# Patient Record
Sex: Female | Born: 1955
Health system: Southern US, Community
[De-identification: ages and names within clinical notes are randomized; demographics above are authoritative.]

## PROBLEM LIST (undated history)

## (undated) DIAGNOSIS — G43909 Migraine, unspecified, not intractable, without status migrainosus: Secondary | ICD-10-CM

## (undated) DIAGNOSIS — K529 Noninfective gastroenteritis and colitis, unspecified: Secondary | ICD-10-CM

## (undated) DIAGNOSIS — I10 Essential (primary) hypertension: Secondary | ICD-10-CM

## (undated) DIAGNOSIS — K5792 Diverticulitis of intestine, part unspecified, without perforation or abscess without bleeding: Secondary | ICD-10-CM

## (undated) DIAGNOSIS — D126 Benign neoplasm of colon, unspecified: Secondary | ICD-10-CM

## (undated) DIAGNOSIS — K5790 Diverticulosis of intestine, part unspecified, without perforation or abscess without bleeding: Secondary | ICD-10-CM

## (undated) DIAGNOSIS — K219 Gastro-esophageal reflux disease without esophagitis: Secondary | ICD-10-CM

## (undated) DIAGNOSIS — E049 Nontoxic goiter, unspecified: Secondary | ICD-10-CM

## (undated) DIAGNOSIS — D649 Anemia, unspecified: Secondary | ICD-10-CM

## (undated) DIAGNOSIS — F419 Anxiety disorder, unspecified: Secondary | ICD-10-CM

## (undated) DIAGNOSIS — F32A Depression, unspecified: Secondary | ICD-10-CM

## (undated) DIAGNOSIS — E042 Nontoxic multinodular goiter: Secondary | ICD-10-CM

## (undated) DIAGNOSIS — M7070 Other bursitis of hip, unspecified hip: Secondary | ICD-10-CM

## (undated) DIAGNOSIS — IMO0002 Reserved for concepts with insufficient information to code with codable children: Secondary | ICD-10-CM

## (undated) DIAGNOSIS — E039 Hypothyroidism, unspecified: Secondary | ICD-10-CM

## (undated) DIAGNOSIS — N390 Urinary tract infection, site not specified: Secondary | ICD-10-CM

## (undated) DIAGNOSIS — M8580 Other specified disorders of bone density and structure, unspecified site: Secondary | ICD-10-CM

## (undated) DIAGNOSIS — G5600 Carpal tunnel syndrome, unspecified upper limb: Secondary | ICD-10-CM

## (undated) DIAGNOSIS — M1990 Unspecified osteoarthritis, unspecified site: Secondary | ICD-10-CM

## (undated) DIAGNOSIS — Z8489 Family history of other specified conditions: Secondary | ICD-10-CM

## (undated) HISTORY — PX: TUBAL LIGATION: SHX77

## (undated) HISTORY — DX: Other specified disorders of bone density and structure, unspecified site: M85.80

## (undated) HISTORY — DX: Carpal tunnel syndrome, unspecified upper limb: G56.00

## (undated) HISTORY — DX: Other bursitis of hip, unspecified hip: M70.70

## (undated) HISTORY — PX: POLYPECTOMY: SHX149

## (undated) HISTORY — PX: COLONOSCOPY: SHX5424

## (undated) HISTORY — DX: Noninfective gastroenteritis and colitis, unspecified: K52.9

## (undated) HISTORY — DX: Benign neoplasm of colon, unspecified: D12.6

## (undated) HISTORY — DX: Nontoxic goiter, unspecified: E04.9

## (undated) HISTORY — DX: Hypothyroidism, unspecified: E03.9

## (undated) HISTORY — PX: CAUTERY OF TURBINATES: SHX1315

## (undated) HISTORY — DX: Anemia, unspecified: D64.9

## (undated) HISTORY — DX: Migraine, unspecified, not intractable, without status migrainosus: G43.909

## (undated) HISTORY — DX: Unspecified osteoarthritis, unspecified site: M19.90

## (undated) HISTORY — DX: Nontoxic multinodular goiter: E04.2

## (undated) HISTORY — PX: EXCISIONAL HEMORRHOIDECTOMY: SHX1541

## (undated) HISTORY — DX: Essential (primary) hypertension: I10

## (undated) HISTORY — PX: COLONOSCOPY: SHX174

## (undated) HISTORY — DX: Diverticulosis of intestine, part unspecified, without perforation or abscess without bleeding: K57.90

---

## 1989-09-10 HISTORY — PX: NASAL SEPTUM SURGERY: SHX37

## 1997-12-07 ENCOUNTER — Other Ambulatory Visit: Admission: RE | Admit: 1997-12-07 | Discharge: 1997-12-07 | Payer: Self-pay | Admitting: Obstetrics and Gynecology

## 1999-01-09 ENCOUNTER — Other Ambulatory Visit: Admission: RE | Admit: 1999-01-09 | Discharge: 1999-01-09 | Payer: Self-pay | Admitting: Obstetrics and Gynecology

## 2000-05-07 ENCOUNTER — Other Ambulatory Visit: Admission: RE | Admit: 2000-05-07 | Discharge: 2000-05-07 | Payer: Self-pay | Admitting: Obstetrics and Gynecology

## 2000-12-26 ENCOUNTER — Ambulatory Visit (HOSPITAL_COMMUNITY): Admission: RE | Admit: 2000-12-26 | Discharge: 2000-12-26 | Payer: Self-pay | Admitting: Obstetrics and Gynecology

## 2000-12-26 ENCOUNTER — Encounter: Payer: Self-pay | Admitting: Obstetrics and Gynecology

## 2001-01-02 ENCOUNTER — Encounter: Payer: Self-pay | Admitting: Obstetrics and Gynecology

## 2001-01-02 ENCOUNTER — Encounter: Admission: RE | Admit: 2001-01-02 | Discharge: 2001-01-02 | Payer: Self-pay | Admitting: Obstetrics and Gynecology

## 2001-09-10 HISTORY — PX: TOTAL ABDOMINAL HYSTERECTOMY: SHX209

## 2002-01-07 ENCOUNTER — Encounter: Payer: Self-pay | Admitting: Obstetrics and Gynecology

## 2002-01-07 ENCOUNTER — Ambulatory Visit (HOSPITAL_COMMUNITY): Admission: RE | Admit: 2002-01-07 | Discharge: 2002-01-07 | Payer: Self-pay | Admitting: Obstetrics and Gynecology

## 2002-12-21 ENCOUNTER — Other Ambulatory Visit: Admission: RE | Admit: 2002-12-21 | Discharge: 2002-12-21 | Payer: Self-pay | Admitting: Obstetrics and Gynecology

## 2003-02-01 ENCOUNTER — Ambulatory Visit (HOSPITAL_COMMUNITY): Admission: RE | Admit: 2003-02-01 | Discharge: 2003-02-01 | Payer: Self-pay | Admitting: Obstetrics and Gynecology

## 2003-02-01 ENCOUNTER — Encounter (INDEPENDENT_AMBULATORY_CARE_PROVIDER_SITE_OTHER): Payer: Self-pay

## 2003-04-08 ENCOUNTER — Encounter: Admission: RE | Admit: 2003-04-08 | Discharge: 2003-04-08 | Payer: Self-pay | Admitting: Otolaryngology

## 2003-04-08 ENCOUNTER — Encounter: Payer: Self-pay | Admitting: Otolaryngology

## 2003-12-10 ENCOUNTER — Encounter: Admission: RE | Admit: 2003-12-10 | Discharge: 2003-12-10 | Payer: Self-pay | Admitting: Obstetrics and Gynecology

## 2004-10-11 ENCOUNTER — Inpatient Hospital Stay (HOSPITAL_COMMUNITY): Admission: RE | Admit: 2004-10-11 | Discharge: 2004-10-14 | Payer: Self-pay | Admitting: Obstetrics and Gynecology

## 2004-10-11 ENCOUNTER — Encounter (INDEPENDENT_AMBULATORY_CARE_PROVIDER_SITE_OTHER): Payer: Self-pay | Admitting: *Deleted

## 2005-09-13 ENCOUNTER — Ambulatory Visit: Payer: Self-pay | Admitting: Gastroenterology

## 2005-09-27 ENCOUNTER — Encounter (INDEPENDENT_AMBULATORY_CARE_PROVIDER_SITE_OTHER): Payer: Self-pay | Admitting: *Deleted

## 2005-09-27 ENCOUNTER — Ambulatory Visit: Payer: Self-pay | Admitting: Gastroenterology

## 2005-10-23 ENCOUNTER — Ambulatory Visit (HOSPITAL_COMMUNITY): Admission: RE | Admit: 2005-10-23 | Discharge: 2005-10-23 | Payer: Self-pay | Admitting: Obstetrics and Gynecology

## 2006-10-08 ENCOUNTER — Ambulatory Visit (HOSPITAL_COMMUNITY): Admission: RE | Admit: 2006-10-08 | Discharge: 2006-10-08 | Payer: Self-pay | Admitting: Obstetrics and Gynecology

## 2006-10-30 ENCOUNTER — Ambulatory Visit (HOSPITAL_COMMUNITY): Admission: RE | Admit: 2006-10-30 | Discharge: 2006-10-30 | Payer: Self-pay | Admitting: Obstetrics and Gynecology

## 2006-11-21 ENCOUNTER — Encounter (INDEPENDENT_AMBULATORY_CARE_PROVIDER_SITE_OTHER): Payer: Self-pay | Admitting: Specialist

## 2006-11-21 ENCOUNTER — Other Ambulatory Visit: Admission: RE | Admit: 2006-11-21 | Discharge: 2006-11-21 | Payer: Self-pay | Admitting: Interventional Radiology

## 2006-11-21 ENCOUNTER — Encounter: Admission: RE | Admit: 2006-11-21 | Discharge: 2006-11-21 | Payer: Self-pay | Admitting: Surgery

## 2008-04-09 ENCOUNTER — Ambulatory Visit (HOSPITAL_COMMUNITY): Admission: RE | Admit: 2008-04-09 | Discharge: 2008-04-09 | Payer: Self-pay | Admitting: Obstetrics and Gynecology

## 2008-04-20 ENCOUNTER — Encounter: Admission: RE | Admit: 2008-04-20 | Discharge: 2008-04-20 | Payer: Self-pay | Admitting: Obstetrics and Gynecology

## 2009-01-14 ENCOUNTER — Telehealth: Payer: Self-pay | Admitting: Gastroenterology

## 2009-04-10 HISTORY — PX: PARTIAL KNEE ARTHROPLASTY: SHX2174

## 2009-05-02 ENCOUNTER — Inpatient Hospital Stay (HOSPITAL_COMMUNITY): Admission: RE | Admit: 2009-05-02 | Discharge: 2009-05-04 | Payer: Self-pay | Admitting: Orthopedic Surgery

## 2009-05-02 ENCOUNTER — Other Ambulatory Visit: Payer: Self-pay | Admitting: Orthopedic Surgery

## 2009-05-09 ENCOUNTER — Encounter (INDEPENDENT_AMBULATORY_CARE_PROVIDER_SITE_OTHER): Payer: Self-pay | Admitting: *Deleted

## 2009-05-26 ENCOUNTER — Ambulatory Visit: Payer: Self-pay | Admitting: Gastroenterology

## 2009-05-26 DIAGNOSIS — K219 Gastro-esophageal reflux disease without esophagitis: Secondary | ICD-10-CM | POA: Insufficient documentation

## 2009-05-31 ENCOUNTER — Encounter: Admission: RE | Admit: 2009-05-31 | Discharge: 2009-07-27 | Payer: Self-pay | Admitting: Orthopedic Surgery

## 2009-06-01 ENCOUNTER — Ambulatory Visit: Admission: RE | Admit: 2009-06-01 | Discharge: 2009-06-01 | Payer: Self-pay | Admitting: Orthopedic Surgery

## 2009-06-01 ENCOUNTER — Encounter (INDEPENDENT_AMBULATORY_CARE_PROVIDER_SITE_OTHER): Payer: Self-pay | Admitting: Orthopedic Surgery

## 2009-06-01 ENCOUNTER — Ambulatory Visit: Payer: Self-pay | Admitting: Vascular Surgery

## 2009-08-08 ENCOUNTER — Encounter: Admission: RE | Admit: 2009-08-08 | Discharge: 2009-08-08 | Payer: Self-pay | Admitting: Orthopedic Surgery

## 2009-10-25 ENCOUNTER — Encounter: Admission: RE | Admit: 2009-10-25 | Discharge: 2009-10-25 | Payer: Self-pay | Admitting: Obstetrics and Gynecology

## 2010-03-30 ENCOUNTER — Encounter: Admission: RE | Admit: 2010-03-30 | Discharge: 2010-03-30 | Payer: Self-pay | Admitting: Family Medicine

## 2010-10-01 ENCOUNTER — Encounter: Payer: Self-pay | Admitting: Obstetrics and Gynecology

## 2010-11-09 HISTORY — PX: KNEE ARTHROSCOPY: SUR90

## 2010-12-16 LAB — BASIC METABOLIC PANEL
BUN: 21 mg/dL (ref 6–23)
CO2: 33 mEq/L — ABNORMAL HIGH (ref 19–32)
Calcium: 9.4 mg/dL (ref 8.4–10.5)
GFR calc Af Amer: >60 mL/min (ref >60)
GFR calc non Af Amer: >60 mL/min (ref >60)
Glucose, Bld: 123 mg/dL — ABNORMAL HIGH (ref 70–99)
Glucose, Bld: 99 mg/dL (ref 70–99)
Potassium: 4.1 mEq/L (ref 3.5–5.1)
Sodium: 139 mEq/L (ref 135–145)
Sodium: 141 mEq/L (ref 135–145)

## 2010-12-16 LAB — CBC
HCT: 36.3 % (ref 36.0–46.0)
MCHC: 33.5 g/dL (ref 30.0–36.0)
MCV: 92 fL (ref 78.0–100.0)
Platelets: 204 10*3/uL (ref 150–400)
Platelets: 269 10*3/uL (ref 150–400)
RBC: 4.49 MIL/uL (ref 3.87–5.11)
RDW: 13.8 % (ref 11.5–15.5)
WBC: 7.6 10*3/uL (ref 4.0–10.5)

## 2010-12-16 LAB — TYPE AND SCREEN
ABO/RH(D): A POS
Antibody Screen: NEGATIVE

## 2010-12-16 LAB — DIFFERENTIAL
Basophils Absolute: 0 10*3/uL (ref 0.0–0.1)
Basophils Relative: 0 % (ref 0–1)
Eosinophils Absolute: 0.2 10*3/uL (ref 0.0–0.7)
Eosinophils Relative: 3 % (ref 0–5)
Lymphocytes Relative: 26 % (ref 12–46)
Lymphs Abs: 1.9 10*3/uL (ref 0.7–4.0)
Monocytes Relative: 6 % (ref 3–12)

## 2010-12-16 LAB — URINALYSIS, ROUTINE W REFLEX MICROSCOPIC
Glucose, UA: NEGATIVE mg/dL
Nitrite: NEGATIVE

## 2010-12-16 LAB — URINE MICROSCOPIC-ADD ON

## 2010-12-16 LAB — PROTIME-INR: INR: 1 (ref 0.00–1.49)

## 2011-01-23 NOTE — H&P (Signed)
[name], [name]                ACCOUNT NO.:  [id number]   MEDICAL RECORD NO.:  [id number]          PATIENT TYPE:  INP   LOCATION:                               FACILITY:  [facility]   PHYSICIAN:  Madlyn Frankel. Charlann Boxer, M.D.  DATE OF BIRTH:  May 23, 1956   DATE OF ADMISSION:  12/20/2008  DATE OF DISCHARGE:                              HISTORY & PHYSICAL   PROCEDURE:  A left unicondylar knee replacement of the medial  compartment.   CHIEF COMPLAINT:  Left medial knee pain.   HISTORY OF PRESENT ILLNESS:  A 55 year old female with a history of left  medial knee pain secondary to medial compartment osteoarthritis.  It has  been refractory to all conservative treatment including oral anti-  inflammatories and cortisone injection.   PRIMARY CARE PHYSICIAN:  Dr. Laurann Montana.   OTHER PHYSICIANS:  1. Dr. Rudene Christians for neurology and headaches,  2. Dr. Haroldine Laws, ENT.   PAST MEDICAL HISTORY:  1. Osteoarthritis.  2. Migraines.  3. Reflux disease.  4. Degenerative disk disease.   PAST SURGERIES:  1. Sinus surgery.  2. Hysterectomy.   FAMILY HISTORY:  Pulmonary embolism, heart disease, hypothyroidism,  cancer.   SOCIAL HISTORY:  Married.  Primary caregiver will be family in the home  postoperatively.  She is a Doctor, general practice.   DRUG ALLERGIES:  NO KNOWN DRUG ALLERGIES.   MEDICATIONS:  1. Excedrin p.r.n.  2. Celebrex 200 mg one p.o. b.i.d. x2 weeks after surgery.   REVIEW OF SYSTEMS:  See HPI.   PHYSICAL EXAMINATION:  VITAL SIGNS:  Pulse 72, respirations 16, blood  pressure 132/84.  GENERAL:  Awake, alert and oriented.  HEENT:  Normocephalic.  NECK:  Supple.  No carotid bruits.  CHEST:  Lungs clear to auscultation bilaterally.  BREASTS:  Deferred.  HEART:  S1-S2 distinct.  ABDOMEN:  Soft, nontender, bowel sounds present.  PELVIS:  Stable.  GENITOURINARY:  Deferred.  EXTREMITIES:  Left medial knee pain with recurvatum.  SKIN:  No cellulitis left lower extremity.  NEUROLOGIC:  Intact distal sensibilities.   LABORATORY DATA:  Labs, EKG, chest x-ray all pending presurgical  testing.   IMPRESSION:  Left medial knee osteoarthritis.   PLAN OF ACTION:  A left unicondylar knee replacement of the medial  compartment by Dr. Durene Romans at [facility] on December 20, 2008.  Risks and complications were discussed.   Postoperative medications were included today including aspirin for DVT  prophylaxis.     ______________________________  [name], [place]      Madlyn Frankel. Charlann Boxer, M.D.  Electronically Signed    BLM/MEDQ  D:  11/29/2008  T:  11/29/2008  Job:  [id number]   cc:   [name]. [name], M.D.  Fax: [phone]   [name]. [name], M.D.  Fax: [phone]

## 2011-01-23 NOTE — Op Note (Signed)
NAMEVIVIA, ROSENBURG                ACCOUNT NO.:  [id number]   MEDICAL RECORD NO.:  [id number]          PATIENT TYPE:  INP   LOCATION:  0004                         FACILITY:  [facility]   PHYSICIAN:  Madlyn Frankel. Charlann Boxer, M.D.  DATE OF BIRTH:  [birth year]   DATE OF PROCEDURE:  05/02/2009  DATE OF DISCHARGE:                               OPERATIVE REPORT   PREOPERATIVE DIAGNOSIS:  Left knee medial compartment osteoarthritis.   POSTOPERATIVE DIAGNOSIS:  Left knee medial compartment osteoarthritis.   PROCEDURE:  Left partial knee replacement utilizing a Biomet Oxford knee  system with a size medium femur and a left medial tibial tray with a  size 5 insert.   SURGEON:  Madlyn Frankel. Charlann Boxer, M.D.   ASSISTANT:  Dwyane Luo, PA-C   ANESTHESIA:  Spinal.   DRAINS:  One Hemovac.   COMPLICATIONS:  None.   TOURNIQUET TIME:  44 minutes at 250 mmHg.   INDICATIONS FOR PROCEDURE:  Ms. Sonn is a 55 year old female who  presented office for evaluation of bilateral knee pain, left greater  than right with radiographs revealing end-stage bone on bone changes  medially and her symptoms match this.  We discussed treatment options  since she has failed to respond conservative measures.  Option of medial  compartment arthroplasty was discussed, risks and benefits, pros and  cons discussed comparing this between the total versus partial knee  replacement.  Consent was obtained for the benefit of pain relief.   PROCEDURE IN DETAIL:  The patient was brought to the operative theater.  Once adequate anesthesia, preoperative antibiotics, Ancef administered,  the patient was positioned supine with a proximal thigh tourniquet  placed on the left leg.  Left lower extremity was positioned over the  Oxford leg holder to allow for 120 degrees of flexion.   I then pre scrubbed, prepped and draped the left lower extremity.  Time-  out was performed identifying the patient, planned procedure and  extremity.   Leg was  exsanguinated, tourniquet elevated to 250 mmHg.  A midline  incision was made just off midline.  Following initial soft tissue  debridement creation of soft tissue planes, a median arthrotomy was  made.   The patella subluxated laterally, I removed osteophytes on the distal  femur and medially as well as on the proximal tibia medially and into  the notch.  Synovectomy carried out.  Attention was first directed to  the tibia using extramedullary guide positioned just underneath the  osteophytic ridge anteriorly.  I then made a reciprocating saw followed  by an oscillating saw cut.  The cut surface seemed to fit best with a  size A tibial tray even after removing osteophytes.   With this in place and retractors removed, the 4 feeler gauge felt like  it had good tension.  Further meniscus was removed at this point.   Attention was now directed to the femur.  Femoral canal was opened with  the drill and we used an awl to open up the hole a bit more.  An  intramedullary rod was then passed.  With a size 3  feeler gauge in place  on top of the tibial tray, the posterior cutting block guide was  inserted and once it was parallel to the intramedullary rod in all  planes and centered within the middle third of the medial femoral  condyle, drill holes were made.  This was then exchanged out for the  posterior cutting block.  The posterior cut was made.   At this point, based on fact that the 4 feeler gauge felt good and from  experience, I went ahead and placed a 4 spigot.  Then the distal femur  was milled, bone was removed, osteophytes removed.  Trial reduction was  now carried out with a medium femur, A tibia and a 4-mm insert.  Both in  extension and flexion it felt just a little loose, I went ahead and  trialed with a 5.  This felt much more stable without excessive  tightening.  Thus the ligaments appeared be stable from extension and  flexion with this insert.  At this point, the trial  components removed.  I held the tibial tray in place, pinned it in position and used a  reciprocating saw to create the trough for the keel.  I then removed  remaining bone with a specific osteotome.   A keeled trial reduction was now carried out with an A tibia and medium  femur.   Using the lollipop trials, the size 5 insert had 1-2 mm of play in  flexion and the same at 20 degrees of flexion indicating stable  ligaments.   At this point all trial components removed.  We injected the medial  aspect of the joint with a were 30 mL of 0.25% Marcaine with epinephrine  and 1 mL of Toradol.   The knee was copiously irrigated with normal saline solution.  The final  components were opened after visualizing them.   Final components were cemented in position with a single batch of cement  first with the tibia.  Then the knee held at 45 degrees of flexion with  a trial insert in place.  Extruded cement was removed.  After 1-1/2  minutes the femoral component was cemented in position with cement  placed into the post hole.  Extruded cement was removed.  Once cement  had cured and excessive cement was removed throughout the knee.  The  final size 5 insert to match the medium femur on this left knee was  inserted and snapped into position with good tension.   We reirrigated the knee at this point.  I placed a medium Hemovac drain  deep.  The tourniquet was let down to 44 minutes at 250 mmHg without  significant hemostasis required.   The extensor mechanism was then reapproximated using #1 Vicryl.  The  remainder of the wound was closed with 2-0 Vicryl and running 4-0  Monocryl.  The knee was cleaned, dried and dressed sterilely with Steri-  Strips and bulky sterile wrap.  She was then brought to recovery in  stable condition, tolerating the procedure well.     Madlyn Frankel Charlann Boxer, M.D.  Electronically Signed    MDO/MEDQ  D:  05/02/2009  T:  05/02/2009  Job:  [id number]

## 2011-01-23 NOTE — Discharge Summary (Signed)
[name], [name]                ACCOUNT NO.:  [id number]   MEDICAL RECORD NO.:  [id number]          PATIENT TYPE:  INP   LOCATION:  1621                         FACILITY:  [facility]   PHYSICIAN:  [name], M.D.  DATE OF BIRTH:  1956/05/24   DATE OF ADMISSION:  05/02/2009  DATE OF DISCHARGE:  05/04/2009                               DISCHARGE SUMMARY   ADMITTING DIAGNOSES:  1. Osteoarthritis.  2. Migraines.  3. Reflux disease.  4. Perimenopausal.  5. Fibrocystic breast disease.   DISCHARGE DIAGNOSES:  1. Osteoarthritis.  2. Migraines.  3. Reflux.  4. Perimenopausal.  5. Fibrocystic breast disease.   HISTORY OF PRESENT ILLNESS:  A 55 year old female with history of left  knee pain secondary to medial compartment osteoarthritis refractory to  all conservative treatment.   CONSULTS:  None.   PROCEDURE:  Left unicondylar knee replacement in the medial compartment  with Dr. Durene Romans, assistant Robin Luo, PA-C.   LABORATORY DATA:  CBC:  Final reading showed her white blood cell of  7.6, hematocrit 30.3, platelets 204.  Metabolic:  Sodium 139, potassium  3.9, BUN 10, creatinine 0.69, glucose 123.   HOSPITAL COURSE:  The patient underwent a left medial compartment knee  replacement by Dr. Charlann [name], admitted to orthopedic floor.  Her stay was  mildly complicated with some nausea and vomiting and increased pain.  This was controlled prior to discharge.  Otherwise she remained  hemodynamically stable and afebrile.  Left knee dressing was changed on  day two with no significant drainage from the wound.  She was  weightbearing as tolerated with improved quad function and was able to  perform a straight-leg raise prior to discharge.   DISCHARGE DISPOSITION:  Discharged home in stable improved condition  with Home Health Care PT.   DISCHARGE PHYSICAL THERAPY:  Weightbearing as tolerated.   DISCHARGE DIET:  Heart-healthy.   DISCHARGE WOUND CARE:  Keep dry.   DISCHARGE  MEDICATIONS:  1. Aspirin 325 mg p.o. b.i.d. x6 weeks.  2. Robaxin 500 mg p.o. q.6.  3. Iron 325 mg p.o. t.i.d.  4. Colace 100 mg p.o. b.i.d.  5. MiraLax 17 grams p.o. daily.  6. Norco 7.5/325 one to two p.o. q.4 - 6 p.r.n.  7. Dilaudid 2 mg one p.o. q.4 - 6 p.r.n. for severe pain.  8. Celebrex 2 mg p.o. daily.  9. Detrol LA 4 mg one p.o. daily.  10.Allegra 180 mg p.o. q.p.m.  11.Fluticasone nasal spray q.p.m.  12.Metronidazole cream 0.75% apply to face twice daily.  13.Melatonin nightly.  14.Osteo Bi-Flex daily.  15.Calcium plus D daily.  16.CoQ-10 daily.  17.Black cohosh daily.  18.Multivitamin daily.  19.Pepcid daily.   DISCHARGE FOLLOWUP:  With Dr. Charlann [name] at phone number [phone] in two weeks  for wound check.     ______________________________  [name]. [name], [place]      [name], M.D.  Electronically Signed    BLM/MEDQ  D:  05/09/2009  T:  05/09/2009  Job:  [id number]   cc:   Dr. Serafina Mitchell   Juluis Mire, M.D.  Fax: [phone]

## 2011-01-23 NOTE — H&P (Signed)
NAMECAMBRY, [name]                ACCOUNT NO.:  [id number]   MEDICAL RECORD NO.:  [id number]           PATIENT TYPE:  INP   LOCATION:                               FACILITY:  [facility]   PHYSICIAN:  Madlyn Frankel. Charlann Boxer, M.D.  DATE OF BIRTH:  1955-12-03   DATE OF ADMISSION:  05/02/2009  DATE OF DISCHARGE:                              HISTORY & PHYSICAL   PROCEDURE:  Left unicondylar knee replacement of the medial compartment.   CHIEF COMPLAINT:  Left knee pain.   HISTORY OF PRESENT ILLNESS:  A 55 year old female with a history of left  knee pain secondary to medial compartment osteoarthritis.  It has been  refractory to all conservative treatment.   PRIMARY CARE PHYSICIAN:  Dr. Laurann Montana.   OTHER PHYSICIANS:  1. Dr. Richardean Chimera.  2. Dr. Meryl Crutch.  3. Dr. Haroldine Laws.   PAST SURGERIES:  1. Hysterectomy.  2. Sinus surgery.   MEDICAL HISTORY:  1. Osteoarthritis.  2. Migraines.  3. Reflux disease.  4. Perimenopausal.  5. Fibrocystic breast disease.   FAMILY HISTORY:  Coronary artery disease, cancer.   SOCIAL HISTORY:  Married.  She is a Doctor, general practice.  Nonsmoker.   PRIMARY CARE PHYSICIAN:  Family in the home postoperatively.   ALLERGIES:  Z-PAK does not still well in her stomach.   MEDICATIONS:  1. Calcium 1000 mg p.o. daily.  2. Vitamin D 1000 units daily.  3. Celebrex 200 mg 1 p.o. b.i.d. x2 weeks after surgery.   REVIEW OF SYSTEMS:  GENERAL: She has night sweats, fatigue.  HEENT: She  has headaches and ringing in ears.  DERMATOLOGY:  She has rosacea.  RESPIRATORY:  She has allergies to dust and pollen.  GI: She has  heartburn.  GENITOURINARY: She has incontinence and increased urination  at night.  MUSCULOSKELETAL: She has joint pain, joint swelling, muscular  pain and severe morning stiffness.  Otherwise, see HPI.   PHYSICAL EXAMINATION:  VITAL SIGNS:  Pulse 72, respiration 16, blood  pressure 128/84.  GENERAL:  Awake, alert and oriented.  HEENT:  Normocephalic.  NECK: Supple.  No carotid bruits.  CHEST/LUNGS:  Clear to auscultation bilaterally.  BREASTS:  Deferred.  HEART: S1, S2 distinct.  ABDOMEN:  Soft, nontender, nondistended.  Bowel sounds present.  PELVIS:  Stable.  GENITOURINARY:  Deferred.  EXTREMITIES:  Left knee medial pain with weightbearing.  SKIN:  No cellulitis.  NEUROLOGIC:  Intact to sensibilities.   LABORATORY DATA:  Labs, EKG and chest x-ray all pending presurgical  testing.   IMPRESSION:  Left knee osteoarthritis of the medial compartment.   PLAN OF ACTION:  Left unicondylar knee replacement of the medial  compartment by Dr. Charlann Boxer at [facility] on May 02, 2009.  Risks and  complications were discussed.  Questions were encouraged, answered,  reviewed.  Postoperative medications were provided including aspirin for  DVT prophylaxis, as well as Celebrex for perioperative use.     ______________________________  Yetta Glassman. Loreta Ave, [place]      Madlyn Frankel. Charlann Boxer, M.D.  Electronically Signed    BLM/MEDQ  D:  04/20/2009  T:  04/20/2009  Job:  [id number]   cc:   Stacie Acres. Cliffton Asters, M.D.  Fax: [phone]   Juluis Mire, M.D.  Fax: [phone]   Hermelinda Medicus, M.D.  Fax: [phone]

## 2011-01-26 NOTE — Op Note (Signed)
NAMENIEMAH, [name]                ACCOUNT NO.:  [id number]   MEDICAL RECORD NO.:  [id number]          PATIENT TYPE:  INP   LOCATION:  9399                          FACILITY:  [facility]   PHYSICIAN:  Duke Salvia. Marcelle Overlie, M.D.DATE OF BIRTH:  March 20, 1956   DATE OF PROCEDURE:  DATE OF DISCHARGE:                                 OPERATIVE REPORT   PREOPERATIVE DIAGNOSES:  1.  Symptomatic leiomyoma.  2.  Stress urinary incontinence.   POSTOPERATIVE DIAGNOSES:  1.  Symptomatic leiomyoma.  2.  Stress urinary incontinence.   PROCEDURE:  TAH, Birch colposuspension, placement of suprapubic catheter.   SURGEON:  Duke Salvia. Marcelle Overlie, M.D.   ASSISTANT:  Dineen Kid. Rana Snare, M.D.   ANESTHESIA:  General endotracheal.   COMPLICATIONS:  None.   DRAINS:  Suprapubic Foley catheter.   ESTIMATED BLOOD LOSS:  400 mL.   SPECIMENS REMOVED:  Uterus.   PROCEDURE AND FINDINGS:  The patient was taken to the operating room and  after an adequate level of general endotracheal anesthesia was obtained with  the patient in the frog-leg position, the abdomen was prepped and draped in  the usual manner for sterile abdominal procedures.  A three-way Foley was  positioned after prepping the vagina separately with Betadine.  A  Pfannenstiel incision was made two fingerbreadths above the symphysis,  carried down to the fascia which was incised and extended transversely.  The  rectus muscles were divided in the midline.  The peritoneum entered  superiorly without incidentally and extended in a vertical fashion.  The  upper abdomen was explored and noted to be unremarkable.  The Lenox Ahr retractor was positioned.  The bowel was packed superiorly out of  the field.  The uterus itself was 12 week size, adnexa unremarkable.  Per  her request, the ovaries were conserved since they were normal.  The  LigaSure coagulating cutting PK instrument was then used to coagulate and  cut the utero-ovarian pedicles on each  side, the round ligament on each  side.  The peritoneum was carried around the midline, advanced inferiorly  with sharp and blunt dissection below the cervicovaginal junction.  In  sequential manner, the uterine vasculature pedicle and cardinal ligament  pedicles were clamped, coagulated, and cut with the LigaSure staying close  to the uterus.  Curved Haney clamps were then placed at the cervicovaginal  pedicles and the specimen was excised.  The angles were sutured with 0  Vicryl suture in Haney fashion.  The cuff was then closed with a running-  locked 2-0 Vicryl suture.  The pelvis was irrigated with saline and  aspirated and noted to be hemostatic.  McCall's culdoplasty sutures x2 were  placed to support the posterior cul-de-sac.  This was done by picking up  uterosacral ligament, posterior peritoneum across the other uterosacral and  tied down.  Re-inspection of the operative site revealed it to be  hemostatic.  The ovaries were already well suspended.  There were no other  abnormalities noted.  The appendix was unremarkable.  Prior to closure,  sponge, needle, and instrument counts were reported as  correct x2.  The  peritoneum was closed with running 2-0 Vicryl suture.  The space of Retzius  was then developed at that point.  The surgeon's left hand was placed in the  vagina with gentle downward traction on the 30 mL Foley bulb.  The UV angle  perivaginal space was dissected on either side.  A 2-0 Vicryl suture with  double bite was placed and one additional suture lateral and tied into  Cooper's ligament on each side with good support.  After this was  accomplished, cystoscopy was carried out by filling up the bladder from  below with irrigant and making a small cystotomy incision in the fundus.  The scope was positioned.  Careful inspection revealed no suture material in  the bladder.  Through a separate incision, a pediatric Foley was placed and  brought into position.  The  cystotomy was closed with two layers of 3-0  chromic.  The rectus muscles were reapproximated with 2-0 Vicryl interrupted  sutures.  The space of Retzius was hemostatic at that point prior to  closures.  The fascia closed from laterally to midline on either side with 0  PDS suture.  On cue pain catheter was placed subfascial and subcutaneous  through separate incisions.  The subcutaneous fat was hemostatic.  Clips and  Steri-Strips used on the skin.  She tolerated this well and went to recovery  room in good condition.      RMH/MEDQ  D:  10/11/2004  T:  10/11/2004  Job:  [id number]

## 2011-01-26 NOTE — Op Note (Signed)
[name], [name]                          ACCOUNT NO.:  [id number]   MEDICAL RECORD NO.:  [id number]                   PATIENT TYPE:  AMB   LOCATION:  SDC                                  FACILITY:  [facility]   PHYSICIAN:  Juluis Mire, M.D.                DATE OF BIRTH:  1956/02/07   DATE OF PROCEDURE:  02/01/2003  DATE OF DISCHARGE:                                 OPERATIVE REPORT   PREOPERATIVE DIAGNOSES:  1. Abnormal uterine bleeding.  2. Uterine fibroids.  3. Possible endometrial polyps.   POSTOPERATIVE DIAGNOSES:  1. Abnormal uterine bleeding.  2. Uterine fibroids.  3. Possible endometrial polyps.   PATHOLOGY:  Pending.   OPERATIVE PROCEDURES:  1. Paracervical block.  2. Cervical dilation.  3. Hysteroscopy.  4. Resection of endometrium, polyps and fibroids.  5. Subsequent endometrial curetting.   SURGEON:  Juluis Mire, M.D.   ANESTHESIA:  Paracervical block with sedation.   ESTIMATED BLOOD LOSS:  Minimal.   PACKS AND DRAINS:  None.   INTRAOPERATIVE BLOOD PLACED:  None.   COMPLICATIONS:  None.   INDICATIONS FOR PROCEDURE:  This was dictated in the history and physical.   DESCRIPTION OF PROCEDURE:  The patient was taken to the OR and placed in the  supine position.  After slight sedation, the patient was placed in the  dorsal lithotomy position using the Allen stirrups.  The patient was draped  out for hysteroscopy.  A speculum was placed in the vaginal vault.  The  cervix and vagina were cleansed with Betadine.  A paracervical block was  instituted using 1% Nesacaine.  The cervix was secured with single-tooth  tenaculum.  The cervix serially dilated to a size 37 Pratt dilator.  Hysteroscope was introduced and intrauterine cavity was distended using  Sorbitol.  She had a large intrauterine cavity.  She had thickened  endometrium throughout and no definitive polyp was noted.  We did multiple  endometrial resections sent for pathological review.  She had  a definite  submucosal fibroid on the right side.  We did biopsy of this but most of it  was intramural but still had a large amount of impingement on the  intrauterine cavity.  We then subsequently obtained endometrial curettings.  There were absolutely no signs of complications or perforation.  Single-  tooth tenaculum and speculum then removed.  The patient was taken out of the  dorsal lithotomy position.  Once alert, the patient was transferred to the  recovery room in good condition.  Sponge, needle and instrument counts were  reported as correct by the circulating nurse.                                               Juluis Mire, M.D.  JSM/MEDQ  D:  02/01/2003  T:  02/01/2003  Job:  [id number]

## 2011-01-26 NOTE — H&P (Signed)
[name], [name]                ACCOUNT NO.:  [id number]   MEDICAL RECORD NO.:  [id number]          PATIENT TYPE:  INP   LOCATION:  NA                            FACILITY:  [facility]   PHYSICIAN:  Duke Salvia. Marcelle Overlie, M.D.DATE OF BIRTH:  1956/01/12   DATE OF ADMISSION:  09/13/2004  DATE OF DISCHARGE:                                HISTORY & PHYSICAL   CHIEF COMPLAINT:  Stress urinary incontinence,  symptomatic fibroids.   HISTORY OF PRESENT ILLNESS:  A 55 year old, G3, P3 has a 23, 72 and 16-year-  old, she has had a prior tubal ligation. This patient has been evaluated for  abnormal bleeding and pressure by ultrasound that showed multiple fibroids  along with prior D&C hysteroscopy for endometrial polyp with benign  pathology. Due to continued symptoms of pain and bleeding along with SUI  symptoms presents now for TAH and Burch colposuspension.  In the office, she  had cystometric's done that showed no uninhibited bladder contractions and  definite drop of the UV angle with straining.  This procedure including risk  of bleeding, infection, transfusion, adjacent organ injury, the possible  need for prolonged suprapubic catheter drainage reviewed.  Other risks  relative to wound infection, phlebitis or expected recovery time discussed  also.  Her preference is to retain her otherwise normal ovaries which was  reviewed today also.   ALLERGIES:  None.   OBSTETRICAL HISTORY:  She has had three vaginal deliveries at term.   OTHER SURGERIES:  Laparoscopic tubal ligation in 1993.   SOCIAL HISTORY:  She does not smoke or drink alcohol.   MEDICATIONS:  Multivitamin, calcium supplement.   FAMILY HISTORY:  Significant for mother for  stroke, cancer and heart attack  along with adult diabetes.   PHYSICAL EXAMINATION:  VITAL SIGNS:  Temperature 98.2, blood pressure  130/88.  HEENT:  Unremarkable.  NECK:  Supple without masses.  LUNGS:  Clear.  CARDIOVASCULAR:  Regular rate and rhythm  without murmurs, rubs or gallops.  BREASTS:  Without masses.  ABDOMEN:  Soft, flat and nontender.  PELVIC:  Normal external genitalia. Vagina and cervix were clear.  Last Pap  was April 2004 which was normal. The uterus 12-14 week size, mid position,  adnexa negative.  EXTREMITIES:  Unremarkable.  NEUROLOGIC:  Unremarkable.   IMPRESSION:  1.  Symptomatic leiomyoma.  2.  Stress urinary incontinence.   PLAN:  TAH, Burch colposuspension. Procedure and risks reviewed as above.     Rich   RMH/MEDQ  D:  09/07/2004  T:  09/07/2004  Job:  [id number]

## 2011-01-26 NOTE — Discharge Summary (Signed)
NAMEELYNOR, KALLENBERGER                ACCOUNT NO.:  [id number]   MEDICAL RECORD NO.:  [id number]          PATIENT TYPE:  INP   LOCATION:  9312                          [facility]ACILITY:  [facility]   PHYSICIAN:  Duke Salvia. Marcelle Overlie, M.D.DATE OF BIRTH:  1956/04/11   DATE OF ADMISSION:  10/11/2004  DATE OF DISCHARGE:  10/14/2004                                 DISCHARGE SUMMARY   DISCHARGE DIAGNOSES:  1.  Symptomatic leiomyoma with menorrhagia and anemia.  2.  Stress urinary incontinence.  3.  Total abdominal hysterectomy with Burch colposuspension and placement of      suprapubic catheter this admission.   SUMMARY OF HISTORY AND PHYSICAL EXAM:  Please see admission H&P for details.  Briefly, a 55 year old with symptomatic fibroids and SUL who presents for  surgical correction.   HOSPITAL COURSE:  On October 11, 2004 under general anesthesia, the patient  underwent laparotomy with TAH, conservation of both normal ovaries, Burch  colposuspension, and placement of a suprapubic catheter.  On the first  postoperative day, she was afebrile.  The suprapubic drain was draining  clear urine.  Hemoglobin preop 10.2, post op day #1 was 8.0.  She was  afebrile and her diet was slowly advanced.  By October 13, 2004 she was  ambulating without difficulty.  WBC 7900.  She started a voiding trial at  that point.  She was discharged on October 14, 2004 in the a.m., afebrile.  The clips were removed.  Steri Strips were applied.  Abdominal exam was  unremarkable.  She was ambulating without difficulty.  Her postvoid  residuals were recorded as 25-50 but I would like her to keep the suprapubic  in over the weekend and take it on Monday.  She will still record her PVR  and voided amounts at home.   Other lab data, admission UA was negative.  As stated, preop hemoglobin and  WBC 5.7 with hemoglobin 10.2.  On October 13, 2004 WBC 7.9 and hemoglobin  8.0.  Blood type is A positive.  Antibody screen was negative.   Pathology  report is still pending.   DISPOSITION:  The patient was discharged on Macrobid b.i.d. x3 days, Tylox  p.r.n. pain, iron supplement once daily, stool softener daily.  She will  return to the office on Monday to have her void log reviewed and probably  remove the suprapubic catheter at that point.  Advised regarding the  incisional drainage includes pain or bleeding or fever over 101.  She was  given specific information regarding diet, sex, and exercise.  She is  continue activity and gradually increase.      RMH/MEDQ  D:  10/14/2004  T:  10/14/2004  Job:  [id number]

## 2011-01-26 NOTE — H&P (Signed)
NAME:  [name], [name]                          ACCOUNT NO.:  [id number]   MEDICAL RECORD NO.:  [id number]                   PATIENT TYPE:  OUT   LOCATION:  [facility]                                 [facility]ACILITY:  [facility]   PHYSICIAN:  Juluis Mire, M.D.                DATE OF BIRTH:  08-03-1956   DATE OF ADMISSION:  DATE OF DISCHARGE:                                HISTORY & PHYSICAL   CHIEF COMPLAINT:  A 56 year old gravida 3, para 3, married white female  presents for hysteroscopy.   In relation to the present admission, patient has been having cycles that  have become progressively irregular over the past year.  She will skip  between two or three months and have a massively heavy flow.  The flow is so  heavy at times she is unable to leave home.  We did see resultant drop in  her hemoglobin at 10.5.  She has been followed with known uterine fibroids,  and the uterus is felt to be approximately 14 to 16 weeks in overall size on  exam.  We did do a saline-infusion ultrasound which revealed an apparent  endometrial polyp and multiple uterine fibroids, the largest measuring 5.5  cm.  In view of the abnormal bleeding and apparent endometrial polyps, she  presents now for hysteroscopic evaluation and resection.   In terms of allergies, she has no known drug allergies.  Medications include  MetroGel and multiple vitamins.   PAST MEDICAL HISTORY:  Usual childhood diseases without any significant  sequelae.   PREVIOUS SURGICAL HISTORY:  1. Laparoscopic bilateral tubal ligation.  2. Nasal reconstruction.   OBSTETRICAL HISTORY:  Three spontaneous vaginal deliveries.   FAMILY HISTORY:  There is a history of arthritis and high blood pressure.   SOCIAL HISTORY:  No tobacco or alcohol use.   REVIEW OF SYSTEMS:  Noncontributory.   PHYSICAL EXAM:  The patient is afebrile, with stable vital signs.  HEENT EXAM:  Patient is normocephalic.  Pupils equal, round, and react to  light and  accommodation.  Extraocular movements are intact.  Sclerae and conjunctivae clear.  Oropharynx clear.  NECK:  Without thyromegaly.  BREASTS:  No discrete masses.  LUNGS:  Clear.  CARDIOVASCULAR SYSTEM:  Regular rhythm and rate without murmurs or gallops.  ABDOMEN:  Benign.  No masses or organomegaly.  No tenderness.  PELVIC:  Normal external genitalia.  Vaginal mucosa is clear.  Cervix showed  a remarkable uterus of approximately 14 to 16 weeks in size.  Adnexa  unremarkable.  EXTREMITIES:  Trace edema.  NEUROLOGICAL EXAM: Grossly within normal limits.   BASIC IMPRESSION:  1. In-ovulatory cycle with endometrial polyp.  2. Uterine fibroids.   PLAN:  The patient to undergo hysteroscopic evaluation, resection of the  polyp.  The risks of surgery have been discussed, including the risks of  anesthetics.  The risk of infection.  The risk of  hemorrhage could  necessitate transfusion or possible hysterectomy.  The risk of perforation  that could lead to injury to adjacent organs requiring laparoscopy and  possible exploratory laparotomy.  The risk of deep venous thrombosis and  pulmonary embolus.  Patient expressed understanding of the indications and  risks.                                                 Juluis Mire, M.D.    JSM/MEDQ  D:  02/01/2003  T:  02/01/2003  Job:  [id number]

## 2011-03-16 ENCOUNTER — Other Ambulatory Visit: Payer: Self-pay | Admitting: Family Medicine

## 2011-03-16 DIAGNOSIS — E049 Nontoxic goiter, unspecified: Secondary | ICD-10-CM

## 2011-03-30 ENCOUNTER — Other Ambulatory Visit: Payer: Self-pay

## 2011-03-30 ENCOUNTER — Ambulatory Visit
Admission: RE | Admit: 2011-03-30 | Discharge: 2011-03-30 | Disposition: A | Discharge status: Home / Self Care | Payer: Managed Care, Other (non HMO) | Source: Ambulatory Visit | Attending: Family Medicine | Admitting: Family Medicine

## 2011-03-30 DIAGNOSIS — E049 Nontoxic goiter, unspecified: Secondary | ICD-10-CM

## 2011-04-11 DIAGNOSIS — D126 Benign neoplasm of colon, unspecified: Secondary | ICD-10-CM

## 2011-04-11 HISTORY — DX: Benign neoplasm of colon, unspecified: D12.6

## 2011-04-26 ENCOUNTER — Ambulatory Visit (INDEPENDENT_AMBULATORY_CARE_PROVIDER_SITE_OTHER): Payer: Managed Care, Other (non HMO) | Admitting: Gastroenterology

## 2011-04-26 ENCOUNTER — Encounter: Payer: Self-pay | Admitting: Gastroenterology

## 2011-04-26 VITALS — BP 146/82 | HR 80 | Ht 67.0 in | Wt 199.0 lb

## 2011-04-26 DIAGNOSIS — K219 Gastro-esophageal reflux disease without esophagitis: Secondary | ICD-10-CM

## 2011-04-26 DIAGNOSIS — R198 Other specified symptoms and signs involving the digestive system and abdomen: Secondary | ICD-10-CM

## 2011-04-26 DIAGNOSIS — R197 Diarrhea, unspecified: Secondary | ICD-10-CM

## 2011-04-26 MED ORDER — ALIGN 4 MG PO CAPS: 4.0000 | ORAL_CAPSULE | Freq: Every day | ORAL | Status: DC

## 2011-04-26 MED ORDER — PEG-KCL-NACL-NASULF-NA ASC-C 100 G PO SOLR: 1.0000 | Freq: Once | ORAL | Status: DC

## 2011-04-26 NOTE — Patient Instructions (Signed)
You have been scheduled for a Colonoscopy. Separate instructions given. Pick up your prep from your pharmacy.  Start Align samples one capsule by mouth once daily x 2 weeks.  cc: Laurann Montana, MD

## 2011-04-26 NOTE — Progress Notes (Signed)
History of Present Illness: This is a 55 year old female who noted the sudden onset of diarrhea about 6 weeks ago. She had a homemade spaghetti sauce the evening before the diarrhea began and both her and her husband developed diarrhea. Her husband symptoms resolved within 2 days. She has had persistent problems with urgent, watery, nonbloody diarrhea associated with lower abdominal cramping for about 4 weeks. For the past 2 weeks her diarrhea and abdominal cramping have substantially improved however she still has very urgent, but formed, bowel movements in the morning for the past 2 weeks. Her typical bowel pattern for the last 2 years has been a bowel movement every other day. Previously saw her in 2010 an alternating bowel pattern. Colonoscopy in 2007 was unremarkable. Blood work and stools studies obtained by Dr. Laurann Montana were all negative. She denies melena, hematochezia, weight loss, fevers, chills. She denies recent antibiotic usage or medication changes.  Current Medications, Allergies, Past Medical History, Past Surgical History, Family History and Social History were reviewed in [employer] record.  Physical Exam: General: Well developed , well nourished, no acute distress Head: Normocephalic and atraumatic Eyes:  sclerae anicteric, EOMI Ears: Normal auditory acuity Mouth: No deformity or lesions Lungs: Clear throughout to auscultation Heart: Regular rate and rhythm; no murmurs, rubs or bruits Abdomen: Soft, non tender and non distended. No masses, hepatosplenomegaly or hernias noted. Normal Bowel sounds Rectal: Deferred to colonoscopy Musculoskeletal: Symmetrical with no gross deformities  Pulses:  Normal pulses noted Extremities: No clubbing, cyanosis, edema or deformities noted Neurological: Alert oriented x 4, grossly nonfocal Psychological:  Alert and cooperative. Normal mood and affect  Assessment and Recommendations:  1. Diarrhea with persistent  urgency. This is a substantial change in her normal bowel pattern. I suspect this is a postinfectious process. Begin Align on a daily basis and minimize caffeine, milk products and high fat foods. Need to exclude inflammatory bowel disease, colorectal neoplasms and other disorders. The risks, benefits, and alternatives to colonoscopy with possible biopsy and possible polypectomy were discussed with the patient and they consent to proceed.   2. GERD. Symptoms well-controlled on lansoprazole 30 mg daily and anti-reflux measures.

## 2011-05-02 ENCOUNTER — Telehealth: Payer: Self-pay | Admitting: Gastroenterology

## 2011-05-02 NOTE — Telephone Encounter (Signed)
OK. No charge this time. Not clear about emergency reason for wisdom teeth extractions.

## 2011-05-04 ENCOUNTER — Other Ambulatory Visit: Payer: Managed Care, Other (non HMO) | Admitting: Gastroenterology

## 2011-05-10 ENCOUNTER — Telehealth: Payer: Self-pay | Admitting: Gastroenterology

## 2011-05-10 NOTE — Telephone Encounter (Signed)
Spoke with Dr. Ardell Isaacs CMA, Gaetana Michaelis, who states that pt is to drink clear liquids t/o day and follow prep exactly.  Pt told information and understanding voiced

## 2011-05-11 ENCOUNTER — Ambulatory Visit (AMBULATORY_SURGERY_CENTER): Payer: Managed Care, Other (non HMO) | Admitting: Gastroenterology

## 2011-05-11 ENCOUNTER — Other Ambulatory Visit: Payer: Managed Care, Other (non HMO) | Admitting: Gastroenterology

## 2011-05-11 ENCOUNTER — Encounter: Payer: Self-pay | Admitting: Gastroenterology

## 2011-05-11 DIAGNOSIS — R197 Diarrhea, unspecified: Secondary | ICD-10-CM

## 2011-05-11 DIAGNOSIS — R198 Other specified symptoms and signs involving the digestive system and abdomen: Secondary | ICD-10-CM

## 2011-05-11 DIAGNOSIS — D126 Benign neoplasm of colon, unspecified: Secondary | ICD-10-CM

## 2011-05-11 MED ORDER — SODIUM CHLORIDE 0.9 % IV SOLN: 500.0000 mL | INTRAVENOUS | Status: DC

## 2011-05-11 NOTE — Patient Instructions (Signed)
Please refer to blue and green discharge instruction sheets. Restora samples given for trial.

## 2011-05-15 ENCOUNTER — Telehealth: Payer: Self-pay | Admitting: *Deleted

## 2011-05-15 NOTE — Telephone Encounter (Signed)
Follow up Call- Patient questions:  Do you have a fever, pain , or abdominal swelling? no Pain Score  0 *  Have you tolerated food without any problems? yes  Have you been able to return to your normal activities? yes  Do you have any questions about your discharge instructions: Diet   no Medications  no Follow up visit  no  Do you have questions or concerns about your Care? no  Actions: * If pain score is 4 or above: No action needed, pain <4.        Per husband.

## 2011-05-17 ENCOUNTER — Encounter: Payer: Self-pay | Admitting: Gastroenterology

## 2011-06-11 ENCOUNTER — Ambulatory Visit: Payer: Managed Care, Other (non HMO) | Admitting: Gastroenterology

## 2011-11-05 ENCOUNTER — Ambulatory Visit: Payer: Managed Care, Other (non HMO) | Attending: Orthopedic Surgery | Admitting: Physical Therapy

## 2011-11-05 DIAGNOSIS — M25659 Stiffness of unspecified hip, not elsewhere classified: Secondary | ICD-10-CM | POA: Insufficient documentation

## 2011-11-05 DIAGNOSIS — M25559 Pain in unspecified hip: Secondary | ICD-10-CM | POA: Insufficient documentation

## 2011-11-05 DIAGNOSIS — IMO0001 Reserved for inherently not codable concepts without codable children: Secondary | ICD-10-CM | POA: Insufficient documentation

## 2011-11-08 ENCOUNTER — Ambulatory Visit: Payer: Managed Care, Other (non HMO) | Admitting: Physical Therapy

## 2011-11-12 ENCOUNTER — Ambulatory Visit: Payer: Managed Care, Other (non HMO) | Attending: Orthopedic Surgery | Admitting: Physical Therapy

## 2011-11-12 DIAGNOSIS — M25659 Stiffness of unspecified hip, not elsewhere classified: Secondary | ICD-10-CM | POA: Insufficient documentation

## 2011-11-12 DIAGNOSIS — IMO0001 Reserved for inherently not codable concepts without codable children: Secondary | ICD-10-CM | POA: Insufficient documentation

## 2011-11-12 DIAGNOSIS — M25559 Pain in unspecified hip: Secondary | ICD-10-CM | POA: Insufficient documentation

## 2011-11-14 ENCOUNTER — Ambulatory Visit: Payer: Managed Care, Other (non HMO) | Admitting: Physical Therapy

## 2011-11-15 ENCOUNTER — Ambulatory Visit: Payer: Managed Care, Other (non HMO) | Admitting: Physical Therapy

## 2011-11-19 ENCOUNTER — Ambulatory Visit: Payer: Managed Care, Other (non HMO) | Admitting: Physical Therapy

## 2011-11-22 ENCOUNTER — Ambulatory Visit: Payer: Managed Care, Other (non HMO) | Admitting: Physical Therapy

## 2011-11-26 ENCOUNTER — Ambulatory Visit: Payer: Managed Care, Other (non HMO) | Admitting: Physical Therapy

## 2011-11-29 ENCOUNTER — Ambulatory Visit: Payer: Managed Care, Other (non HMO) | Admitting: Physical Therapy

## 2012-03-28 ENCOUNTER — Other Ambulatory Visit (HOSPITAL_COMMUNITY): Payer: Self-pay | Admitting: Obstetrics and Gynecology

## 2012-03-28 ENCOUNTER — Other Ambulatory Visit: Payer: Self-pay | Admitting: Family Medicine

## 2012-03-28 DIAGNOSIS — Z1231 Encounter for screening mammogram for malignant neoplasm of breast: Secondary | ICD-10-CM

## 2012-03-28 DIAGNOSIS — E049 Nontoxic goiter, unspecified: Secondary | ICD-10-CM

## 2012-04-02 ENCOUNTER — Ambulatory Visit
Admission: RE | Admit: 2012-04-02 | Discharge: 2012-04-02 | Disposition: A | Discharge status: Home / Self Care | Payer: Managed Care, Other (non HMO) | Source: Ambulatory Visit | Attending: Family Medicine | Admitting: Family Medicine

## 2012-04-02 DIAGNOSIS — E049 Nontoxic goiter, unspecified: Secondary | ICD-10-CM

## 2012-04-08 ENCOUNTER — Telehealth (INDEPENDENT_AMBULATORY_CARE_PROVIDER_SITE_OTHER): Payer: Self-pay

## 2012-04-08 NOTE — Telephone Encounter (Signed)
Pt advised Dr Gerrit Friends is out of office until Monday. Pt advised I will have DR Gerkin review her u/s report. Pt wants ov and surgery before 9-3 when her insurance runs out if surgery is needed. Pt states she does not have difficulty swallowing or breathing from any compressive symptoms.

## 2012-04-15 ENCOUNTER — Ambulatory Visit (HOSPITAL_COMMUNITY): Payer: Managed Care, Other (non HMO)

## 2012-04-17 ENCOUNTER — Encounter (INDEPENDENT_AMBULATORY_CARE_PROVIDER_SITE_OTHER): Payer: Self-pay | Admitting: Surgery

## 2012-04-17 ENCOUNTER — Ambulatory Visit (INDEPENDENT_AMBULATORY_CARE_PROVIDER_SITE_OTHER): Payer: Managed Care, Other (non HMO) | Admitting: Surgery

## 2012-04-17 VITALS — BP 160/90 | HR 72 | Temp 97.6°F | Resp 18 | Ht 66.0 in | Wt 204.0 lb

## 2012-04-17 DIAGNOSIS — E042 Nontoxic multinodular goiter: Secondary | ICD-10-CM

## 2012-04-17 NOTE — Patient Instructions (Signed)
[facility] [phone]  THYROID & PARATHYROID SURGERY -- POST OP INSTRUCTIONS  Always review your discharge instruction sheet from the facility where your surgery was performed.  1. A prescription for pain medication may be given to you upon discharge.  Take your pain medication as prescribed.  If narcotic pain medicine is not needed, then you may take acetaminophen (Tylenol) or ibuprofen (Advil) as needed. 2. Take your usually prescribed medications unless otherwise directed. 3. If you need a refill on your pain medication, please contact your pharmacy. They will contact our office to request authorization.  Prescriptions will not be processed after 5 pm or on weekends. 4. Start with a light diet upon arrival home, such as soup and crackers or toast.  Be sure to drink plenty of fluids daily.  Resume your normal diet the day after surgery. 5. Most patients will experience some swelling and bruising on the chest and neck area.  Ice packs will help.  Swelling and bruising can take several days to resolve.  6. It is common to experience some constipation if taking pain medication after surgery.  Increasing fluid intake and taking a stool softener will usually help or prevent this problem.  A mild laxative (Milk of Magnesia or Miralax) should be taken according to package directions if there are no bowel movements after 48 hours. 7. You may remove your bandages 24-48 hours after surgery, and you may shower at that time.  You have steri-strips (small skin tapes) in place directly over the incision.  These strips should be left on the skin for 7-10 days and then removed. 8. You may resume regular (light) daily activities beginning the next day-such as daily self-care, walking, climbing stairs-gradually increasing activities as tolerated.  You may have sexual intercourse when it is comfortable.  Refrain from any heavy lifting or straining until approved by your doctor.  You may drive when  you no longer are taking prescription pain medication, you can comfortably wear a seatbelt, and you can safely maneuver your car and apply brakes. 9. You should see your doctor in the office for a follow-up appointment approximately two to three weeks after your surgery.  Make sure that you call for this appointment within a day or two after you arrive home to insure a convenient appointment time.  WHEN TO CALL YOUR DOCTOR: 1. Fever greater than 101.5 2. Inability to urinate 3. Nausea and/or vomiting - persistent 4. Extreme swelling or bruising 5. Continued bleeding from incision 6. Increased pain, redness, or drainage from the incision 7. Difficulty swallowing or breathing 8. Muscle cramping or spasms 9. Numbness or tingling in hands or around lips  The clinic staff is available to answer your questions during regular business hours.  Please don't hesitate to call and ask to speak to one of the nurses if you have concerns. [facility] [phone]  [email or web address]   

## 2012-04-17 NOTE — Progress Notes (Signed)
General Surgery - [facility]  Chief Complaint  Patient presents with  . New Evaluation    eval thyroid goiter, enlarging thyroid nodule - referral from Dr. Cynthia White    HISTORY: The patient is a 56-year-old white female previously followed in my practice for multinodular thyroid goiter. She was last seen in 2009. She has been followed by her primary care physician and her gynecologist. She has had sequential ultrasound scanning. Her most recent study from July 2013 shows interval enlargement of the dominant nodule in the lower pole of the left thyroid lobe. This now measures 5.5 cm compared to a measurement of 3.3 cm when she was evaluated in 2009. Also there is a 7 mm nodule in the right thyroid lobe with microcalcifications. Patient has had previous fine needle aspiration biopsy of a dominant left lobe nodule and 2008. This showed a follicular lesion without atypia.  Patient has had thyroid function test performed by her primary care physician. By report those levels are normal. Patient has never been on thyroid medication.  Patient denies any significant compressive symptoms.  Patient does have a family history of thyroid disease in her mother and her sister. There is no family history of thyroid cancer.   Past Medical History  Diagnosis Date  . Diverticulosis   . Anemia   . Multiple thyroid nodules   . Hypothyroidism   . Migraines   . Allergic rhinitis   . Acne rosacea   . Osteoarthritis   . Overactive bladder   . Obesity   . Hypertension   . Goiter   . Hip bursitis   . Tubular adenoma of colon 04/2011     Current Outpatient Prescriptions  Medication Sig Dispense Refill  . AMBULATORY NON FORMULARY MEDICATION Medication Name: CoHosh 541 mg. Take 1 capsule once daily       . Calcium Carbonate (CALCIUM 500 PO) Take 1 tablet by mouth daily.        . fexofenadine (ALLEGRA) 180 MG tablet Take 180 mg by mouth daily.        . Flaxseed, Linseed, (FLAX SEED  OIL) 1000 MG CAPS Take 1 capsule by mouth daily.        . fluticasone (FLONASE) 50 MCG/ACT nasal spray Place 2 sprays into the nose daily.        . lansoprazole (PREVACID) 30 MG capsule Take 30 mg by mouth daily.        . losartan (COZAAR) 50 MG tablet Take 50 mg by mouth daily.       . metroNIDAZOLE (METROCREAM) 0.75 % cream Apply 1 application topically daily.        . Multiple Vitamin (MULTIVITAMIN) capsule Take 1 capsule by mouth daily.        . Probiotic Product (ALIGN) 4 MG CAPS Take 4 capsules by mouth daily.  14 capsule  0  . tolterodine (DETROL LA) 4 MG 24 hr capsule Take 4 mg by mouth daily.           Allergies  Allergen Reactions  . Cephalexin      Family History  Problem Relation Age of Onset  . Colon cancer Maternal Grandfather   . Diabetes Mother   . Heart disease Mother   . Breast cancer Mother   . Pulmonary embolism Father   . Hypertension Sister      History   Social History  . Marital Status: Married    Spouse Name: N/A    Number of Children: 3  .   Years of Education: N/A   Occupational History  . Speech pathology    Social History Main Topics  . Smoking status: Never Smoker   . Smokeless tobacco: Never Used  . Alcohol Use: No  . Drug Use: No  . Sexually Active: None   Other Topics Concern  . None   Social History Narrative  . None     REVIEW OF SYSTEMS - PERTINENT POSITIVES ONLY: Denies compressive symptoms. Denies tremors. Denies palpitations. Denies any new masses. Denies any pain.  EXAM: Filed Vitals:   04/17/12 1331  BP: 160/90  Pulse: 72  Temp: 97.6 F (36.4 C)  Resp: 18    HEENT: normocephalic; pupils equal and reactive; sclerae clear; dentition good; mucous membranes moist NECK:  Dominant nodule occupying a large portion of the left thyroid lobe and extending to the left clavicle, nontender, slight tracheal deviation to the right; asymmetric on extension; no palpable anterior or posterior cervical lymphadenopathy; no  supraclavicular masses; no tenderness CHEST: clear to auscultation bilaterally without rales, rhonchi, or wheezes CARDIAC: regular rate and rhythm without significant murmur; peripheral pulses are full EXT:  non-tender without edema; no deformity NEURO: no gross focal deficits; no sign of tremor   LABORATORY RESULTS: See [facility] (CHL-Epic) for most recent results   RADIOLOGY RESULTS: See [facility] (CHL-Epic) for most recent results   IMPRESSION: #1 multinodular thyroid goiter, nontoxic #2 enlarging dominant left thyroid nodule, 5.5 cm, without compressive symptoms #3 right thyroid nodule, 7 mm, with microcalcifications  PLAN: I had a lengthy discussion with the patient and her husband in my office today. We reviewed all of the above findings. I offered them continued close observation, fine needle aspiration biopsy, or total thyroidectomy. After careful consideration, they would like to proceed with total thyroidectomy. We have discussed the procedure at length. We have discussed cosmetic results to be anticipated. We have discussed potential complications including recurrent laryngeal nerve injury and injury to parathyroid glands. We have discussed the hospital stay and he returned to physical activity and work. They understand and wish to proceed with surgery in the near future.  The risks and benefits of the procedure have been discussed at length with the patient.  The patient understands the proposed procedure, potential alternative treatments, and the course of recovery to be expected.  All of the patient's questions have been answered at this time.  The patient wishes to proceed with surgery.  Gaby Harney M. Syniah Berne, MD, FACS General & Endocrine Surgery [facility]   Visit Diagnoses: 1. Multinodular goiter (nontoxic)     Primary Care Physician: WHITE,CYNTHIA S, MD   

## 2012-04-22 ENCOUNTER — Other Ambulatory Visit (INDEPENDENT_AMBULATORY_CARE_PROVIDER_SITE_OTHER): Payer: Self-pay

## 2012-04-23 ENCOUNTER — Telehealth (INDEPENDENT_AMBULATORY_CARE_PROVIDER_SITE_OTHER): Payer: Self-pay

## 2012-04-23 NOTE — Telephone Encounter (Signed)
PO appt card and lab slip mailed to pt for labs and po appt time.

## 2012-04-25 ENCOUNTER — Ambulatory Visit (HOSPITAL_COMMUNITY)
Admission: RE | Admit: 2012-04-25 | Discharge: 2012-04-25 | Disposition: A | Discharge status: Home / Self Care | Payer: Managed Care, Other (non HMO) | Source: Ambulatory Visit | Attending: Obstetrics and Gynecology | Admitting: Obstetrics and Gynecology

## 2012-04-25 ENCOUNTER — Encounter (HOSPITAL_COMMUNITY): Payer: Self-pay

## 2012-04-25 DIAGNOSIS — Z1231 Encounter for screening mammogram for malignant neoplasm of breast: Secondary | ICD-10-CM | POA: Insufficient documentation

## 2012-04-30 NOTE — Patient Instructions (Signed)
20 [name]  04/30/2012   Your procedure is scheduled on: 05/08/12 0930am-1130am  Report to [facility] at 0700 AM.  Call this number if you have problems the morning of surgery: [phone]   Remember:   Do not eat food:After Midnight.  May have clear liquids:until Midnight .    Take these medicines the morning of surgery with A SIP OF WATER   Do not wear jewelry, make-up or nail polish.  Do not wear lotions, powders, or perfumes. .  Do not shave 48 hours prior to surgery.   Do not bring valuables to the hospital.  Contacts, dentures or bridgework may not be worn into surgery.  Leave suitcase in the car. After surgery it may be brought to your room.  For patients admitted to the hospital, checkout time is 11:00 AM the day of discharge.       Special Instructions: CHG Shower Use Special Wash: 1/2 bottle night before surgery and 1/2 bottle morning of surgery. shower chin to toes with CHg.  Wash face and private parts with regular soap.   Please read over the following fact sheets that you were given: MRSA Information, coughing and deep breathing exercises , leg exercises

## 2012-05-01 ENCOUNTER — Ambulatory Visit (HOSPITAL_COMMUNITY)
Admission: RE | Admit: 2012-05-01 | Discharge: 2012-05-01 | Disposition: A | Discharge status: Home / Self Care | Payer: Managed Care, Other (non HMO) | Source: Ambulatory Visit | Attending: Surgery | Admitting: Surgery

## 2012-05-01 ENCOUNTER — Encounter (HOSPITAL_COMMUNITY): Payer: Self-pay

## 2012-05-01 ENCOUNTER — Encounter (HOSPITAL_COMMUNITY)
Admission: RE | Admit: 2012-05-01 | Discharge: 2012-05-01 | Disposition: A | Discharge status: Home / Self Care | Payer: Managed Care, Other (non HMO) | Source: Ambulatory Visit | Attending: Surgery | Admitting: Surgery

## 2012-05-01 DIAGNOSIS — Z01818 Encounter for other preprocedural examination: Secondary | ICD-10-CM | POA: Insufficient documentation

## 2012-05-01 DIAGNOSIS — Z01812 Encounter for preprocedural laboratory examination: Secondary | ICD-10-CM | POA: Insufficient documentation

## 2012-05-01 DIAGNOSIS — I10 Essential (primary) hypertension: Secondary | ICD-10-CM | POA: Insufficient documentation

## 2012-05-01 HISTORY — DX: Gastro-esophageal reflux disease without esophagitis: K21.9

## 2012-05-01 LAB — BASIC METABOLIC PANEL
BUN: 21 mg/dL (ref 6–23)
Chloride: 102 mEq/L (ref 96–112)
Creatinine, Ser: 0.76 mg/dL (ref 0.50–1.10)
GFR calc Af Amer: >90 mL/min (ref >90)
Glucose, Bld: 91 mg/dL (ref 70–99)

## 2012-05-01 LAB — CBC
HCT: 40.5 % (ref 36.0–46.0)
Hemoglobin: 14 g/dL (ref 12.0–15.0)
MCV: 88.8 fL (ref 78.0–100.0)
RDW: 12.9 % (ref 11.5–15.5)
WBC: 6.3 10*3/uL (ref 4.0–10.5)

## 2012-05-01 NOTE — Progress Notes (Signed)
Quick Note:  These results are acceptable for scheduled surgery.  Harvy Riera M. Tona Qualley, MD, FACS [facility] Office: [phone]   ______ 

## 2012-05-02 NOTE — Progress Notes (Signed)
Quick Note:  These results are acceptable for scheduled surgery.  Kyera Felan M. Maribell Demeo, MD, FACS [facility] Office: [phone]   ______ 

## 2012-05-08 ENCOUNTER — Encounter (HOSPITAL_COMMUNITY): Payer: Self-pay | Admitting: Anesthesiology

## 2012-05-08 ENCOUNTER — Ambulatory Visit (HOSPITAL_COMMUNITY): Payer: Managed Care, Other (non HMO) | Admitting: Anesthesiology

## 2012-05-08 ENCOUNTER — Encounter (HOSPITAL_COMMUNITY): Payer: Self-pay

## 2012-05-08 ENCOUNTER — Encounter (HOSPITAL_COMMUNITY): Payer: Self-pay | Admitting: *Deleted

## 2012-05-08 ENCOUNTER — Encounter (HOSPITAL_COMMUNITY)
Admission: RE | Disposition: A | Discharge status: Home / Self Care | Payer: Self-pay | Source: Ambulatory Visit | Attending: Surgery

## 2012-05-08 ENCOUNTER — Observation Stay (HOSPITAL_COMMUNITY)
Admission: RE | Admit: 2012-05-08 | Discharge: 2012-05-09 | Disposition: A | Discharge status: Home / Self Care | Payer: Managed Care, Other (non HMO) | Source: Ambulatory Visit | Attending: Surgery | Admitting: Surgery

## 2012-05-08 DIAGNOSIS — Z79899 Other long term (current) drug therapy: Secondary | ICD-10-CM | POA: Insufficient documentation

## 2012-05-08 DIAGNOSIS — E039 Hypothyroidism, unspecified: Secondary | ICD-10-CM | POA: Insufficient documentation

## 2012-05-08 DIAGNOSIS — E042 Nontoxic multinodular goiter: Secondary | ICD-10-CM

## 2012-05-08 DIAGNOSIS — C73 Malignant neoplasm of thyroid gland: Principal | ICD-10-CM | POA: Insufficient documentation

## 2012-05-08 DIAGNOSIS — I10 Essential (primary) hypertension: Secondary | ICD-10-CM | POA: Insufficient documentation

## 2012-05-08 HISTORY — PX: THYROIDECTOMY: SHX17

## 2012-05-08 SURGERY — THYROIDECTOMY
Anesthesia: General | Site: Neck | Wound class: Clean

## 2012-05-08 MED ORDER — HYDROMORPHONE HCL PF 1 MG/ML IJ SOLN
0.2500 mg | INTRAMUSCULAR | Status: DC | PRN
  Administered 2012-05-08: 0.25 mg via INTRAVENOUS

## 2012-05-08 MED ORDER — NEOSTIGMINE METHYLSULFATE 1 MG/ML IJ SOLN
INTRAMUSCULAR | Status: DC | PRN
  Administered 2012-05-08: 4 mg via INTRAVENOUS

## 2012-05-08 MED ORDER — HYDROMORPHONE HCL PF 1 MG/ML IJ SOLN
1.0000 mg | INTRAMUSCULAR | Status: DC | PRN
  Administered 2012-05-08 (×2): 1 mg via INTRAVENOUS
  Filled 2012-05-08 (×3): qty 1

## 2012-05-08 MED ORDER — ACETAMINOPHEN 325 MG PO TABS: 650.0000 mg | ORAL_TABLET | ORAL | Status: DC | PRN

## 2012-05-08 MED ORDER — SUCCINYLCHOLINE CHLORIDE 20 MG/ML IJ SOLN
INTRAMUSCULAR | Status: DC | PRN
  Administered 2012-05-08: 80 mg via INTRAVENOUS

## 2012-05-08 MED ORDER — LACTATED RINGERS IV SOLN: INTRAVENOUS | Status: DC

## 2012-05-08 MED ORDER — PROMETHAZINE HCL 25 MG/ML IJ SOLN
INTRAMUSCULAR | Status: AC
  Filled 2012-05-08: qty 1

## 2012-05-08 MED ORDER — ONDANSETRON HCL 4 MG/2ML IJ SOLN
4.0000 mg | Freq: Four times a day (QID) | INTRAMUSCULAR | Status: DC | PRN
  Administered 2012-05-08: 4 mg via INTRAVENOUS
  Filled 2012-05-08: qty 2

## 2012-05-08 MED ORDER — CISATRACURIUM BESYLATE (PF) 10 MG/5ML IV SOLN
INTRAVENOUS | Status: DC | PRN
  Administered 2012-05-08: 6 mg via INTRAVENOUS
  Administered 2012-05-08: 2 mg via INTRAVENOUS
  Administered 2012-05-08: 1 mg via INTRAVENOUS

## 2012-05-08 MED ORDER — DIPHENHYDRAMINE HCL 25 MG PO CAPS
25.0000 mg | ORAL_CAPSULE | ORAL | Status: DC | PRN
  Administered 2012-05-08 – 2012-05-09 (×2): 25 mg via ORAL
  Filled 2012-05-08 (×2): qty 1

## 2012-05-08 MED ORDER — LACTATED RINGERS IV SOLN
INTRAVENOUS | Status: DC
  Administered 2012-05-08: 10:00:00 via INTRAVENOUS
  Administered 2012-05-08: 1000 mL via INTRAVENOUS

## 2012-05-08 MED ORDER — CIPROFLOXACIN IN D5W 400 MG/200ML IV SOLN
400.0000 mg | INTRAVENOUS | Status: AC
  Administered 2012-05-08: 400 mg via INTRAVENOUS

## 2012-05-08 MED ORDER — CALCIUM CARBONATE 1250 (500 CA) MG PO TABS
2.0000 | ORAL_TABLET | Freq: Three times a day (TID) | ORAL | Status: DC
  Administered 2012-05-08 – 2012-05-09 (×2): 1000 mg via ORAL
  Filled 2012-05-08 (×4): qty 2

## 2012-05-08 MED ORDER — LOSARTAN POTASSIUM 50 MG PO TABS
50.0000 mg | ORAL_TABLET | Freq: Every morning | ORAL | Status: DC
  Administered 2012-05-08 – 2012-05-09 (×2): 50 mg via ORAL
  Filled 2012-05-08 (×2): qty 1

## 2012-05-08 MED ORDER — DEXAMETHASONE SODIUM PHOSPHATE 4 MG/ML IJ SOLN
INTRAMUSCULAR | Status: DC | PRN
  Administered 2012-05-08: 10 mg via INTRAVENOUS

## 2012-05-08 MED ORDER — HYDROCODONE-ACETAMINOPHEN 5-325 MG PO TABS
1.0000 | ORAL_TABLET | ORAL | Status: DC | PRN
  Administered 2012-05-08 – 2012-05-09 (×3): 2 via ORAL
  Filled 2012-05-08 (×3): qty 2

## 2012-05-08 MED ORDER — CIPROFLOXACIN IN D5W 400 MG/200ML IV SOLN
INTRAVENOUS | Status: AC
  Filled 2012-05-08: qty 200

## 2012-05-08 MED ORDER — FLUTICASONE PROPIONATE 50 MCG/ACT NA SUSP
2.0000 | Freq: Every day | NASAL | Status: DC
  Administered 2012-05-09: 2 via NASAL
  Filled 2012-05-08: qty 16

## 2012-05-08 MED ORDER — PROMETHAZINE HCL 25 MG/ML IJ SOLN
6.2500 mg | INTRAMUSCULAR | Status: AC | PRN
  Administered 2012-05-08 (×2): 6.25 mg via INTRAVENOUS

## 2012-05-08 MED ORDER — ONDANSETRON HCL 4 MG PO TABS: 4.0000 mg | ORAL_TABLET | Freq: Four times a day (QID) | ORAL | Status: DC | PRN

## 2012-05-08 MED ORDER — FESOTERODINE FUMARATE ER 4 MG PO TB24
4.0000 mg | ORAL_TABLET | Freq: Every day | ORAL | Status: DC
  Administered 2012-05-09: 4 mg via ORAL
  Filled 2012-05-08: qty 1

## 2012-05-08 MED ORDER — LIDOCAINE HCL 4 % MT SOLN
OROMUCOSAL | Status: DC | PRN
  Administered 2012-05-08: 4 mL via TOPICAL

## 2012-05-08 MED ORDER — MEPERIDINE HCL 50 MG/ML IJ SOLN: 6.2500 mg | INTRAMUSCULAR | Status: DC | PRN

## 2012-05-08 MED ORDER — KCL IN DEXTROSE-NACL 20-5-0.45 MEQ/L-%-% IV SOLN
INTRAVENOUS | Status: DC
  Administered 2012-05-08 – 2012-05-09 (×2): via INTRAVENOUS
  Filled 2012-05-08 (×3): qty 1000

## 2012-05-08 MED ORDER — MIDAZOLAM HCL 5 MG/5ML IJ SOLN
INTRAMUSCULAR | Status: DC | PRN
  Administered 2012-05-08: 2 mg via INTRAVENOUS

## 2012-05-08 MED ORDER — BIOTENE DRY MOUTH MT LIQD
15.0000 mL | Freq: Two times a day (BID) | OROMUCOSAL | Status: DC
  Administered 2012-05-08: 15 mL via OROMUCOSAL

## 2012-05-08 MED ORDER — PROPOFOL 10 MG/ML IV BOLUS
INTRAVENOUS | Status: DC | PRN
  Administered 2012-05-08: 180 mg via INTRAVENOUS

## 2012-05-08 MED ORDER — GLYCOPYRROLATE 0.2 MG/ML IJ SOLN
INTRAMUSCULAR | Status: DC | PRN
  Administered 2012-05-08: 0.6 mg via INTRAVENOUS

## 2012-05-08 MED ORDER — ONDANSETRON HCL 4 MG/2ML IJ SOLN
INTRAMUSCULAR | Status: DC | PRN
  Administered 2012-05-08: 4 mg via INTRAVENOUS

## 2012-05-08 MED ORDER — HYDROMORPHONE HCL PF 1 MG/ML IJ SOLN
INTRAMUSCULAR | Status: AC
  Filled 2012-05-08: qty 1

## 2012-05-08 MED ORDER — 0.9 % SODIUM CHLORIDE (POUR BTL) OPTIME
TOPICAL | Status: DC | PRN
  Administered 2012-05-08: 1000 mL

## 2012-05-08 MED ORDER — MORPHINE SULFATE 2 MG/ML IJ SOLN
2.0000 mg | INTRAMUSCULAR | Status: DC | PRN
  Administered 2012-05-08: 2 mg via INTRAVENOUS
  Filled 2012-05-08: qty 1

## 2012-05-08 MED ORDER — SUFENTANIL CITRATE 50 MCG/ML IV SOLN
INTRAVENOUS | Status: DC | PRN
  Administered 2012-05-08: 20 ug via INTRAVENOUS
  Administered 2012-05-08 (×2): 10 ug via INTRAVENOUS

## 2012-05-08 SURGICAL SUPPLY — 40 items
APL SKNCLS STERI-STRIP NONHPOA (GAUZE/BANDAGES/DRESSINGS) ×1
ATTRACTOMAT 16X20 MAGNETIC DRP (DRAPES) ×2 IMPLANT
BENZOIN TINCTURE PRP APPL 2/3 (GAUZE/BANDAGES/DRESSINGS) ×2 IMPLANT
BLADE HEX COATED 2.75 (ELECTRODE) ×2 IMPLANT
BLADE SURG 15 STRL LF DISP TIS (BLADE) ×1 IMPLANT
BLADE SURG 15 STRL SS (BLADE) ×2
CANISTER SUCTION 2500CC (MISCELLANEOUS) ×2 IMPLANT
CHLORAPREP W/TINT 10.5 ML (MISCELLANEOUS) ×2 IMPLANT
CLIP TI MEDIUM 6 (CLIP) ×5 IMPLANT
CLIP TI WIDE RED SMALL 6 (CLIP) ×6 IMPLANT
CLOTH BEACON ORANGE TIMEOUT ST (SAFETY) ×2 IMPLANT
CLSR STERI-STRIP ANTIMIC 1/2X4 (GAUZE/BANDAGES/DRESSINGS) ×1 IMPLANT
DISSECTOR ROUND CHERRY 3/8 STR (MISCELLANEOUS) IMPLANT
DRAPE PED LAPAROTOMY (DRAPES) ×2 IMPLANT
DRESSING SURGICEL FIBRLLR 1X2 (HEMOSTASIS) ×1 IMPLANT
DRSG SURGICEL FIBRILLAR 1X2 (HEMOSTASIS) ×2
ELECT REM PT RETURN 9FT ADLT (ELECTROSURGICAL) ×2
ELECTRODE REM PT RTRN 9FT ADLT (ELECTROSURGICAL) ×1 IMPLANT
GAUZE SPONGE 4X4 16PLY XRAY LF (GAUZE/BANDAGES/DRESSINGS) ×2 IMPLANT
GLOVE SURG ORTHO 8.0 STRL STRW (GLOVE) ×2 IMPLANT
GOWN STRL NON-REIN LRG LVL3 (GOWN DISPOSABLE) ×2 IMPLANT
GOWN STRL REIN XL XLG (GOWN DISPOSABLE) ×4 IMPLANT
KIT BASIN OR (CUSTOM PROCEDURE TRAY) ×2 IMPLANT
NS IRRIG 1000ML POUR BTL (IV SOLUTION) ×2 IMPLANT
PACK BASIC VI WITH GOWN DISP (CUSTOM PROCEDURE TRAY) ×2 IMPLANT
PENCIL BUTTON HOLSTER BLD 10FT (ELECTRODE) ×2 IMPLANT
SHEARS HARMONIC 9CM CVD (BLADE) ×2 IMPLANT
SPONGE GAUZE 4X4 12PLY (GAUZE/BANDAGES/DRESSINGS) ×1 IMPLANT
STAPLER VISISTAT 35W (STAPLE) ×1 IMPLANT
STRIP CLOSURE SKIN 1/2X4 (GAUZE/BANDAGES/DRESSINGS) ×2 IMPLANT
SUT MNCRL AB 4-0 PS2 18 (SUTURE) ×2 IMPLANT
SUT SILK 2 0 (SUTURE) ×2
SUT SILK 2-0 18XBRD TIE 12 (SUTURE) ×1 IMPLANT
SUT SILK 3 0 (SUTURE)
SUT SILK 3-0 18XBRD TIE 12 (SUTURE) IMPLANT
SUT VIC AB 3-0 SH 18 (SUTURE) ×3 IMPLANT
SYR BULB IRRIGATION 50ML (SYRINGE) ×2 IMPLANT
TAPE CLOTH SURG 4X10 WHT LF (GAUZE/BANDAGES/DRESSINGS) ×1 IMPLANT
TOWEL OR 17X26 10 PK STRL BLUE (TOWEL DISPOSABLE) ×2 IMPLANT
YANKAUER SUCT BULB TIP 10FT TU (MISCELLANEOUS) ×2 IMPLANT

## 2012-05-08 NOTE — Preoperative (Signed)
Beta Blockers   Reason not to administer Beta Blockers:Not Applicable 

## 2012-05-08 NOTE — Interval H&P Note (Signed)
History and Physical Interval Note:  05/08/2012 8:48 AM  [name]  has presented today for surgery, with the diagnosis of multinodular goiter.  The various methods of treatment have been discussed with the patient and family. After consideration of risks, benefits and other options for treatment, the patient has consented to    Procedure(s) (LRB): THYROIDECTOMY (N/A) as a surgical intervention .    The patient's history has been reviewed, patient examined, no change in status, stable for surgery.  I have reviewed the patient's chart and labs.  Questions were answered to the patient's satisfaction.    Velora Heckler, MD, FACS General & Endocrine Surgery [facility] Office: [phone]  Alesia Oshields Judie Petit

## 2012-05-08 NOTE — Anesthesia Postprocedure Evaluation (Signed)
  Anesthesia Post-op Note  Patient: [name]  Procedure(s) Performed: Procedure(s) (LRB): THYROIDECTOMY (N/A)  Patient Location: PACU  Anesthesia Type: General  Level of Consciousness: awake and alert   Airway and Oxygen Therapy: Patient Spontanous Breathing  Post-op Pain: mild  Post-op Assessment: Post-op Vital signs reviewed, Patient's Cardiovascular Status Stable, Respiratory Function Stable, Patent Airway and No signs of Nausea or vomiting  Post-op Vital Signs: stable  Complications: No apparent anesthesia complications

## 2012-05-08 NOTE — Anesthesia Preprocedure Evaluation (Signed)
Anesthesia Evaluation  Patient identified by MRN, date of birth, ID band Patient awake    Reviewed: Allergy & Precautions, H&P , NPO status , Patient's Chart, lab work & pertinent test results  Airway Mallampati: II TM Distance: >3 FB Neck ROM: Full    Dental No notable dental hx.    Pulmonary neg pulmonary ROS, shortness of breath and with exertion,  breath sounds clear to auscultation  Pulmonary exam normal       Cardiovascular hypertension, Pt. on medications negative cardio ROS  Rhythm:Regular Rate:Normal     Neuro/Psych negative neurological ROS  negative psych ROS   GI/Hepatic negative GI ROS, Neg liver ROS,   Endo/Other  negative endocrine ROSHypothyroidism   Renal/GU negative Renal ROS  negative genitourinary   Musculoskeletal negative musculoskeletal ROS (+)   Abdominal   Peds negative pediatric ROS (+)  Hematology negative hematology ROS (+)   Anesthesia Other Findings   Reproductive/Obstetrics negative OB ROS                           Anesthesia Physical Anesthesia Plan  ASA: II  Anesthesia Plan: General   Post-op Pain Management:    Induction: Intravenous  Airway Management Planned: Oral ETT  Additional Equipment:   Intra-op Plan:   Post-operative Plan: Extubation in OR  Informed Consent: I have reviewed the patients History and Physical, chart, labs and discussed the procedure including the risks, benefits and alternatives for the proposed anesthesia with the patient or authorized representative who has indicated his/her understanding and acceptance.   Dental advisory given  Plan Discussed with: CRNA  Anesthesia Plan Comments:         Anesthesia Quick Evaluation

## 2012-05-08 NOTE — H&P (View-Only) (Signed)
General Surgery [facility]  Chief Complaint  Patient presents with  . New Evaluation    eval thyroid goiter, enlarging thyroid nodule - referral from Dr. Laurann Montana    HISTORY: The patient is a 56 year old white female previously followed in my practice for multinodular thyroid goiter. She was last seen in 2009. She has been followed by her primary care physician and her gynecologist. She has had sequential ultrasound scanning. Her most recent study from July 2013 shows interval enlargement of the dominant nodule in the lower pole of the left thyroid lobe. This now measures 5.5 cm compared to a measurement of 3.3 cm when she was evaluated in 2009. Also there is a 7 mm nodule in the right thyroid lobe with microcalcifications. Patient has had previous fine needle aspiration biopsy of a dominant left lobe nodule and 2008. This showed a follicular lesion without atypia.  Patient has had thyroid function test performed by her primary care physician. By report those levels are normal. Patient has never been on thyroid medication.  Patient denies any significant compressive symptoms.  Patient does have a family history of thyroid disease in her mother and her sister. There is no family history of thyroid cancer.   Past Medical History  Diagnosis Date  . Diverticulosis   . Anemia   . Multiple thyroid nodules   . Hypothyroidism   . Migraines   . Allergic rhinitis   . Acne rosacea   . Osteoarthritis   . Overactive bladder   . Obesity   . Hypertension   . Goiter   . Hip bursitis   . Tubular adenoma of colon 04/2011     Current Outpatient Prescriptions  Medication Sig Dispense Refill  . AMBULATORY NON FORMULARY MEDICATION Medication Name: CoHosh 541 mg. Take 1 capsule once daily       . Calcium Carbonate (CALCIUM 500 PO) Take 1 tablet by mouth daily.        . fexofenadine (ALLEGRA) 180 MG tablet Take 180 mg by mouth daily.        . Flaxseed, Linseed, (FLAX SEED  OIL) 1000 MG CAPS Take 1 capsule by mouth daily.        . fluticasone (FLONASE) 50 MCG/ACT nasal spray Place 2 sprays into the nose daily.        . lansoprazole (PREVACID) 30 MG capsule Take 30 mg by mouth daily.        Marland Kitchen losartan (COZAAR) 50 MG tablet Take 50 mg by mouth daily.       . metroNIDAZOLE (METROCREAM) 0.75 % cream Apply 1 application topically daily.        . Multiple Vitamin (MULTIVITAMIN) capsule Take 1 capsule by mouth daily.        . Probiotic Product (ALIGN) 4 MG CAPS Take 4 capsules by mouth daily.  14 capsule  0  . tolterodine (DETROL LA) 4 MG 24 hr capsule Take 4 mg by mouth daily.           Allergies  Allergen Reactions  . Cephalexin      Family History  Problem Relation Age of Onset  . Colon cancer Maternal Grandfather   . Diabetes Mother   . Heart disease Mother   . Breast cancer Mother   . Pulmonary embolism Father   . Hypertension Sister      History   Social History  . Marital Status: Married    Spouse Name: N/A    Number of Children: 3  .  Years of Education: N/A   Occupational History  . Speech pathology    Social History Main Topics  . Smoking status: Never Smoker   . Smokeless tobacco: Never Used  . Alcohol Use: No  . Drug Use: No  . Sexually Active: None   Other Topics Concern  . None   Social History Narrative  . None     REVIEW OF SYSTEMS - PERTINENT POSITIVES ONLY: Denies compressive symptoms. Denies tremors. Denies palpitations. Denies any new masses. Denies any pain.  EXAM: Filed Vitals:   04/17/12 1331  BP: 160/90  Pulse: 72  Temp: 97.6 F (36.4 C)  Resp: 18    HEENT: normocephalic; pupils equal and reactive; sclerae clear; dentition good; mucous membranes moist NECK:  Dominant nodule occupying a large portion of the left thyroid lobe and extending to the left clavicle, nontender, slight tracheal deviation to the right; asymmetric on extension; no palpable anterior or posterior cervical lymphadenopathy; no  supraclavicular masses; no tenderness CHEST: clear to auscultation bilaterally without rales, rhonchi, or wheezes CARDIAC: regular rate and rhythm without significant murmur; peripheral pulses are full EXT:  non-tender without edema; no deformity NEURO: no gross focal deficits; no sign of tremor   LABORATORY RESULTS: See [facility] (CHL-Epic) for most recent results   RADIOLOGY RESULTS: See [facility] (CHL-Epic) for most recent results   IMPRESSION: #1 multinodular thyroid goiter, nontoxic #2 enlarging dominant left thyroid nodule, 5.5 cm, without compressive symptoms #3 right thyroid nodule, 7 mm, with microcalcifications  PLAN: I had a lengthy discussion with the patient and her husband in my office today. We reviewed all of the above findings. I offered them continued close observation, fine needle aspiration biopsy, or total thyroidectomy. After careful consideration, they would like to proceed with total thyroidectomy. We have discussed the procedure at length. We have discussed cosmetic results to be anticipated. We have discussed potential complications including recurrent laryngeal nerve injury and injury to parathyroid glands. We have discussed the hospital stay and he returned to physical activity and work. They understand and wish to proceed with surgery in the near future.  The risks and benefits of the procedure have been discussed at length with the patient.  The patient understands the proposed procedure, potential alternative treatments, and the course of recovery to be expected.  All of the patient's questions have been answered at this time.  The patient wishes to proceed with surgery.  Velora Heckler, MD, FACS General & Endocrine Surgery [facility]   Visit Diagnoses: 1. Multinodular goiter (nontoxic)     Primary Care Physician: Cala Bradford, MD

## 2012-05-08 NOTE — Transfer of Care (Signed)
Immediate Anesthesia Transfer of Care Note  Patient: [name]  Procedure(s) Performed: Procedure(s) (LRB): THYROIDECTOMY (N/A)  Patient Location: PACU  Anesthesia Type: General  Level of Consciousness: sedated, patient cooperative and responds to stimulaton  Airway & Oxygen Therapy: Patient Spontanous Breathing and Patient connected to face mask oxgen  Post-op Assessment: Report given to PACU RN and Post -op Vital signs reviewed and stable  Post vital signs: Reviewed and stable  Complications: No apparent anesthesia complications

## 2012-05-08 NOTE — Brief Op Note (Signed)
05/08/2012  11:05 AM  PATIENT:  [name]  56 y.o. female  PRE-OPERATIVE DIAGNOSIS:  multinodular goiter  POST-OPERATIVE DIAGNOSIS:  multinodular goiter  PROCEDURE:  Procedure(s) (LRB): THYROIDECTOMY (N/A)  SURGEON:  Surgeon(s) and Role:    * Velora Heckler, MD - Primary    * Adolph Pollack, MD - Assisting  ANESTHESIA:   general  EBL:  Total I/O In: 1000 [I.V.:1000] Out: 25 [Blood:25]  BLOOD ADMINISTERED:none  DRAINS: none   LOCAL MEDICATIONS USED:  NONE  SPECIMEN:  Excision  DISPOSITION OF SPECIMEN:  PATHOLOGY  COUNTS:  YES  TOURNIQUET:  * No tourniquets in log *  DICTATION: .Other Dictation: Dictation Number [id number]  PLAN OF CARE: Admit for overnight observation  PATIENT DISPOSITION:  PACU - hemodynamically stable.   Delay start of Pharmacological VTE agent (>24hrs) due to surgical blood loss or risk of bleeding: yes  Velora Heckler, MD, FACS General & Endocrine Surgery [facility] Office: [phone]

## 2012-05-08 NOTE — Anesthesia Procedure Notes (Signed)
Procedure Name: Intubation Date/Time: 05/08/2012 9:25 AM Performed by: Leroy Libman L Patient Re-evaluated:Patient Re-evaluated prior to inductionOxygen Delivery Method: Circle system utilized Preoxygenation: Pre-oxygenation with 100% oxygen Intubation Type: IV induction Ventilation: Mask ventilation without difficulty and Oral airway inserted - appropriate to patient size Laryngoscope Size: Hyacinth Meeker and 2 Grade View: Grade I Tube type: Reinforced Tube size: 7.5 mm Number of attempts: 1 Airway Equipment and Method: Stylet Placement Confirmation: ETT inserted through vocal cords under direct vision,  breath sounds checked- equal and bilateral and positive ETCO2 Secured at: 20 cm Tube secured with: Tape Dental Injury: Teeth and Oropharynx as per pre-operative assessment

## 2012-05-09 ENCOUNTER — Telehealth (INDEPENDENT_AMBULATORY_CARE_PROVIDER_SITE_OTHER): Payer: Self-pay

## 2012-05-09 ENCOUNTER — Encounter (HOSPITAL_COMMUNITY): Payer: Self-pay | Admitting: Surgery

## 2012-05-09 ENCOUNTER — Other Ambulatory Visit (INDEPENDENT_AMBULATORY_CARE_PROVIDER_SITE_OTHER): Payer: Self-pay

## 2012-05-09 LAB — BASIC METABOLIC PANEL
BUN: 13 mg/dL (ref 6–23)
CO2: 28 mEq/L (ref 19–32)
Chloride: 103 mEq/L (ref 96–112)
GFR calc Af Amer: >90 mL/min (ref >90)
Potassium: 4.6 mEq/L (ref 3.5–5.1)

## 2012-05-09 MED ORDER — SYNTHROID 88 MCG PO TABS: 88.0000 ug | ORAL_TABLET | Freq: Every day | ORAL | Status: DC

## 2012-05-09 MED ORDER — HYDROCODONE-ACETAMINOPHEN 5-325 MG PO TABS: 1.0000 | ORAL_TABLET | ORAL | Status: AC | PRN

## 2012-05-09 MED ORDER — CALCIUM CARBONATE 1250 (500 CA) MG PO TABS: 2.0000 | ORAL_TABLET | Freq: Two times a day (BID) | ORAL | Status: DC

## 2012-05-09 NOTE — Discharge Summary (Signed)
Physician Discharge Summary [facility]  Patient ID: [name] MRN: [id number] DOB/AGE: 1955/12/09 56 y.o.  Admit date: 05/08/2012 Discharge date: 05/09/2012  Admission Diagnoses:  Multinodular thyroid goiter   Discharge Diagnoses:  Principal Problem:  *Multinodular goiter (nontoxic)   Discharged Condition: good  Hospital Course: patient admitted after total thyroidectomy to monitor for bleeding, edema, and hypocalcemia.  Calcium level 9.5 on POD#1.  Tolerating regular diet.  Pain well controlled.  Prepared for discharge home on POD#1.  Consults: None  Significant Diagnostic Studies: labs: BMET  Treatments: surgery: total thyroidectomy  Discharge Exam: Blood pressure 146/78, pulse 85, temperature 97.1 F (36.2 C), temperature source Oral, resp. rate 16, height 5\' 7"  (1.702 m), weight 199 lb (90.266 kg), SpO2 97.00%. HEENT - clear Neck - incision with mild edema; otherwise clear and dry; voice normal Chest - clear Cor - RRR  Disposition: Home with family  Discharge Orders    Future Appointments: Provider: Department: Dept Phone: Center:   05/26/2012 9:45 AM Velora Heckler, MD [facility] [phone] None     Future Orders Please Complete By Expires   Diet - low sodium heart healthy      Increase activity slowly      Discharge instructions      Comments:   [facility], [place] [phone]  THYROID & PARATHYROID SURGERY -- POST OP INSTRUCTIONS  Always review your discharge instruction sheet from the facility where your surgery was performed.  A prescription for pain medication may be given to you upon discharge.  Take your pain medication as prescribed.  If narcotic pain medicine is not needed, then you may take acetaminophen (Tylenol) or ibuprofen (Advil) as needed. Take your usually prescribed medications unless otherwise directed. If you need a refill on your pain medication, please contact your pharmacy. They will contact our  office to request authorization.  Prescriptions will not be processed after 5 pm or on weekends. Start with a light diet upon arrival home, such as soup and crackers or toast.  Be sure to drink plenty of fluids daily.  Resume your normal diet the day after surgery. Most patients will experience some swelling and bruising on the chest and neck area.  Ice packs will help.  Swelling and bruising can take several days to resolve.  It is common to experience some constipation if taking pain medication after surgery.  Increasing fluid intake and taking a stool softener will usually help or prevent this problem.  A mild laxative (Milk of Magnesia or Miralax) should be taken according to package directions if there are no bowel movements after 48 hours. You may remove your bandages 24-48 hours after surgery, and you may shower at that time.  You have steri-strips (small skin tapes) in place directly over the incision.  These strips should be left on the skin for 7-10 days and then removed. You may resume regular (light) daily activities beginning the next day-such as daily self-care, walking, climbing stairs-gradually increasing activities as tolerated.  You may have sexual intercourse when it is comfortable.  Refrain from any heavy lifting or straining until approved by your doctor.  You may drive when you no longer are taking prescription pain medication, you can comfortably wear a seatbelt, and you can safely maneuver your car and apply brakes. You should see your doctor in the office for a follow-up appointment approximately two to three weeks after your surgery.  Make sure that you call for this appointment within a day  or two after you arrive home to insure a convenient appointment time.  WHEN TO CALL YOUR DOCTOR: Fever greater than 101.5 Inability to urinate Nausea and/or vomiting - persistent Extreme swelling or bruising Continued bleeding from incision Increased pain, redness, or drainage from the  incision Difficulty swallowing or breathing Muscle cramping or spasms Numbness or tingling in hands or around lips  The clinic staff is available to answer your questions during regular business hours.  Please don't hesitate to call and ask to speak to one of the nurses if you have concerns. [facility], [place] [phone]  [email or web address]   Remove dressing in 24 hours      Apply dressing      Scheduling Instructions:   Apply light dry gauze dressing to wound before discharge.     Medication List  As of 05/09/2012 10:15 AM   TAKE these medications         ALEVE 220 MG Caps   Generic drug: Naproxen Sodium   Take 440 mg by mouth every 8 (eight) hours as needed. pain      ALIGN 4 MG Caps   Take 1 capsule by mouth every morning.      AMBULATORY NON FORMULARY MEDICATION   Medication Name: CoHosh 541 mg. Take 1 capsule once daily      Calcium 1200 1200-1000 MG-UNIT Chew   Chew 1 tablet by mouth daily.      calcium carbonate 1250 MG tablet   Commonly known as: OS-CAL - dosed in mg of elemental calcium   Take 2 tablets (1,000 mg of elemental calcium total) by mouth 2 (two) times daily.      fexofenadine 180 MG tablet   Commonly known as: ALLEGRA   Take 180 mg by mouth daily. As needed      fluticasone 50 MCG/ACT nasal spray   Commonly known as: FLONASE   Place 2 sprays into the nose daily.      HYDROcodone-acetaminophen 5-325 MG per tablet   Commonly known as: NORCO/VICODIN   Take 1-2 tablets by mouth every 4 (four) hours as needed.      lansoprazole 15 MG capsule   Commonly known as: PREVACID   Take 15 mg by mouth daily.      losartan 50 MG tablet   Commonly known as: COZAAR   Take 50 mg by mouth every morning.      metroNIDAZOLE 0.75 % cream   Commonly known as: METROCREAM   Apply 1 application topically daily.      SYNTHROID 88 MCG tablet   Generic drug: levothyroxine   Take 1 tablet (88 mcg total) by mouth daily.      tolterodine 4  MG 24 hr capsule   Commonly known as: DETROL LA   Take 4 mg by mouth every morning.      Vitamin D 400 UNITS capsule   Take 400 Units by mouth daily.           Velora Heckler, MD, FACS General & Endocrine Surgery [facility] Office: [phone]   Signed: Velora Heckler 05/09/2012, 10:15 AM

## 2012-05-09 NOTE — Telephone Encounter (Signed)
Pt notified of appt and lab slip mailed to pt for labs on 9-13.

## 2012-05-09 NOTE — Care Management Note (Signed)
    Page 1 of 1   05/09/2012     1:21:16 PM   CARE MANAGEMENT NOTE 05/09/2012  Patient:  [name], [name]   Account Number:  [id number]  Date Initiated:  05/09/2012  Documentation initiated by:  Lorenda Ishihara  Subjective/Objective Assessment:   56 yo female admitted s/p total thyroidectomy. PTA lived at home with spouse.     Action/Plan:   Anticipated DC Date:  05/09/2012   Anticipated DC Plan:  HOME/SELF CARE      DC Planning Services  CM consult      Choice offered to / List presented to:             Status of service:  Completed, signed off Medicare Important Message given?   (If response is "NO", the following Medicare IM given date fields will be blank) Date Medicare IM given:   Date Additional Medicare IM given:    Discharge Disposition:  HOME/SELF CARE  Per UR Regulation:  Reviewed for med. necessity/level of care/duration of stay  If discussed at Long Length of Stay Meetings, dates discussed:    Comments:

## 2012-05-09 NOTE — Op Note (Signed)
[name], [name]                ACCOUNT NO.:  [id number]  MEDICAL RECORD NO.:  [id number]  LOCATION:  1534                         FACILITY:  [facility]  PHYSICIAN:  Velora Heckler, MD      DATE OF BIRTH:  1956/02/25  DATE OF PROCEDURE:  05/08/2012                               OPERATIVE REPORT   PREOPERATIVE DIAGNOSIS:  Multinodular thyroid goiter.  POSTOPERATIVE DIAGNOSIS:  Multinodular thyroid goiter.  PROCEDURE:  Total thyroidectomy.  SURGEON:  Velora Heckler, MD, FACS  ASSISTANT:  Adolph Pollack, MD, FACS  ANESTHESIA:  General per Dr. Phillips Grout.  ESTIMATED BLOOD LOSS:  Minimal.  PREPARATION:  ChloraPrep.  COMPLICATIONS:  None.  INDICATIONS:  The patient is a 56 year old, white female followed for several years with multinodular goiter.  Sequential ultrasound scanning has showed progressive enlargement of a dominant nodule in the lower pole of the left thyroid lobe.  She has also developed a right-sided thyroid nodule with microcalcifications.  The patient comes to surgery for thyroidectomy for definitive diagnosis.  BODY OF REPORT:  Procedure was done in OR #1 at the [facility].  The patient was brought to the operating room and placed in a supine position on the operating room table.  Following administration of general anesthesia, the patient was positioned and then prepped and draped in usual strict aseptic fashion.  After ascertaining that an adequate level of anesthesia had been achieved, a Kocher incision was made with a #15 blade.  Dissection was carried through subcutaneous tissues and platysma.  Hemostasis was obtained with the electrocautery.  Skin flaps were elevated cephalad and caudad from the thyroid notch to the sternal notch.  A Mahorner self-retaining retractor was placed for exposure.  Strap muscles were incised in the midline.  Dissection was begun on the left side.  Left thyroid lobe was markedly enlarged.  It was  multinodular with a dominant mass occupying the central portion and another dominant nodule in the inferior portion of the left thyroid lobe.  Lobe was gently mobilized with blunt dissection.  Venous tributaries were divided between Ligaclips with the Harmonic Scalpel.  Superior pole vessels were dissected out and divided individually between medium Ligaclips with the Harmonic Scalpel.  Superior parathyroid tissue was identified and preserved on its vascular pedicle.  Inferior venous tributaries were also divided between Ligaclips with the Harmonic Scalpel.  Gland was rolled anteriorly.  Branches of the inferior thyroid artery were divided between small Ligaclips with the Harmonic Scalpel.  Recurrent laryngeal nerve was identified and preserved.  Ligament of Allyson Sabal was released with the electrocautery, and the gland was mobilized onto the anterior trachea.  There was a small pyramidal lobe which was excised off the anterior thyroid cartilage and resected with the isthmus.  Isthmus was mobilized across the midline using the electrocautery for hemostasis. Dry pack was placed in the left neck.  Next, we turned our attention to the right side.  Again, strap muscles were reflected laterally exposing the right thyroid lobe.  Right lobe was largely normal in size.  It does contain small nodules.  It was gently mobilized.  Superior pole vessels were dissected out.  The patient  does have a high right superior pole.  Vessels were divided individually between medium Ligaclips with the Harmonic Scalpel. Superior parathyroid tissue was again identified and preserved on its vascular pedicle.  Gland was rolled anteriorly and branches of the inferior thyroid artery were divided between small Ligaclips.  Recurrent laryngeal nerve was identified and preserved.  Inferior venous tributaries were divided between Ligaclips with the Harmonic Scalpel. Gland was rolled onto the anterior trachea from which it was  completely excised with the electrocautery.  Suture was used to mark the left superior pole.  The entire thyroid gland was submitted to Pathology for review.  Palpation in the neck reveals no abnormal lymphadenopathy, no further thyroid tissue, and no other masses.  Neck was irrigated with warm saline which was evacuated.  Good hemostasis was noted.  Surgicel was placed throughout the operative field.  Strap muscles were reapproximated in the midline with interrupted 3-0 Vicryl sutures. Platysma was closed with interrupted 3-0 Vicryl sutures.  Skin was closed with a running 4-0 Monocryl subcuticular suture.  Wound was washed and dried and benzoin and Steri-Strips were applied.  Sterile dressings were applied.  The patient was awakened from anesthesia and brought to the recovery room.  The patient tolerated the procedure well.   Velora Heckler, MD, FACS General & Endocrine Surgery [facility] Office: [phone]  TMG/MEDQ  D:  05/08/2012  T:  05/09/2012  Job:  [id number]  cc:   Stacie Acres. Cliffton Asters, M.D. Fax: [phone]

## 2012-05-13 ENCOUNTER — Other Ambulatory Visit (INDEPENDENT_AMBULATORY_CARE_PROVIDER_SITE_OTHER): Payer: Self-pay

## 2012-05-13 ENCOUNTER — Encounter (INDEPENDENT_AMBULATORY_CARE_PROVIDER_SITE_OTHER): Payer: Self-pay | Admitting: Surgery

## 2012-05-13 ENCOUNTER — Telehealth (INDEPENDENT_AMBULATORY_CARE_PROVIDER_SITE_OTHER): Payer: Self-pay

## 2012-05-13 ENCOUNTER — Telehealth (INDEPENDENT_AMBULATORY_CARE_PROVIDER_SITE_OTHER): Payer: Self-pay | Admitting: Surgery

## 2012-05-13 DIAGNOSIS — C73 Malignant neoplasm of thyroid gland: Secondary | ICD-10-CM

## 2012-05-13 NOTE — Telephone Encounter (Signed)
Called path results to patient.  Will arrange consult with [facility] regarding possible RAI treatment.  Velora Heckler, MD, [facility] Office: [phone]

## 2012-05-13 NOTE — Telephone Encounter (Signed)
Msg left with Jasmine December at Dr Altheimer's office for appt. Records faxed to his office.

## 2012-05-15 ENCOUNTER — Telehealth (INDEPENDENT_AMBULATORY_CARE_PROVIDER_SITE_OTHER): Payer: Self-pay | Admitting: General Surgery

## 2012-05-15 NOTE — Telephone Encounter (Signed)
Pt calling with update:  All the tapes have come off her neck in the shower.  Incision is slightly pink and warm, but not looking infected at all.  She is using ice pack on neck for comfort.  No problems at all.

## 2012-05-20 ENCOUNTER — Telehealth (INDEPENDENT_AMBULATORY_CARE_PROVIDER_SITE_OTHER): Payer: Self-pay

## 2012-05-20 NOTE — Telephone Encounter (Signed)
Pt given appt with Dr Altheimer 9-12 @ 3:00.

## 2012-05-26 ENCOUNTER — Encounter (INDEPENDENT_AMBULATORY_CARE_PROVIDER_SITE_OTHER): Payer: Managed Care, Other (non HMO) | Admitting: Surgery

## 2012-06-05 ENCOUNTER — Other Ambulatory Visit (INDEPENDENT_AMBULATORY_CARE_PROVIDER_SITE_OTHER): Payer: Self-pay | Admitting: Surgery

## 2012-06-09 ENCOUNTER — Ambulatory Visit (INDEPENDENT_AMBULATORY_CARE_PROVIDER_SITE_OTHER): Payer: Managed Care, Other (non HMO) | Admitting: Surgery

## 2012-06-09 ENCOUNTER — Encounter (INDEPENDENT_AMBULATORY_CARE_PROVIDER_SITE_OTHER): Payer: Self-pay | Admitting: Surgery

## 2012-06-09 VITALS — BP 122/64 | HR 92 | Temp 98.4°F | Resp 20 | Ht 68.0 in | Wt 204.2 lb

## 2012-06-09 DIAGNOSIS — C73 Malignant neoplasm of thyroid gland: Secondary | ICD-10-CM

## 2012-06-09 NOTE — Progress Notes (Signed)
General Surgery [facility]  Visit Diagnoses: 1. Thyroid cancer, follicular variant of papillary thyroid carcinoma     HISTORY: Patient returns for her first postoperative visit having undergone total thyroidectomy on 05/08/2012. Final pathology shows a 4 mm follicular variant of papillary thyroid carcinoma with negative surgical margins. Followup serum calcium level is normal at 9.9. Patient is currently taking Synthroid 88 mcg daily.  EXAM: Surgical wound is healing nicely. Minimal soft tissue swelling. Voice quality is normal. No sign of infection.  IMPRESSION: Follicular variant of papillary thyroid carcinoma, 4 mm, negative margins  PLAN: Patient is scheduled to see Dr. Casimiro Needle Altheimer in consultation in the near future. He will make the final determination as to whether or not the patient will require radioactive iodine treatment. I have asked her to remain on her current dose of Synthroid and feel that evaluation.  Patient will begin applying topical creams to her incision. She may discontinue supplemental calcium other than a once daily dose with vitamin D for bone health.  Patient will return to see me in 6 weeks.  Velora Heckler, MD, FACS General & Endocrine Surgery [facility]

## 2012-06-09 NOTE — Patient Instructions (Signed)
  COCOA BUTTER & VITAMIN E CREAM  ([employer] or other brand)  Apply cocoa butter/vitamin E cream to your incision 2 - 3 times daily.  Massage cream into incision for one minute with each application.  Use sunscreen (50 SPF or higher) for first 6 months after surgery if area is exposed to sun.  You may substitute Mederma or other scar reducing creams as desired.   

## 2012-07-31 ENCOUNTER — Encounter (INDEPENDENT_AMBULATORY_CARE_PROVIDER_SITE_OTHER): Payer: Managed Care, Other (non HMO) | Admitting: Surgery

## 2012-09-10 ENCOUNTER — Encounter (HOSPITAL_BASED_OUTPATIENT_CLINIC_OR_DEPARTMENT_OTHER): Payer: Self-pay | Admitting: Emergency Medicine

## 2012-09-10 ENCOUNTER — Inpatient Hospital Stay (HOSPITAL_BASED_OUTPATIENT_CLINIC_OR_DEPARTMENT_OTHER)
Admission: EM | Admit: 2012-09-10 | Discharge: 2012-09-15 | DRG: 813 | Disposition: A | Discharge status: Home / Self Care | Payer: BC Managed Care – PPO | Attending: Internal Medicine | Admitting: Internal Medicine

## 2012-09-10 ENCOUNTER — Emergency Department (HOSPITAL_BASED_OUTPATIENT_CLINIC_OR_DEPARTMENT_OTHER): Payer: BC Managed Care – PPO

## 2012-09-10 DIAGNOSIS — C73 Malignant neoplasm of thyroid gland: Secondary | ICD-10-CM

## 2012-09-10 DIAGNOSIS — L719 Rosacea, unspecified: Secondary | ICD-10-CM

## 2012-09-10 DIAGNOSIS — Z79899 Other long term (current) drug therapy: Secondary | ICD-10-CM

## 2012-09-10 DIAGNOSIS — Z9079 Acquired absence of other genital organ(s): Secondary | ICD-10-CM

## 2012-09-10 DIAGNOSIS — G43909 Migraine, unspecified, not intractable, without status migrainosus: Secondary | ICD-10-CM | POA: Diagnosis present

## 2012-09-10 DIAGNOSIS — E669 Obesity, unspecified: Secondary | ICD-10-CM | POA: Diagnosis present

## 2012-09-10 DIAGNOSIS — Z90710 Acquired absence of both cervix and uterus: Secondary | ICD-10-CM

## 2012-09-10 DIAGNOSIS — E876 Hypokalemia: Secondary | ICD-10-CM | POA: Diagnosis present

## 2012-09-10 DIAGNOSIS — K5792 Diverticulitis of intestine, part unspecified, without perforation or abscess without bleeding: Secondary | ICD-10-CM

## 2012-09-10 DIAGNOSIS — Z85038 Personal history of other malignant neoplasm of large intestine: Secondary | ICD-10-CM

## 2012-09-10 DIAGNOSIS — K219 Gastro-esophageal reflux disease without esophagitis: Secondary | ICD-10-CM

## 2012-09-10 DIAGNOSIS — E0789 Other specified disorders of thyroid: Secondary | ICD-10-CM | POA: Diagnosis present

## 2012-09-10 DIAGNOSIS — E042 Nontoxic multinodular goiter: Secondary | ICD-10-CM

## 2012-09-10 DIAGNOSIS — D72829 Elevated white blood cell count, unspecified: Secondary | ICD-10-CM | POA: Diagnosis present

## 2012-09-10 DIAGNOSIS — I10 Essential (primary) hypertension: Secondary | ICD-10-CM

## 2012-09-10 DIAGNOSIS — E039 Hypothyroidism, unspecified: Secondary | ICD-10-CM

## 2012-09-10 DIAGNOSIS — K5289 Other specified noninfective gastroenteritis and colitis: Secondary | ICD-10-CM

## 2012-09-10 DIAGNOSIS — M1990 Unspecified osteoarthritis, unspecified site: Secondary | ICD-10-CM | POA: Diagnosis present

## 2012-09-10 DIAGNOSIS — K529 Noninfective gastroenteritis and colitis, unspecified: Principal | ICD-10-CM

## 2012-09-10 HISTORY — DX: Diverticulitis of intestine, part unspecified, without perforation or abscess without bleeding: K57.92

## 2012-09-10 LAB — COMPREHENSIVE METABOLIC PANEL
Albumin: 4 g/dL (ref 3.5–5.2)
Alkaline Phosphatase: 130 U/L — ABNORMAL HIGH (ref 39–117)
BUN: 31 mg/dL — ABNORMAL HIGH (ref 6–23)
Calcium: 9.4 mg/dL (ref 8.4–10.5)
GFR calc Af Amer: >90 mL/min (ref >90)
Glucose, Bld: 151 mg/dL — ABNORMAL HIGH (ref 70–99)
Potassium: 3.9 mEq/L (ref 3.5–5.1)
Sodium: 139 mEq/L (ref 135–145)
Total Protein: 7.3 g/dL (ref 6.0–8.3)

## 2012-09-10 LAB — URINE MICROSCOPIC-ADD ON

## 2012-09-10 LAB — CBC WITH DIFFERENTIAL/PLATELET
Basophils Relative: 0 % (ref 0–1)
Eosinophils Absolute: 0 10*3/uL (ref 0.0–0.7)
Eosinophils Relative: 0 % (ref 0–5)
Lymphs Abs: 0.9 10*3/uL (ref 0.7–4.0)
MCH: 31.2 pg (ref 26.0–34.0)
MCHC: 34.2 g/dL (ref 30.0–36.0)
MCV: 91 fL (ref 78.0–100.0)
Monocytes Relative: 4 % (ref 3–12)
Neutrophils Relative %: 91 % — ABNORMAL HIGH (ref 43–77)
Platelets: 240 10*3/uL (ref 150–400)

## 2012-09-10 LAB — URINALYSIS, ROUTINE W REFLEX MICROSCOPIC
Leukocytes, UA: NEGATIVE
Nitrite: NEGATIVE
Specific Gravity, Urine: 1.034 — ABNORMAL HIGH (ref 1.005–1.030)
Urobilinogen, UA: 0.2 mg/dL (ref 0.0–1.0)
pH: 6 (ref 5.0–8.0)

## 2012-09-10 LAB — LIPASE, BLOOD: Lipase: 23 U/L (ref 11–59)

## 2012-09-10 MED ORDER — HYDROMORPHONE HCL PF 1 MG/ML IJ SOLN
1.0000 mg | Freq: Once | INTRAMUSCULAR | Status: AC
  Administered 2012-09-10: 1 mg via INTRAVENOUS

## 2012-09-10 MED ORDER — DEXTROSE-NACL 5-0.9 % IV SOLN
INTRAVENOUS | Status: DC
  Administered 2012-09-10 – 2012-09-14 (×4): via INTRAVENOUS
  Filled 2012-09-10: qty 1000

## 2012-09-10 MED ORDER — HYDROMORPHONE HCL PF 1 MG/ML IJ SOLN
INTRAMUSCULAR | Status: AC
  Administered 2012-09-10: 1 mg via INTRAVENOUS
  Filled 2012-09-10: qty 1

## 2012-09-10 MED ORDER — METRONIDAZOLE IN NACL 5-0.79 MG/ML-% IV SOLN
500.0000 mg | Freq: Three times a day (TID) | INTRAVENOUS | Status: DC
  Administered 2012-09-10 – 2012-09-12 (×6): 500 mg via INTRAVENOUS
  Filled 2012-09-10 (×8): qty 100

## 2012-09-10 MED ORDER — HYDROMORPHONE HCL PF 1 MG/ML IJ SOLN
INTRAMUSCULAR | Status: AC
  Filled 2012-09-10: qty 1

## 2012-09-10 MED ORDER — ONDANSETRON HCL 4 MG/2ML IJ SOLN
4.0000 mg | Freq: Four times a day (QID) | INTRAMUSCULAR | Status: DC | PRN
  Administered 2012-09-10 – 2012-09-14 (×10): 4 mg via INTRAVENOUS
  Filled 2012-09-10 (×10): qty 2

## 2012-09-10 MED ORDER — IOHEXOL 300 MG/ML  SOLN: 50.0000 mL | Freq: Once | INTRAMUSCULAR | Status: AC | PRN

## 2012-09-10 MED ORDER — HYDROMORPHONE HCL PF 1 MG/ML IJ SOLN
1.0000 mg | INTRAMUSCULAR | Status: AC | PRN
  Administered 2012-09-11 (×3): 1 mg via INTRAVENOUS
  Filled 2012-09-10 (×5): qty 1

## 2012-09-10 MED ORDER — HYDROMORPHONE HCL PF 1 MG/ML IJ SOLN
2.0000 mg | Freq: Once | INTRAMUSCULAR | Status: AC
  Administered 2012-09-10: 2 mg via INTRAVENOUS
  Filled 2012-09-10: qty 2

## 2012-09-10 MED ORDER — IOHEXOL 300 MG/ML  SOLN
100.0000 mL | Freq: Once | INTRAMUSCULAR | Status: AC | PRN
  Administered 2012-09-10: 100 mL via INTRAVENOUS

## 2012-09-10 MED ORDER — CIPROFLOXACIN IN D5W 400 MG/200ML IV SOLN
400.0000 mg | Freq: Two times a day (BID) | INTRAVENOUS | Status: DC
  Administered 2012-09-10 – 2012-09-12 (×4): 400 mg via INTRAVENOUS
  Filled 2012-09-10 (×7): qty 200

## 2012-09-10 MED ORDER — HYDROMORPHONE HCL PF 1 MG/ML IJ SOLN
1.0000 mg | Freq: Once | INTRAMUSCULAR | Status: AC
  Administered 2012-09-10: 1 mg via INTRAVENOUS
  Filled 2012-09-10: qty 1

## 2012-09-10 MED ORDER — METRONIDAZOLE 0.75 % EX CREA
1.0000 "application " | TOPICAL_CREAM | Freq: Every day | CUTANEOUS | Status: DC
  Filled 2012-09-10 (×4): qty 1

## 2012-09-10 MED ORDER — CIPROFLOXACIN IN D5W 400 MG/200ML IV SOLN
400.0000 mg | Freq: Once | INTRAVENOUS | Status: AC
  Administered 2012-09-10: 400 mg via INTRAVENOUS
  Filled 2012-09-10: qty 200

## 2012-09-10 MED ORDER — METRONIDAZOLE 0.75 % EX GEL
Freq: Every day | CUTANEOUS | Status: DC
  Administered 2012-09-12 – 2012-09-15 (×3): via TOPICAL
  Filled 2012-09-10: qty 45

## 2012-09-10 MED ORDER — ONDANSETRON HCL 4 MG/2ML IJ SOLN
4.0000 mg | Freq: Once | INTRAMUSCULAR | Status: AC
  Administered 2012-09-10: 4 mg via INTRAVENOUS
  Filled 2012-09-10: qty 2

## 2012-09-10 MED ORDER — METRONIDAZOLE IN NACL 5-0.79 MG/ML-% IV SOLN
500.0000 mg | Freq: Once | INTRAVENOUS | Status: AC
  Administered 2012-09-10: 500 mg via INTRAVENOUS
  Filled 2012-09-10: qty 100

## 2012-09-10 MED ORDER — DIPHENHYDRAMINE HCL 50 MG/ML IJ SOLN
12.5000 mg | Freq: Four times a day (QID) | INTRAMUSCULAR | Status: DC | PRN
  Administered 2012-09-10 – 2012-09-13 (×5): 12.5 mg via INTRAVENOUS
  Filled 2012-09-10 (×5): qty 1

## 2012-09-10 MED ORDER — SODIUM CHLORIDE 0.9 % IV BOLUS (SEPSIS)
1000.0000 mL | Freq: Once | INTRAVENOUS | Status: AC
  Administered 2012-09-10: 1000 mL via INTRAVENOUS

## 2012-09-10 MED ORDER — ONDANSETRON HCL 4 MG PO TABS
4.0000 mg | ORAL_TABLET | Freq: Four times a day (QID) | ORAL | Status: DC | PRN
  Administered 2012-09-14 – 2012-09-15 (×2): 4 mg via ORAL
  Filled 2012-09-10 (×2): qty 1

## 2012-09-10 MED ORDER — SODIUM CHLORIDE 0.9 % IV SOLN
Freq: Once | INTRAVENOUS | Status: AC
  Administered 2012-09-10: 1000 mL via INTRAVENOUS

## 2012-09-10 MED ORDER — SODIUM CHLORIDE 0.9 % IV SOLN: INTRAVENOUS | Status: AC

## 2012-09-10 MED ORDER — HYDROMORPHONE HCL PF 1 MG/ML IJ SOLN
1.0000 mg | INTRAMUSCULAR | Status: DC | PRN
  Administered 2012-09-10 (×3): 1 mg via INTRAVENOUS
  Filled 2012-09-10 (×2): qty 1

## 2012-09-10 NOTE — ED Notes (Signed)
Patient transported to CT 

## 2012-09-10 NOTE — H&P (Signed)
[facility] History and Physical  [name] ZOX:[id number] DOB: [birth year] DOA: 09/10/2012  Referring physician: Margarita Grizzle, ER physician PCP: Cala Bradford, MD  Specialists: None  Chief Complaint: Nausea and vomiting plus diarrhea  HPI: [name] is a 57 y.o. female  Past medical history of hypothyroidism and hypertension who was in her usual state of health when suddenly she started having severe abdominal cramping mostly in the upper quadrant of her abdomen but also some radiation across her back. She thought she had to go to the bathroom but could not. She takes improved use and then started having significant diarrhea some of which had blood. She also had some forceful vomiting. When her symptoms did not improve, she came to the emergency [facility] [place] [facility]. There she was evaluated and found to have a white blood cell count of 19 with a 91% shift as well as CT scan notable for transverse colitis. Patient was given a dose of IV Flagyl and Cipro as well as medicine for pain and nausea. Hospitalists were called and patient was accepted and transferred to [facility] For further workup  Review of Systems: When I saw the patient after she arrived on the floor, she was doing okay. She is having some mild abdominal pain most in the upper quadrants as her pain medication wore off. She denied any nausea. She was complaining of some itching which happens after pain medication. She denied any headaches, vision changes, dysphasia, chest pain, palpitations, shortness of breath, wheeze, cough, hematuria, dysuria, constipation, focal extremity numbness or weakness or pain. She feels quite fatigued. She feels mildly nauseated. Review systems is otherwise negative.   Past Medical History  Diagnosis Date  . Diverticulosis   . Anemia   . Multiple thyroid nodules   . Hypothyroidism   . Migraines   . Allergic rhinitis   . Acne rosacea   . Osteoarthritis   . Obesity   .  Hypertension   . Goiter   . Hip bursitis   . Tubular adenoma of colon 04/2011  . Shortness of breath     with exertion   . GERD (gastroesophageal reflux disease)   . Overactive bladder     carpal tunnel    Past Surgical History  Procedure Date  . Total abdominal hysterectomy w/ bilateral salpingoophorectomy 2005  . Nasal septum surgery   . Cautery of turbinates 1990's  . Partial knee replacement 04/2009    Left  . Knee arthroscopy 11/2010    Right  . Tubal ligation   . Thyroidectomy 05/08/2012    Procedure: THYROIDECTOMY;  Surgeon: Velora Heckler, MD;  Location: [facility];  Service: General;  Laterality: N/A;  Total Thyriodectomy   Social History:  reports that she has never smoked. She has never used smokeless tobacco. She reports that she does not drink alcohol or use illicit drugs. Patient lives at home with her husband. She normally is able to participate in activities of daily living without assistance  Allergies  Allergen Reactions  . Cephalexin Hives  . Zithromax (Azithromycin) Other (See Comments)    Gi upset     Family History  Problem Relation Age of Onset  . Colon cancer Maternal Grandfather   . Diabetes Mother   . Heart disease Mother   . Breast cancer Mother   . Pulmonary embolism Father   . Hypertension Sister     Prior to Admission medications   Medication Sig Start Date End Date Taking? Authorizing Provider  AMBULATORY NON FORMULARY MEDICATION Medication Name: CoHosh 541 mg. Take 1 capsule once daily   Yes Historical Provider, MD  calcium carbonate (OS-CAL - DOSED IN MG OF ELEMENTAL CALCIUM) 1250 MG tablet Take 2 tablets (1,000 mg of elemental calcium total) by mouth 2 (two) times daily. 05/09/12 05/09/13 Yes Velora Heckler, MD  Cholecalciferol (VITAMIN D) 400 UNITS capsule Take 400 Units by mouth daily.   Yes Historical Provider, MD  fluticasone (FLONASE) 50 MCG/ACT nasal spray Place 2 sprays into the nose daily.    Yes Historical Provider, MD  lansoprazole  (PREVACID) 15 MG capsule Take 15 mg by mouth daily.   Yes Historical Provider, MD  levothyroxine (SYNTHROID, LEVOTHROID) 137 MCG tablet Take 137 mcg by mouth daily.   Yes Historical Provider, MD  losartan (COZAAR) 50 MG tablet Take 50 mg by mouth every morning.    Yes Historical Provider, MD  metroNIDAZOLE (METROCREAM) 0.75 % cream Apply 1 application topically daily.    Yes Historical Provider, MD  Naproxen Sodium (ALEVE) 220 MG CAPS Take 440 mg by mouth every 8 (eight) hours as needed. pain   Yes Historical Provider, MD  Probiotic Product (ALIGN) 4 MG CAPS Take 1 capsule by mouth every morning. 04/26/11  Yes Meryl Dare, MD,FACG  tolterodine (DETROL LA) 4 MG 24 hr capsule Take 4 mg by mouth every morning.    Yes Historical Provider, MD  zolpidem (AMBIEN) 10 MG tablet Take 5 mg by mouth at bedtime as needed. For sleep.   Yes Historical Provider, MD   Physical Exam: Filed Vitals:   09/10/12 1000 09/10/12 1100 09/10/12 1145 09/10/12 1430  BP: 141/56 126/52 136/65 120/54  Pulse: 104 117 105 105  Temp:   98.2 F (36.8 C) 98.3 F (36.8 C)  TempSrc:      Resp:   20 18  SpO2: 94% 91% 93% 97%     General:  Alert and oriented x3, mild distress, fatigue, looks older than stated age  Eyes: Extraocular movements are intact, sclera nonicteric  ENT: Normocephalic and atraumatic, mucous membranes are dry  Neck: No JVD, thyromegaly  Cardiovascular: Regular rate and rhythm  Respiratory: Clear auscultation bilaterally  Abdomen: Soft, nontender, nondistended, positive bowel sounds  Skin: No skin breaks tears or lesions  Musculoskeletal: No clubbing or cyanosis or edema  Psychiatric: Patient is appropriate, no evidence of psychoses  Neurologic: No focal deficits  Labs on Admission:  Basic Metabolic Panel:  Lab 09/10/12 1610  NA 139  K 3.9  CL 104  CO2 24  GLUCOSE 151*  BUN 31*  CREATININE 0.70  CALCIUM 9.4  MG --  PHOS --   Liver Function Tests:  Lab 09/10/12 0755  AST  49*  ALT 49*  ALKPHOS 130*  BILITOT 0.4  PROT 7.3  ALBUMIN 4.0    Lab 09/10/12 0755  LIPASE 23  AMYLASE --   CBC:  Lab 09/10/12 0755  WBC 19.2*  NEUTROABS 17.4*  HGB 13.9  HCT 40.6  MCV 91.0  PLT 240    Radiological Exams on Admission: Ct Abdomen Pelvis W Contrast  09/10/2012   IMPRESSION:  1.  Marked abnormality of the transverse and descending colon, consistent with colitis.  Infectious/inflammatory, or ischemic causes should be considered.  Diverticulitis is felt to be less likely. 2.  No evidence for free intraperitoneal air or abscess. 3.  Diverticulosis.   Original Report Authenticated By: Norva Pavlov, M.D.       Assessment/Plan Principal Problem:  *Colitis: Given 18  daily plus symptoms plus elevated white blood cell count, strongly suspicious more for infectious process. N.p.o. plus medicines for pain and nausea plus IV antibiotics. Repeat lab work in the morning. As patient's symptoms improved, advance diet. He does not respond, consider ischemic process as she does have some risk factors Active Problems:  GERD: Stable  Thyroid cancer, follicular variant of papillary thyroid carcinoma: Holding all Synthroid  Hypertension: Holding her antihypertensive medications given dehydration  Rosacea: When necessary hydrocortisone cream    Code Status: Full code Family Communication: Daughter present at the bedside Disposition Plan: Likely here for the next one to 2 days  Time spent: 35 minutes  Hollice Espy [facility] Pager [phone]  If 7PM-7AM, please contact night-coverage [email or web address] Password [email or web address] 09/10/2012, 5:01 PM

## 2012-09-10 NOTE — ED Notes (Signed)
The patient is undressed and in a gown. Family is at the bedside, and a warm blanket has been given.

## 2012-09-10 NOTE — ED Provider Notes (Signed)
History     CSN: [id number]  Arrival date & time 09/10/12  4696   First MD Initiated Contact with Patient 09/10/12 [phone]      Chief Complaint  Patient presents with  . Abdominal Pain  . Emesis  . Diarrhea    (Consider location/radiation/quality/duration/timing/severity/associated sxs/prior treatment) HPI  Patient began having crampy abdominal pain at 11p last night diffuse in nature.  Patient felt constipated and drank coffee and prune juice, then had explosive diarrhea  With brbpr multiple times. Patient with chills, no definite fever.  She vomited one time which was food content.  No uti symptoms, rhinnorhea, uri, or cough.  Abdominal pain continues diffusely crampy.     Past Medical History  Diagnosis Date  . Diverticulosis   . Anemia   . Multiple thyroid nodules   . Hypothyroidism   . Migraines   . Allergic rhinitis   . Acne rosacea   . Osteoarthritis   . Obesity   . Hypertension   . Goiter   . Hip bursitis   . Tubular adenoma of colon 04/2011  . Shortness of breath     with exertion   . GERD (gastroesophageal reflux disease)   . Overactive bladder     carpal tunnel     Past Surgical History  Procedure Date  . Total abdominal hysterectomy w/ bilateral salpingoophorectomy 2005  . Nasal septum surgery   . Cautery of turbinates 1990's  . Partial knee replacement 04/2009    Left  . Knee arthroscopy 11/2010    Right  . Tubal ligation   . Thyroidectomy 05/08/2012    Procedure: THYROIDECTOMY;  Surgeon: Velora Heckler, MD;  Location: [facility];  Service: General;  Laterality: N/A;  Total Thyriodectomy    Family History  Problem Relation Age of Onset  . Colon cancer Maternal Grandfather   . Diabetes Mother   . Heart disease Mother   . Breast cancer Mother   . Pulmonary embolism Father   . Hypertension Sister     History  Substance Use Topics  . Smoking status: Never Smoker   . Smokeless tobacco: Never Used  . Alcohol Use: No    OB History    Grav Para Term  Preterm Abortions TAB SAB Ect Mult Living                  Review of Systems  Constitutional: Positive for fever, chills and appetite change.  HENT: Negative.   Eyes: Negative.   Respiratory: Negative.   Cardiovascular: Negative.   Gastrointestinal: Positive for nausea, vomiting, abdominal pain, diarrhea, constipation and anal bleeding.  Genitourinary: Negative.   Musculoskeletal: Negative.   Skin: Negative.   Hematological: Negative.   Psychiatric/Behavioral: Negative.     Allergies  Cephalexin and Zithromax  Home Medications   Current Outpatient Rx  Name  Route  Sig  Dispense  Refill  . LEVOTHYROXINE SODIUM 137 MCG PO TABS   Oral   Take 137 mcg by mouth daily.         . AMBULATORY NON FORMULARY MEDICATION      Medication Name: CoHosh 541 mg. Take 1 capsule once daily         . CALCIUM CARBONATE 1250 MG PO TABS   Oral   Take 2 tablets (1,000 mg of elemental calcium total) by mouth 2 (two) times daily.   60 tablet   1   . VITAMIN D 400 UNITS PO CAPS   Oral   Take 400 Units by  mouth daily.         Marland Kitchen FLUTICASONE PROPIONATE 50 MCG/ACT NA SUSP   Nasal   Place 2 sprays into the nose daily.          Marland Kitchen LANSOPRAZOLE 15 MG PO CPDR   Oral   Take 15 mg by mouth daily.         Marland Kitchen LOSARTAN POTASSIUM 50 MG PO TABS   Oral   Take 50 mg by mouth every morning.          Marland Kitchen METRONIDAZOLE 0.75 % EX CREA   Topical   Apply 1 application topically daily.          Marland Kitchen NAPROXEN SODIUM 220 MG PO CAPS   Oral   Take 440 mg by mouth every 8 (eight) hours as needed. pain         . ALIGN 4 MG PO CAPS   Oral   Take 1 capsule by mouth every morning.         . TOLTERODINE TARTRATE ER 4 MG PO CP24   Oral   Take 4 mg by mouth every morning.            BP 145/76  Pulse 103  Temp 98 F (36.7 C) (Oral)  Resp 18  SpO2 96%  Physical Exam  Nursing note and vitals reviewed. Constitutional: She is oriented to person, place, and time. She appears well-developed  and well-nourished.  HENT:  Head: Normocephalic and atraumatic.  Eyes: Conjunctivae normal and EOM are normal. Pupils are equal, round, and reactive to light.  Neck: Normal range of motion. Neck supple.  Cardiovascular: Normal rate, regular rhythm, normal heart sounds and intact distal pulses.   Pulmonary/Chest: Effort normal and breath sounds normal.  Abdominal: Soft. Bowel sounds are normal. There is tenderness (diffuse tenderness with left greater than right.  bloody stool at rectum.  ).  Musculoskeletal: Normal range of motion.  Neurological: She is alert and oriented to person, place, and time. She has normal reflexes.  Skin: Skin is warm and dry.  Psychiatric: She has a normal mood and affect. Her behavior is normal. Judgment and thought content normal.    ED Course  Procedures (including critical care time)  Labs Reviewed - No data to display No results found.  Results for orders placed during the hospital encounter of 09/10/12  CBC WITH DIFFERENTIAL      Component Value Range   WBC 19.2 (*) 4.0 - 10.5 K/uL   RBC 4.46  3.87 - 5.11 MIL/uL   Hemoglobin 13.9  12.0 - 15.0 g/dL   HCT 04.5  40.9 - 81.1 %   MCV 91.0  78.0 - 100.0 fL   MCH 31.2  26.0 - 34.0 pg   MCHC 34.2  30.0 - 36.0 g/dL   RDW 91.4  78.2 - 95.6 %   Platelets 240  150 - 400 K/uL   Neutrophils Relative 91 (*) 43 - 77 %   Neutro Abs 17.4 (*) 1.7 - 7.7 K/uL   Lymphocytes Relative 5 (*) 12 - 46 %   Lymphs Abs 0.9  0.7 - 4.0 K/uL   Monocytes Relative 4  3 - 12 %   Monocytes Absolute 0.9  0.1 - 1.0 K/uL   Eosinophils Relative 0  0 - 5 %   Eosinophils Absolute 0.0  0.0 - 0.7 K/uL   Basophils Relative 0  0 - 1 %   Basophils Absolute 0.0  0.0 - 0.1 K/uL  COMPREHENSIVE METABOLIC  PANEL      Component Value Range   Sodium 139  135 - 145 mEq/L   Potassium 3.9  3.5 - 5.1 mEq/L   Chloride 104  96 - 112 mEq/L   CO2 24  19 - 32 mEq/L   Glucose, Bld 151 (*) 70 - 99 mg/dL   BUN 31 (*) 6 - 23 mg/dL   Creatinine, Ser  1.61  0.50 - 1.10 mg/dL   Calcium 9.4  8.4 - 09.6 mg/dL   Total Protein 7.3  6.0 - 8.3 g/dL   Albumin 4.0  3.5 - 5.2 g/dL   AST 49 (*) 0 - 37 U/L   ALT 49 (*) 0 - 35 U/L   Alkaline Phosphatase 130 (*) 39 - 117 U/L   Total Bilirubin 0.4  0.3 - 1.2 mg/dL   GFR calc non Af Amer >90  >90 mL/min   GFR calc Af Amer >90  >90 mL/min  LIPASE, BLOOD      Component Value Range   Lipase 23  11 - 59 U/L  URINALYSIS, ROUTINE W REFLEX MICROSCOPIC      Component Value Range   Color, Urine YELLOW  YELLOW   APPearance CLEAR  CLEAR   Specific Gravity, Urine 1.034 (*) 1.005 - 1.030   pH 6.0  5.0 - 8.0   Glucose, UA NEGATIVE  NEGATIVE mg/dL   Hgb urine dipstick SMALL (*) NEGATIVE   Bilirubin Urine NEGATIVE  NEGATIVE   Ketones, ur NEGATIVE  NEGATIVE mg/dL   Protein, ur NEGATIVE  NEGATIVE mg/dL   Urobilinogen, UA 0.2  0.0 - 1.0 mg/dL   Nitrite NEGATIVE  NEGATIVE   Leukocytes, UA NEGATIVE  NEGATIVE  URINE MICROSCOPIC-ADD ON      Component Value Range   Squamous Epithelial / LPF FEW (*) RARE   WBC, UA 0-2  <3 WBC/hpf   RBC / HPF 0-2  <3 RBC/hpf   Bacteria, UA MANY (*) RARE    No diagnosis found.  Ct Abdomen Pelvis W Contrast  09/10/2012  *RADIOLOGY REPORT*  Clinical Data: Abdominal pain.  Cramping, blood in the stool. Nausea, vomiting.  Obesity, hypertension.  Prior hysterectomy.  CT ABDOMEN AND PELVIS WITH CONTRAST  Technique:  Multidetector CT imaging of the abdomen and pelvis was performed following the standard protocol during bolus administration of intravenous contrast.  Contrast: OMNIPAQUE IOHEXOL 300 MG/ML  SOLN  Comparison: None.  Findings: Images of the lung bases are unremarkable.  Heart size is mildly enlarged.  No focal abnormality identified within the liver, spleen, pancreas, adrenal glands, or right kidney.  Small low attenuation lesion within the left kidney is consistent with a small cyst.  The gallbladder is present.  No evidence for urinary tract obstruction. The transverse and  descending colon are markedly abnormal with thickened wall and mesenteric stranding.  Although there are scattered diverticula within this segment, the process is favored to be more diffuse, such as colitis.  Infectious/inflammatory, or ischemic causes should be considered.  The sigmoid colon contains scattered diverticula as well.  However, the sigmoid colon does not appear to be inflamed.  There are scattered mesenteric lymph nodes and retroperitoneal lymph nodes, possibly reactive.  The stomach and small bowel loops are normal in appearance.  The appendix is well seen and has a normal appearance.  Mild degenerative changes are seen in the spine.  No evidence for aortic aneurysm.  IMPRESSION:  1.  Marked abnormality of the transverse and descending colon, consistent with colitis.  Infectious/inflammatory,  or ischemic causes should be considered.  Diverticulitis is felt to be less likely. 2.  No evidence for free intraperitoneal air or abscess. 3.  Diverticulosis.   Original Report Authenticated By: Norva Pavlov, M.D.     MDM  Patient with abdominal pain and bloody diarrhea that began last night. She has an elevated white blood cell count at 19,000 and ACT of the abdomen with marked abnormality of the transverse and descending colon consistent with colitis. She is given Cipro and Flagyl here in the emergency department. She is given 1 L of normal saline and her blood pressure has remained adequate with a systolic pressure of 1:30 and heart rate has decreased to 103 to 90s  her pain has decreased with Dilaudid and Zofran. I will consult the hospitalists for admission, hydration, pain management, and further treatment of her colitis.       Hilario Quarry, MD 09/10/12 [phone]

## 2012-09-10 NOTE — Progress Notes (Signed)
Notified MD of pt's arrival to floor and room #.  [name]

## 2012-09-10 NOTE — ED Notes (Signed)
Attempted to call report.  Nurse will callback shortly.

## 2012-09-10 NOTE — ED Notes (Signed)
Pt states she ate out for dinner.  She relates she started to have severe abdominal pain, N/V.  Pt drank prune juice with coffee which started diarrhea.  Pt has intermittent abdominal cramping with bloody diarrhea.

## 2012-09-10 NOTE — Progress Notes (Signed)
Called for transfer of a patient with : 1. Colitis- given IVF, cipro/flagy- has been scoped in past by Dr. Russella Dar 2. Leukocytosis 3. abd pain 4. Bloody diarrhea  Vital signs stable- will place on med surg  [name]

## 2012-09-11 LAB — BASIC METABOLIC PANEL
CO2: 26 mEq/L (ref 19–32)
Calcium: 8.5 mg/dL (ref 8.4–10.5)
Creatinine, Ser: 0.83 mg/dL (ref 0.50–1.10)
GFR calc Af Amer: 89 mL/min — ABNORMAL LOW (ref >90)
Sodium: 138 mEq/L (ref 135–145)

## 2012-09-11 LAB — CBC
MCV: 91.7 fL (ref 78.0–100.0)
Platelets: 228 10*3/uL (ref 150–400)
RBC: 3.86 MIL/uL — ABNORMAL LOW (ref 3.87–5.11)
RDW: 13.8 % (ref 11.5–15.5)
WBC: 19.1 10*3/uL — ABNORMAL HIGH (ref 4.0–10.5)

## 2012-09-11 MED ORDER — HYDROMORPHONE HCL PF 1 MG/ML IJ SOLN
1.0000 mg | INTRAMUSCULAR | Status: DC | PRN
  Administered 2012-09-11 (×2): 1 mg via INTRAVENOUS
  Filled 2012-09-11 (×2): qty 1

## 2012-09-11 MED ORDER — HYDROMORPHONE HCL PF 1 MG/ML IJ SOLN
1.0000 mg | INTRAMUSCULAR | Status: DC | PRN
  Administered 2012-09-11: 1 mg via INTRAVENOUS
  Filled 2012-09-11: qty 1

## 2012-09-11 MED ORDER — BIOTENE DRY MOUTH MT LIQD
15.0000 mL | Freq: Two times a day (BID) | OROMUCOSAL | Status: DC
  Administered 2012-09-11 – 2012-09-14 (×4): 15 mL via OROMUCOSAL

## 2012-09-11 MED ORDER — HYDROMORPHONE HCL PF 1 MG/ML IJ SOLN
1.0000 mg | INTRAMUSCULAR | Status: DC | PRN
  Administered 2012-09-11: 1 mg via INTRAVENOUS
  Administered 2012-09-11: 2 mg via INTRAVENOUS
  Administered 2012-09-11 – 2012-09-14 (×15): 1 mg via INTRAVENOUS
  Filled 2012-09-11: qty 1
  Filled 2012-09-11: qty 2
  Filled 2012-09-11: qty 1
  Filled 2012-09-11: qty 2
  Filled 2012-09-11 (×14): qty 1

## 2012-09-11 MED ORDER — CHLORHEXIDINE GLUCONATE 0.12 % MT SOLN
15.0000 mL | Freq: Two times a day (BID) | OROMUCOSAL | Status: DC
  Administered 2012-09-11 – 2012-09-14 (×8): 15 mL via OROMUCOSAL
  Filled 2012-09-11 (×7): qty 15

## 2012-09-11 MED ORDER — POTASSIUM CHLORIDE 10 MEQ/100ML IV SOLN
10.0000 meq | INTRAVENOUS | Status: AC
  Administered 2012-09-11 (×3): 10 meq via INTRAVENOUS
  Filled 2012-09-11 (×3): qty 100

## 2012-09-11 NOTE — Progress Notes (Signed)
[facility] PROGRESS NOTE  [name] WUJ:[id number] DOB: 1956/01/17 DOA: 09/10/2012 PCP: Cala Bradford, MD  Brief narrative: Patient is a 57 year old female who presented with lower abdominal pain 1 day prior to this admission. She was in [employer] and shortly after eating felt sudden abdominal pain associated with nausea and diarrhea and even some blood in the stool. In ED, patient was found to have colitis in transverse and descending colon. She was started on cipro and flagyl on admission.  Assessment/Plan:  Principal Problem:  *Colitis  We will continue cipro and flagyl  Continue supportive care with IV fluids and antiemetics  Increase dilaudid to 1-2 mg Q 2 hours PRN IV   Active problems: Hypokalemia  Secondary to GI losses  repleted today  Follow up BMP in am  Code Status: full code Family Communication: husband updated at bedside Disposition Plan: home when stable  Manson Passey, MD  [facility] Pager [phone]  If 7PM-7AM, please contact night-coverage [email or web address] Password [email or web address] 09/11/2012, 4:38 PM   LOS: 1 day   Consultants:  None   Procedures:  None   Antibiotics:  Cipro 09/10/2012 -->  Flagyl 09/10/2012 -->  HPI/Subjective: Still has abdominal pain, nausea but no vomiting  Objective: Filed Vitals:   09/10/12 1813 09/10/12 2215 09/11/12 0644 09/11/12 1425  BP: 135/51 127/59 132/63 113/83  Pulse: 114 108 95 96  Temp: 98.7 F (37.1 C) 98.2 F (36.8 C) 98.8 F (37.1 C) 98.7 F (37.1 C)  TempSrc: Axillary Oral Oral   Resp: 20 18 18 18   SpO2: 94% 95% 92% 91%    Intake/Output Summary (Last 24 hours) at 09/11/12 1638 Last data filed at 09/11/12 0550  Gross per 24 hour  Intake   1443 ml  Output      0 ml  Net   1443 ml    Exam:   General:  Pt is alert, follows commands appropriately, not in acute distress  Cardiovascular: Regular rate and rhythm, S1/S2, no murmurs, no rubs, no gallops  Respiratory: Clear to auscultation  bilaterally, no wheezing, no crackles, no rhonchi  Abdomen: somewhat distended, tender across mid abdomen with guarding, bowel sounds present  Extremities: No edema, pulses DP and PT palpable bilaterally  Neuro: Grossly nonfocal  Data Reviewed: Basic Metabolic Panel:  Lab 09/11/12 8657 09/10/12 0755  NA 138 139  K 3.4* 3.9  CL 104 104  CO2 26 24  GLUCOSE 125* 151*  BUN 23 31*  CREATININE 0.83 0.70  CALCIUM 8.5 9.4  MG -- --  PHOS -- --   Liver Function Tests:  Lab 09/10/12 0755  AST 49*  ALT 49*  ALKPHOS 130*  BILITOT 0.4  PROT 7.3  ALBUMIN 4.0    Lab 09/10/12 0755  LIPASE 23  AMYLASE --   CBC:  Lab 09/11/12 0510 09/10/12 0755  WBC 19.1* 19.2*  NEUTROABS -- 17.4*  HGB 11.8* 13.9  HCT 35.4* 40.6  MCV 91.7 91.0  PLT 228 240    Studies: Ct Abdomen Pelvis W Contrast  09/10/2012  *RADIOLOGY REPORT*  Clinical Data: Abdominal pain.  Cramping, blood in the stool. Nausea, vomiting.  Obesity, hypertension.  Prior hysterectomy.  CT ABDOMEN AND PELVIS WITH CONTRAST  Technique:  Multidetector CT imaging of the abdomen and pelvis was performed following the standard protocol during bolus administration of intravenous contrast.  Contrast: OMNIPAQUE IOHEXOL 300 MG/ML  SOLN  Comparison: None.  Findings: Images of the lung bases are unremarkable.  Heart size is mildly  enlarged.  No focal abnormality identified within the liver, spleen, pancreas, adrenal glands, or right kidney.  Small low attenuation lesion within the left kidney is consistent with a small cyst.  The gallbladder is present.  No evidence for urinary tract obstruction. The transverse and descending colon are markedly abnormal with thickened wall and mesenteric stranding.  Although there are scattered diverticula within this segment, the process is favored to be more diffuse, such as colitis.  Infectious/inflammatory, or ischemic causes should be considered.  The sigmoid colon contains scattered diverticula as  well.  However, the sigmoid colon does not appear to be inflamed.  There are scattered mesenteric lymph nodes and retroperitoneal lymph nodes, possibly reactive.  The stomach and small bowel loops are normal in appearance.  The appendix is well seen and has a normal appearance.  Mild degenerative changes are seen in the spine.  No evidence for aortic aneurysm.  IMPRESSION:  1.  Marked abnormality of the transverse and descending colon, consistent with colitis.  Infectious/inflammatory, or ischemic causes should be considered.  Diverticulitis is felt to be less likely. 2.  No evidence for free intraperitoneal air or abscess. 3.  Diverticulosis.   Original Report Authenticated By: Norva Pavlov, M.D.     Scheduled Meds:   . antiseptic oral rinse  15 mL Mouth Rinse q12n4p  . chlorhexidine  15 mL Mouth Rinse BID  . ciprofloxacin  400 mg Intravenous Q12H  . metronidazole  500 mg Intravenous Q8H  . metroNIDAZOLE   Topical Daily  . potassium chloride  10 mEq Intravenous Q1 Hr x 3   Continuous Infusions:   . dextrose 5 % and 0.9% NaCl 75 mL/hr at 09/10/12 1355

## 2012-09-12 DIAGNOSIS — K219 Gastro-esophageal reflux disease without esophagitis: Secondary | ICD-10-CM

## 2012-09-12 MED ORDER — PANTOPRAZOLE SODIUM 40 MG PO TBEC
40.0000 mg | DELAYED_RELEASE_TABLET | Freq: Every day | ORAL | Status: DC
  Administered 2012-09-12 – 2012-09-13 (×2): 40 mg via ORAL
  Filled 2012-09-12 (×2): qty 1

## 2012-09-12 MED ORDER — CALCIUM CARBONATE ANTACID 500 MG PO CHEW
1.0000 | CHEWABLE_TABLET | Freq: Four times a day (QID) | ORAL | Status: DC | PRN
  Filled 2012-09-12: qty 1

## 2012-09-12 MED ORDER — CIPROFLOXACIN HCL 500 MG PO TABS
500.0000 mg | ORAL_TABLET | Freq: Two times a day (BID) | ORAL | Status: DC
  Administered 2012-09-13 – 2012-09-15 (×6): 500 mg via ORAL
  Filled 2012-09-12 (×11): qty 1

## 2012-09-12 MED ORDER — POTASSIUM CHLORIDE CRYS ER 20 MEQ PO TBCR
40.0000 meq | EXTENDED_RELEASE_TABLET | Freq: Two times a day (BID) | ORAL | Status: AC
  Administered 2012-09-12 (×2): 40 meq via ORAL
  Filled 2012-09-12 (×2): qty 2

## 2012-09-12 MED ORDER — METRONIDAZOLE 500 MG PO TABS
500.0000 mg | ORAL_TABLET | Freq: Three times a day (TID) | ORAL | Status: DC
  Administered 2012-09-12 – 2012-09-15 (×10): 500 mg via ORAL
  Filled 2012-09-12 (×16): qty 1

## 2012-09-12 NOTE — Progress Notes (Addendum)
Had blood per rectum x one today but mixed with urine

## 2012-09-12 NOTE — Progress Notes (Signed)
[facility] PROGRESS NOTE Assessment/Plan: Colitis (09/10/2012) - started on cipro and flagyl on 1.1.2013. - continue supportive care. - mild abdominal pain with food she will like to continue and advance her diet as tolerate it.  GERD (05/26/2009) - protonix  Hypertension (09/10/2012) - bp low continue to hold.  Hypokalemia: - replete.    Code Status: full code  Family Communication: husband updated at bedside  Disposition Plan: home when stable    Consultants:  none  Procedures:  none  Antibiotics: Cipro/  flagyl 1.1.2014  HPI/Subjective: Mild abdominal pain with clears  Objective: Filed Vitals:   09/11/12 0644 09/11/12 1425 09/11/12 2204 09/12/12 0639  BP: 132/63 113/83 148/71 97/43  Pulse: 95 96 93 89  Temp: 98.8 F (37.1 C) 98.7 F (37.1 C) 97.6 F (36.4 C) 98.8 F (37.1 C)  TempSrc: Oral  Oral Oral  Resp: 18 18 18 18   SpO2: 92% 91% 97% 95%    Intake/Output Summary (Last 24 hours) at 09/12/12 1224 Last data filed at 09/12/12 1610  Gross per 24 hour  Intake 1786.25 ml  Output    200 ml  Net 1586.25 ml   There were no vitals filed for this visit.  Exam:  General: Alert, awake, oriented x3, in no acute distress.  HEENT: No bruits, no goiter.  Heart: Regular rate and rhythm, without murmurs, rubs, gallops.  Lungs: Good air movement, CTA B/L Abdomen: Soft, tender LLQ. Neuro: Grossly intact, nonfocal.   Data Reviewed: Basic Metabolic Panel:  Lab 09/11/12 9604 09/10/12 0755  NA 138 139  K 3.4* 3.9  CL 104 104  CO2 26 24  GLUCOSE 125* 151*  BUN 23 31*  CREATININE 0.83 0.70  CALCIUM 8.5 9.4  MG -- --  PHOS -- --   Liver Function Tests:  Lab 09/10/12 0755  AST 49*  ALT 49*  ALKPHOS 130*  BILITOT 0.4  PROT 7.3  ALBUMIN 4.0    Lab 09/10/12 0755  LIPASE 23  AMYLASE --   No results found for this basename: AMMONIA:5 in the last 168 hours CBC:  Lab 09/11/12 0510 09/10/12 0755  WBC 19.1* 19.2*  NEUTROABS -- 17.4*  HGB  11.8* 13.9  HCT 35.4* 40.6  MCV 91.7 91.0  PLT 228 240   Cardiac Enzymes: No results found for this basename: CKTOTAL:5,CKMB:5,CKMBINDEX:5,TROPONINI:5 in the last 168 hours BNP (last 3 results) No results found for this basename: PROBNP:3 in the last 8760 hours CBG: No results found for this basename: GLUCAP:5 in the last 168 hours  No results found for this or any previous visit (from the past 240 hour(s)).   Studies: No results found.  Scheduled Meds:   . antiseptic oral rinse  15 mL Mouth Rinse q12n4p  . chlorhexidine  15 mL Mouth Rinse BID  . ciprofloxacin  400 mg Intravenous Q12H  . metronidazole  500 mg Intravenous Q8H  . metroNIDAZOLE   Topical Daily   Continuous Infusions:   . dextrose 5 % and 0.9% NaCl 75 mL/hr at 09/12/12 0525     Marinda Elk  [facility] Pager [phone]. If 8PM-8AM, please contact night-coverage at [email or web address], password [facility] 09/12/2012, 12:24 PM  LOS: 2 days

## 2012-09-13 DIAGNOSIS — E039 Hypothyroidism, unspecified: Secondary | ICD-10-CM

## 2012-09-13 LAB — CBC
HCT: 35.4 % — ABNORMAL LOW (ref 36.0–46.0)
Hemoglobin: 11.9 g/dL — ABNORMAL LOW (ref 12.0–15.0)
MCHC: 33.6 g/dL (ref 30.0–36.0)
RBC: 3.88 MIL/uL (ref 3.87–5.11)
WBC: 17.3 10*3/uL — ABNORMAL HIGH (ref 4.0–10.5)

## 2012-09-13 MED ORDER — LEVOTHYROXINE SODIUM 137 MCG PO TABS
137.0000 ug | ORAL_TABLET | Freq: Every day | ORAL | Status: DC
  Administered 2012-09-14 – 2012-09-15 (×2): 137 ug via ORAL
  Filled 2012-09-13 (×3): qty 1

## 2012-09-13 NOTE — Consult Note (Signed)
Consult for [name] [facility]  Reason for Consult: Colitis Referring Physician: [facility]  Thad Ranger HPI: This is a 57 year old female with a PMH of a tubular adenoma in 04/2011 who is admitted to the hospital for colitis.  Acutely after she ate at a [employer] on [place] she woke up at 1-2 AM with severe abdominal pain.  This rapidly progressed to hematochezia.  Her pain was diffuse and it was unrelenting.  Subsequently she presented to the ER with her complaints and a CT scan revealed a distal transverse colon colitis and descending colon colitis.  Her WBC was also elevated in the 19 range and she was started on antibiotics.  Since the afternoon of Jan 1st she denied any further diarrhea.  In fact, she only had a small and mildly bloody bowel movement just a couple of hours ago.  Her pain has markedly improved from a 10+ to 1-2 intermittently, however, she knows the pain will increase to a 4-5 if she ambulates.  Overall she feels well at this time.  No further issues with fever.  Dr. Betti Cruz was concerned that her WBC was still elevated, even though she has clinically improved.  Past Medical History  Diagnosis Date  . Diverticulosis   . Anemia   . Multiple thyroid nodules   . Hypothyroidism   . Migraines   . Allergic rhinitis   . Acne rosacea   . Osteoarthritis   . Obesity   . Hypertension   . Goiter   . Hip bursitis   . Tubular adenoma of colon 04/2011  . Shortness of breath     with exertion   . GERD (gastroesophageal reflux disease)   . Overactive bladder     carpal tunnel     Past Surgical History  Procedure Date  . Total abdominal hysterectomy w/ bilateral salpingoophorectomy 2005  . Nasal septum surgery   . Cautery of turbinates 1990's  . Partial knee replacement 04/2009    Left  . Knee arthroscopy 11/2010    Right  . Tubal ligation   . Thyroidectomy 05/08/2012    Procedure: THYROIDECTOMY;  Surgeon: Velora Heckler, MD;  Location: [facility];  Service:  General;  Laterality: N/A;  Total Thyriodectomy    Family History  Problem Relation Age of Onset  . Colon cancer Maternal Grandfather   . Diabetes Mother   . Heart disease Mother   . Breast cancer Mother   . Pulmonary embolism Father   . Hypertension Sister     Social History:  reports that she has never smoked. She has never used smokeless tobacco. She reports that she does not drink alcohol or use illicit drugs.  Allergies:  Allergies  Allergen Reactions  . Cephalexin Hives  . Zithromax (Azithromycin) Other (See Comments)    Gi upset     Medications:  Scheduled:   . antiseptic oral rinse  15 mL Mouth Rinse q12n4p  . chlorhexidine  15 mL Mouth Rinse BID  . ciprofloxacin  500 mg Oral BID  . levothyroxine  137 mcg Oral QAC breakfast  . metroNIDAZOLE  500 mg Oral Q8H  . metroNIDAZOLE   Topical Daily  . pantoprazole  40 mg Oral Daily   Continuous:   . dextrose 5 % and 0.9% NaCl 75 mL/hr at 09/13/12 1020    Results for orders placed during the hospital encounter of 09/10/12 (from the past 24 hour(s))  CBC     Status: Abnormal   Collection  Time   09/13/12  6:55 AM      Component Value Range   WBC 17.3 (*) 4.0 - 10.5 K/uL   RBC 3.88  3.87 - 5.11 MIL/uL   Hemoglobin 11.9 (*) 12.0 - 15.0 g/dL   HCT 16.1 (*) 09.6 - 04.5 %   MCV 91.2  78.0 - 100.0 fL   MCH 30.7  26.0 - 34.0 pg   MCHC 33.6  30.0 - 36.0 g/dL   RDW 40.9  81.1 - 91.4 %   Platelets 249  150 - 400 K/uL     No results found.  ROS:  As stated above in the HPI otherwise negative.  Blood pressure 139/64, pulse 88, temperature 97.7 F (36.5 C), temperature source Axillary, resp. rate 18, height 5\' 8"  (1.727 m), weight 204 lb 3.2 oz (92.625 kg), SpO2 94.00%.    PE: Gen: NAD, Alert and Oriented HEENT:  [place]/AT, EOMI Neck: Supple, no LAD Lungs: CTA Bilaterally CV: RRR without M/G/R ABM: Soft, NTND, +BS Ext: No C/C/E  Assessment/Plan: 1) Colitis. 2) History of tubular adenoma. 3) Diverticula.   I feel  her symptoms are from an infectious source, however, I cannot say for certain.  Ischemic colitis is a consideration, but the distribution in the transverse colon is unusual.  There is no evidence of any hypercoagulability.  In this situation I would follow her clinically.  It may be that her WBC will eventually normalize.  If she remains stable or continues to improve, I think she can be discharged home tomorrow with follow up as an outpatient.  Plan: 1) Continue with Cipro and Flagyl. 2) I will reassess in the AM.  Shanesha Bednarz D 09/13/2012, 4:41 PM

## 2012-09-13 NOTE — Progress Notes (Signed)
[facility] PROGRESS NOTE Assessment/Plan: Colitis - Continue on cipro and flagyl on 09/11/2011. - Continue supportive care.  Continue clear liquid diet. - Given minimal improvement in symptoms requested GI consultation (Dr. Elnoria Howard for [facility]) for further evaluation. - CT of abdomen pelvis with contrast on 09/10/2012 showed marked abnormality of transverse and descending colon consistent with colitis. - C. difficile PCR pending.  GERD - PPI.  Hypertension - blood pressure normotensive, continue to hold losartan.  Hypokalemia - Replete as needed.   Hypothyroidism - Restart levothyroxine, has not been on this medication since admission to.  Code Status: full code  Family Communication: No family at bedside.  Disposition Plan: home when stable  Consultants:  None  Procedures:  None  Antibiotics: Cipro/Flagyl 09/10/2012 >>  HPI/Subjective: Still having abdominal discomfort with intermittent episodes of loose bowel movements with blood.  Objective: Filed Vitals:   09/12/12 0834 09/12/12 1410 09/12/12 2236 09/13/12 0638  BP:  130/44 149/72 133/56  Pulse:  96 95 93  Temp:  98.9 F (37.2 C) 98.8 F (37.1 C) 98.7 F (37.1 C)  TempSrc:  Oral Oral Oral  Resp:  18 18 18   Height: 5\' 8"  (1.727 m)     Weight: 92.625 kg (204 lb 3.2 oz)     SpO2:  95% 96% 94%    Intake/Output Summary (Last 24 hours) at 09/13/12 1339 Last data filed at 09/13/12 0828  Gross per 24 hour  Intake 1987.5 ml  Output   3682 ml  Net -1694.5 ml   Filed Weights   09/12/12 0834  Weight: 92.625 kg (204 lb 3.2 oz)    Exam:  General: Alert, awake, oriented x3, in no acute distress.  HEENT: No bruits, no goiter.  Heart: Regular rate and rhythm, without murmurs, rubs, gallops.  Lungs: Good air movement, CTA B/L Abdomen: Soft, tender LLQ. Neuro: Grossly intact, nonfocal.   Data Reviewed: Basic Metabolic Panel:  Lab 09/11/12 1610 09/10/12 0755  NA 138 139  K 3.4* 3.9  CL 104 104  CO2  26 24  GLUCOSE 125* 151*  BUN 23 31*  CREATININE 0.83 0.70  CALCIUM 8.5 9.4  MG -- --  PHOS -- --   Liver Function Tests:  Lab 09/10/12 0755  AST 49*  ALT 49*  ALKPHOS 130*  BILITOT 0.4  PROT 7.3  ALBUMIN 4.0    Lab 09/10/12 0755  LIPASE 23  AMYLASE --   No results found for this basename: AMMONIA:5 in the last 168 hours CBC:  Lab 09/13/12 0655 09/11/12 0510 09/10/12 0755  WBC 17.3* 19.1* 19.2*  NEUTROABS -- -- 17.4*  HGB 11.9* 11.8* 13.9  HCT 35.4* 35.4* 40.6  MCV 91.2 91.7 91.0  PLT 249 228 240   Cardiac Enzymes: No results found for this basename: CKTOTAL:5,CKMB:5,CKMBINDEX:5,TROPONINI:5 in the last 168 hours BNP (last 3 results) No results found for this basename: PROBNP:3 in the last 8760 hours CBG: No results found for this basename: GLUCAP:5 in the last 168 hours  No results found for this or any previous visit (from the past 240 hour(s)).   Studies: No results found.  Scheduled Meds:    . antiseptic oral rinse  15 mL Mouth Rinse q12n4p  . chlorhexidine  15 mL Mouth Rinse BID  . ciprofloxacin  500 mg Oral BID  . metroNIDAZOLE  500 mg Oral Q8H  . metroNIDAZOLE   Topical Daily  . pantoprazole  40 mg Oral Daily   Continuous Infusions:    . dextrose 5 %  and 0.9% NaCl 75 mL/hr at 09/13/12 1020     [name] A  [facility] Pager [phone]. If 8PM-8AM, please contact night-coverage at [email or web address], password [facility] 09/13/2012, 1:39 PM  LOS: 3 days

## 2012-09-14 LAB — CBC
HCT: 38.4 % (ref 36.0–46.0)
MCV: 91 fL (ref 78.0–100.0)
Platelets: 281 10*3/uL (ref 150–400)
RBC: 4.22 MIL/uL (ref 3.87–5.11)
WBC: 12.6 10*3/uL — ABNORMAL HIGH (ref 4.0–10.5)

## 2012-09-14 LAB — BASIC METABOLIC PANEL
BUN: 6 mg/dL (ref 6–23)
CO2: 24 mEq/L (ref 19–32)
Chloride: 102 mEq/L (ref 96–112)
Creatinine, Ser: 0.81 mg/dL (ref 0.50–1.10)
GFR calc Af Amer: >90 mL/min (ref >90)
Potassium: 3.3 mEq/L — ABNORMAL LOW (ref 3.5–5.1)

## 2012-09-14 LAB — CLOSTRIDIUM DIFFICILE BY PCR: Toxigenic C. Difficile by PCR: NEGATIVE

## 2012-09-14 MED ORDER — FAMOTIDINE 40 MG PO TABS
40.0000 mg | ORAL_TABLET | Freq: Two times a day (BID) | ORAL | Status: DC
  Administered 2012-09-14 – 2012-09-15 (×3): 40 mg via ORAL
  Filled 2012-09-14 (×4): qty 1

## 2012-09-14 MED ORDER — GI COCKTAIL ~~LOC~~
30.0000 mL | Freq: Two times a day (BID) | ORAL | Status: DC | PRN
  Administered 2012-09-14: 30 mL via ORAL
  Filled 2012-09-14: qty 30

## 2012-09-14 MED ORDER — POTASSIUM CHLORIDE 10 MEQ/100ML IV SOLN
10.0000 meq | INTRAVENOUS | Status: AC
  Administered 2012-09-14: 10 meq via INTRAVENOUS
  Filled 2012-09-14: qty 100

## 2012-09-14 MED ORDER — OXYCODONE HCL 5 MG PO TABS
5.0000 mg | ORAL_TABLET | Freq: Four times a day (QID) | ORAL | Status: DC | PRN
  Administered 2012-09-14: 5 mg via ORAL
  Filled 2012-09-14: qty 1

## 2012-09-14 MED ORDER — OXYCODONE HCL 5 MG PO TABS
10.0000 mg | ORAL_TABLET | Freq: Four times a day (QID) | ORAL | Status: DC | PRN
  Administered 2012-09-15 (×2): 10 mg via ORAL
  Filled 2012-09-14 (×2): qty 2

## 2012-09-14 MED ORDER — OXYCODONE HCL 5 MG PO TABS
10.0000 mg | ORAL_TABLET | Freq: Once | ORAL | Status: AC
  Administered 2012-09-14: 10 mg via ORAL
  Filled 2012-09-14: qty 2

## 2012-09-14 MED ORDER — POTASSIUM CHLORIDE CRYS ER 20 MEQ PO TBCR
20.0000 meq | EXTENDED_RELEASE_TABLET | Freq: Once | ORAL | Status: AC
  Administered 2012-09-14: 20 meq via ORAL
  Filled 2012-09-14: qty 1

## 2012-09-14 NOTE — Progress Notes (Signed)
[facility] PROGRESS NOTE Assessment/Plan: Colitis - Continue on cipro and flagyl started on 09/11/2011. - Continue supportive care.  Advance diet to full liquid diet and advance as tolerated. - Appreciate GI, Dr. Haywood Pao input. - CT of abdomen pelvis with contrast on 09/10/2012 showed marked abnormality of transverse and descending colon consistent with colitis. - C. difficile PCR negative.  GERD - PPI discontinued, started on H2 blocker and GI cocktail  Hypertension - Blood pressure normotensive, continue to hold losartan.  Hypokalemia - Replete as needed.   Hypothyroidism - Continue levothyroxine.  Leukocytosis Improved.  Code Status: full code  Family Communication: No family at bedside.  Disposition Plan: home when stable, DC in 1-2 days?  Consultants:  GI Dr. Elnoria Howard covering for [facility].  Procedures:  None  Antibiotics: Cipro/Flagyl 09/10/2012 >>  HPI/Subjective: Feeling slightly better today.  Has had loose bowel movements.  Objective: Filed Vitals:   09/13/12 0638 09/13/12 1454 09/13/12 2131 09/14/12 0535  BP: 133/56 139/64 143/71 138/63  Pulse: 93 88 82 83  Temp: 98.7 F (37.1 C) 97.7 F (36.5 C) 99 F (37.2 C) 98.9 F (37.2 C)  TempSrc: Oral Axillary Oral   Resp: 18 18 19 18   Height:      Weight:      SpO2: 94% 94% 95% 93%    Intake/Output Summary (Last 24 hours) at 09/14/12 1145 Last data filed at 09/14/12 1040  Gross per 24 hour  Intake 1872.5 ml  Output   1400 ml  Net  472.5 ml   Filed Weights   09/12/12 0834  Weight: 92.625 kg (204 lb 3.2 oz)    Exam:  General: Alert, awake, oriented x3, in no acute distress.  HEENT: No bruits, no goiter.  Heart: Regular rate and rhythm, without murmurs, rubs, gallops.  Lungs: Good air movement, CTA B/L Abdomen: Soft, tender LLQ. Neuro: Grossly intact, nonfocal.   Data Reviewed: Basic Metabolic Panel:  Lab 09/14/12 1610 09/11/12 0510 09/10/12 0755  NA 138 138 139  K 3.3* 3.4* 3.9  CL  102 104 104  CO2 24 26 24   GLUCOSE 116* 125* 151*  BUN 6 23 31*  CREATININE 0.81 0.83 0.70  CALCIUM 8.4 8.5 9.4  MG -- -- --  PHOS -- -- --   Liver Function Tests:  Lab 09/10/12 0755  AST 49*  ALT 49*  ALKPHOS 130*  BILITOT 0.4  PROT 7.3  ALBUMIN 4.0    Lab 09/10/12 0755  LIPASE 23  AMYLASE --   No results found for this basename: AMMONIA:5 in the last 168 hours CBC:  Lab 09/14/12 0500 09/13/12 0655 09/11/12 0510 09/10/12 0755  WBC 12.6* 17.3* 19.1* 19.2*  NEUTROABS -- -- -- 17.4*  HGB 12.6 11.9* 11.8* 13.9  HCT 38.4 35.4* 35.4* 40.6  MCV 91.0 91.2 91.7 91.0  PLT 281 249 228 240   Cardiac Enzymes: No results found for this basename: CKTOTAL:5,CKMB:5,CKMBINDEX:5,TROPONINI:5 in the last 168 hours BNP (last 3 results) No results found for this basename: PROBNP:3 in the last 8760 hours CBG: No results found for this basename: GLUCAP:5 in the last 168 hours  No results found for this or any previous visit (from the past 240 hour(s)).   Studies: No results found.  Scheduled Meds:    . antiseptic oral rinse  15 mL Mouth Rinse q12n4p  . chlorhexidine  15 mL Mouth Rinse BID  . ciprofloxacin  500 mg Oral BID  . famotidine  40 mg Oral BID  . levothyroxine  137 mcg Oral QAC breakfast  . metroNIDAZOLE  500 mg Oral Q8H  . metroNIDAZOLE   Topical Daily  . potassium chloride  10 mEq Intravenous Q1 Hr x 2   Continuous Infusions:    . dextrose 5 % and 0.9% NaCl 75 mL/hr at 09/13/12 1020     Arantxa Piercey A  [facility] Pager [phone]. If 8PM-8AM, please contact night-coverage at [email or web address], password [facility] 09/14/2012, 11:45 AM  LOS: 4 days

## 2012-09-14 NOTE — Progress Notes (Signed)
Subjective: A diarrheal bowel movement yesterday evening.  No hematochezia.  No abdominal pain.  Objective: Vital signs in last 24 hours: Temp:  [97.7 F (36.5 C)-99 F (37.2 C)] 98.9 F (37.2 C) (01/05 0535) Pulse Rate:  [82-88] 83  (01/05 0535) Resp:  [18-19] 18  (01/05 0535) BP: (138-143)/(63-71) 138/63 mmHg (01/05 0535) SpO2:  [93 %-95 %] 93 % (01/05 0535) Last BM Date: 09/13/12  Intake/Output from previous day: 01/04 0701 - 01/05 0700 In: 1752.5 [I.V.:1752.5] Out: 1301 [Urine:1300; Stool:1] Intake/Output this shift:    General appearance: alert and no distress GI: soft, non-tender; bowel sounds normal; no masses,  no organomegaly  Lab Results:  Basename 09/13/12 0655  WBC 17.3*  HGB 11.9*  HCT 35.4*  PLT 249   BMET No results found for this basename: NA:3,K:3,CL:3,CO2:3,GLUCOSE:3,BUN:3,CREATININE:3,CALCIUM:3 in the last 72 hours LFT No results found for this basename: PROT,ALBUMIN,AST,ALT,ALKPHOS,BILITOT,BILIDIR,IBILI in the last 72 hours PT/INR No results found for this basename: LABPROT:2,INR:2 in the last 72 hours Hepatitis Panel No results found for this basename: HEPBSAG,HCVAB,HEPAIGM,HEPBIGM in the last 72 hours C-Diff No results found for this basename: CDIFFTOX:3 in the last 72 hours Fecal Lactopherrin No results found for this basename: FECLLACTOFRN in the last 72 hours  Studies/Results: No results found.  Medications:  Scheduled:   . antiseptic oral rinse  15 mL Mouth Rinse q12n4p  . chlorhexidine  15 mL Mouth Rinse BID  . ciprofloxacin  500 mg Oral BID  . levothyroxine  137 mcg Oral QAC breakfast  . metroNIDAZOLE  500 mg Oral Q8H  . metroNIDAZOLE   Topical Daily  . pantoprazole  40 mg Oral Daily   Continuous:   . dextrose 5 % and 0.9% NaCl 75 mL/hr at 09/13/12 1020    Assessment/Plan: 1) Colitis. 2) Diarrhea.   She had one bout of diarrhea last evening and a C. Diff sample was sent off.  If it is positive that would explain the  persistently elevated WBC.  Cipro can exacerbate/prolong C. Diff infection.  Cipro will be maintained or D/C'ed pending the results.  Clinically she appears well.  I think there is still a possibility for her to be D/C'ed home today with rapid outpatient follow up.  Plan: 1) Await C. Diff result. 2) D/C pantoprazole as there is no clear indication for the medication.  (The literature is weak about the association between C. Diff and PPIs.)  LOS: 4 days   Selinda Korzeniewski D 09/14/2012, 7:50 AM

## 2012-09-14 NOTE — Progress Notes (Signed)
Had to stop patient's potassium infusion as it became unbearably painful for her.  Called MD, will switch to po potassium.  Roland Rack, RN

## 2012-09-15 LAB — CBC
HCT: 37.5 % (ref 36.0–46.0)
Hemoglobin: 12.8 g/dL (ref 12.0–15.0)
MCHC: 34.1 g/dL (ref 30.0–36.0)
RBC: 4.18 MIL/uL (ref 3.87–5.11)

## 2012-09-15 LAB — BASIC METABOLIC PANEL
BUN: 8 mg/dL (ref 6–23)
CO2: 26 mEq/L (ref 19–32)
GFR calc non Af Amer: 73 mL/min — ABNORMAL LOW (ref >90)
Glucose, Bld: 104 mg/dL — ABNORMAL HIGH (ref 70–99)
Potassium: 3.5 mEq/L (ref 3.5–5.1)
Sodium: 140 mEq/L (ref 135–145)

## 2012-09-15 MED ORDER — OXYCODONE HCL 10 MG PO TABS: 10.0000 mg | ORAL_TABLET | Freq: Four times a day (QID) | ORAL | Status: DC | PRN

## 2012-09-15 MED ORDER — METRONIDAZOLE 500 MG PO TABS: 500.0000 mg | ORAL_TABLET | Freq: Three times a day (TID) | ORAL | Status: AC

## 2012-09-15 MED ORDER — ZOLPIDEM TARTRATE 10 MG PO TABS: 5.0000 mg | ORAL_TABLET | Freq: Every evening | ORAL | Status: DC | PRN

## 2012-09-15 MED ORDER — ONDANSETRON HCL 4 MG PO TABS: 4.0000 mg | ORAL_TABLET | Freq: Four times a day (QID) | ORAL | Status: DC | PRN

## 2012-09-15 MED ORDER — CIPROFLOXACIN HCL 500 MG PO TABS: 500.0000 mg | ORAL_TABLET | Freq: Two times a day (BID) | ORAL | Status: AC

## 2012-09-15 NOTE — Progress Notes (Signed)
DC home with husband, verbally understood dc instructions, no questions asked 

## 2012-09-15 NOTE — Discharge Summary (Signed)
Physician Discharge Summary  [name] MRN: [id number] DOB/AGE: March 16, 1956 57 y.o.  PCP: Cala Bradford, MD   Admit date: 09/10/2012 Discharge date: 09/15/2012  Discharge Diagnoses:     *Colitis Active Problems:  GERD  Thyroid cancer, follicular variant of papillary thyroid carcinoma  Hypertension  Rosacea  Hypothyroidism     Medication List     As of 09/15/2012  1:24 PM    TAKE these medications         ALEVE 220 MG Caps   Generic drug: Naproxen Sodium   Take 440 mg by mouth every 8 (eight) hours as needed. pain      ALIGN 4 MG Caps   Take 1 capsule by mouth every morning.      AMBULATORY NON FORMULARY MEDICATION   Medication Name: CoHosh 541 mg. Take 1 capsule once daily      calcium carbonate 1250 MG tablet   Commonly known as: OS-CAL - dosed in mg of elemental calcium   Take 2 tablets (1,000 mg of elemental calcium total) by mouth 2 (two) times daily.      ciprofloxacin 500 MG tablet   Commonly known as: CIPRO   Take 1 tablet (500 mg total) by mouth 2 (two) times daily.      fluticasone 50 MCG/ACT nasal spray   Commonly known as: FLONASE   Place 2 sprays into the nose daily.      lansoprazole 15 MG capsule   Commonly known as: PREVACID   Take 15 mg by mouth daily.      levothyroxine 137 MCG tablet   Commonly known as: SYNTHROID, LEVOTHROID   Take 137 mcg by mouth daily.      losartan 50 MG tablet   Commonly known as: COZAAR   Take 50 mg by mouth every morning.      metroNIDAZOLE 0.75 % cream   Commonly known as: METROCREAM   Apply 1 application topically daily.      metroNIDAZOLE 500 MG tablet   Commonly known as: FLAGYL   Take 1 tablet (500 mg total) by mouth every 8 (eight) hours.      ondansetron 4 MG tablet   Commonly known as: ZOFRAN   Take 1 tablet (4 mg total) by mouth every 6 (six) hours as needed for nausea.      tolterodine 4 MG 24 hr capsule   Commonly known as: DETROL LA   Take 4 mg by mouth every morning.     Vitamin D 400 UNITS capsule   Take 400 Units by mouth daily.      zolpidem 10 MG tablet   Commonly known as: AMBIEN   Take 5 mg by mouth at bedtime as needed. For sleep.        Discharge Condition: Stable Disposition: 01-Home or Self Care   Consults: *Gastroenterology   Significant Diagnostic Studies: Ct Abdomen Pelvis W Contrast  09/10/2012  *RADIOLOGY REPORT*  Clinical Data: Abdominal pain.  Cramping, blood in the stool. Nausea, vomiting.  Obesity, hypertension.  Prior hysterectomy.  CT ABDOMEN AND PELVIS WITH CONTRAST  Technique:  Multidetector CT imaging of the abdomen and pelvis was performed following the standard protocol during bolus administration of intravenous contrast.  Contrast: OMNIPAQUE IOHEXOL 300 MG/ML  SOLN  Comparison: None.  Findings: Images of the lung bases are unremarkable.  Heart size is mildly enlarged.  No focal abnormality identified within the liver, spleen, pancreas, adrenal glands, or right kidney.  Small low attenuation lesion within  the left kidney is consistent with a small cyst.  The gallbladder is present.  No evidence for urinary tract obstruction. The transverse and descending colon are markedly abnormal with thickened wall and mesenteric stranding.  Although there are scattered diverticula within this segment, the process is favored to be more diffuse, such as colitis.  Infectious/inflammatory, or ischemic causes should be considered.  The sigmoid colon contains scattered diverticula as well.  However, the sigmoid colon does not appear to be inflamed.  There are scattered mesenteric lymph nodes and retroperitoneal lymph nodes, possibly reactive.  The stomach and small bowel loops are normal in appearance.  The appendix is well seen and has a normal appearance.  Mild degenerative changes are seen in the spine.  No evidence for aortic aneurysm.  IMPRESSION:  1.  Marked abnormality of the transverse and descending colon, consistent with colitis.   Infectious/inflammatory, or ischemic causes should be considered.  Diverticulitis is felt to be less likely. 2.  No evidence for free intraperitoneal air or abscess. 3.  Diverticulosis.   Original Report Authenticated By: Norva Pavlov, M.D.        Microbiology: Recent Results (from the past 240 hour(s))  CLOSTRIDIUM DIFFICILE BY PCR     Status: Normal   Collection Time   09/13/12  9:57 PM      Component Value Range Status Comment   C difficile by pcr NEGATIVE  NEGATIVE Final      Labs: Results for orders placed during the hospital encounter of 09/10/12 (from the past 48 hour(s))  CLOSTRIDIUM DIFFICILE BY PCR     Status: Normal   Collection Time   09/13/12  9:57 PM      Component Value Range Comment   C difficile by pcr NEGATIVE  NEGATIVE   CBC     Status: Abnormal   Collection Time   09/14/12  5:00 AM      Component Value Range Comment   WBC 12.6 (*) 4.0 - 10.5 K/uL    RBC 4.22  3.87 - 5.11 MIL/uL    Hemoglobin 12.6  12.0 - 15.0 g/dL    HCT 47.8  29.5 - 62.1 %    MCV 91.0  78.0 - 100.0 fL    MCH 29.9  26.0 - 34.0 pg    MCHC 32.8  30.0 - 36.0 g/dL    RDW 30.8  65.7 - 84.6 %    Platelets 281  150 - 400 K/uL   BASIC METABOLIC PANEL     Status: Abnormal   Collection Time   09/14/12  5:00 AM      Component Value Range Comment   Sodium 138  135 - 145 mEq/L    Potassium 3.3 (*) 3.5 - 5.1 mEq/L    Chloride 102  96 - 112 mEq/L    CO2 24  19 - 32 mEq/L    Glucose, Bld 116 (*) 70 - 99 mg/dL    BUN 6  6 - 23 mg/dL    Creatinine, Ser 9.62  0.50 - 1.10 mg/dL    Calcium 8.4  8.4 - 95.2 mg/dL    GFR calc non Af Amer 79 (*) >90 mL/min    GFR calc Af Amer >90  >90 mL/min   CBC     Status: Abnormal   Collection Time   09/15/12  6:18 AM      Component Value Range Comment   WBC 11.0 (*) 4.0 - 10.5 K/uL    RBC 4.18  3.87 -  5.11 MIL/uL    Hemoglobin 12.8  12.0 - 15.0 g/dL    HCT 16.1  09.6 - 04.5 %    MCV 89.7  78.0 - 100.0 fL    MCH 30.6  26.0 - 34.0 pg    MCHC 34.1  30.0 - 36.0 g/dL      RDW 40.9  81.1 - 91.4 %    Platelets 281  150 - 400 K/uL   BASIC METABOLIC PANEL     Status: Abnormal   Collection Time   09/15/12  6:18 AM      Component Value Range Comment   Sodium 140  135 - 145 mEq/L    Potassium 3.5  3.5 - 5.1 mEq/L    Chloride 103  96 - 112 mEq/L    CO2 26  19 - 32 mEq/L    Glucose, Bld 104 (*) 70 - 99 mg/dL    BUN 8  6 - 23 mg/dL    Creatinine, Ser 7.82  0.50 - 1.10 mg/dL    Calcium 8.8  8.4 - 95.6 mg/dL    GFR calc non Af Amer 73 (*) >90 mL/min    GFR calc Af Amer 84 (*) >90 mL/min   MAGNESIUM     Status: Normal   Collection Time   09/15/12  6:18 AM      Component Value Range Comment   Magnesium 1.8  1.5 - 2.5 mg/dL      HPI :57 year old female with a PMH of a tubular adenoma in 04/2011 who is admitted to the hospital for colitis. Acutely after she ate at a [employer] on [place] Years [place] she woke up at 1-2 AM with severe abdominal pain. This rapidly progressed to hematochezia. Her pain was diffuse and it was unrelenting. Subsequently she presented to the ER with her complaints and a CT scan revealed a distal transverse colon colitis and descending colon colitis. Her WBC was also elevated in the 19 range and she was started on antibiotics. Since the afternoon of Jan 1st she denied any further diarrhea. In fact, she only had a small and mildly bloody bowel movement just a couple of hours ago. Her pain has markedly improved from a 10+ to 1-2 intermittently, however, she knows the pain will increase to a 4-5 if she ambulates. Overall she feels well at this time.   HOSPITAL COURSE:  Colitis - Continue on cipro and flagyl started on 09/10/2012. Continue antibiotics until 09/19/12 - Continue supportive care. Tolerating advance diet - Appreciate GI, Dr. Haywood Pao input.  - CT of abdomen pelvis with contrast on 09/10/2012 showed marked abnormality of transverse and descending colon consistent with colitis. The need an outpatient colonoscopy, see GI in 2 weeks - C.  difficile PCR negative.  GERD - PPI discontinued, started on H2 blocker and GI cocktail  Hypertension - Blood pressure normotensive, continue to hold losartan.  Hypokalemia  - Replete as needed.  Hypothyroidism  - Continue levothyroxine.  Leukocytosis  Improved.    Discharge Exam:  Blood pressure 135/67, pulse 73, temperature 98.7 F (37.1 C), temperature source Oral, resp. rate 17, height 5\' 8"  (1.727 m), weight 92.625 kg (204 lb 3.2 oz), SpO2 94.00%.   General: Alert, awake, oriented x3, in no acute distress.  HEENT: No bruits, no goiter.  Heart: Regular rate and rhythm, without murmurs, rubs, gallops.  Lungs: Good air movement, CTA B/L  Abdomen: Soft, tender LLQ.  Neuro: Grossly intact, nonfocal.       Discharge  Orders    Future Appointments: Provider: Department: Dept Phone: Center:   09/22/2012 11:15 AM Velora Heckler, MD [facility], [place] [phone] None     Future Orders Please Complete By Expires   Diet - low sodium heart healthy      Increase activity slowly           Signed: Richarda Overlie 09/15/2012, 1:24 PM

## 2012-09-15 NOTE — Progress Notes (Signed)
[facility] Progress Note    Since last GI note: C. Diff by PCR was negative.  Pain is continuing to improve.  Diarrhea improving, bleeding has stopped.  Anorexia with mild nausea.  She feels ok about going home today  Objective: Vital signs in last 24 hours: Temp:  [98.1 F (36.7 C)-98.7 F (37.1 C)] 98.7 F (37.1 C) (01/06 0516) Pulse Rate:  [73-78] 73  (01/06 0516) Resp:  [17-18] 17  (01/06 0516) BP: (129-141)/(52-70) 135/67 mmHg (01/06 0516) SpO2:  [93 %-95 %] 94 % (01/06 0516) Last BM Date: 09/13/12 General: alert and oriented times 3 Heart: regular rate and rythm Abdomen: soft, non-tender, non-distended, normal bowel sounds   Lab Results:  Basename 09/15/12 0618 09/14/12 0500 09/13/12 0655  WBC 11.0* 12.6* 17.3*  HGB 12.8 12.6 11.9*  PLT 281 281 249  MCV 89.7 91.0 91.2    Basename 09/15/12 0618 09/14/12 0500  NA 140 138  K 3.5 3.3*  CL 103 102  CO2 26 24  GLUCOSE 104* 116*  BUN 8 6  CREATININE 0.87 0.81  CALCIUM 8.8 8.4    Medications: Scheduled Meds:   . ciprofloxacin  500 mg Oral BID  . famotidine  40 mg Oral BID  . levothyroxine  137 mcg Oral QAC breakfast  . metroNIDAZOLE  500 mg Oral Q8H  . metroNIDAZOLE   Topical Daily   Continuous Infusions:   . dextrose 5 % and 0.9% NaCl 75 mL/hr at 09/14/12 1340   PRN Meds:.calcium carbonate, diphenhydrAMINE, gi cocktail, HYDROmorphone (DILAUDID) injection, ondansetron (ZOFRAN) IV, ondansetron, oxyCODONE    Assessment/Plan: 57 y.o. female acute colitis, likely infectious  OK to d/c home today with 3 more days of bid cipro, tid flagyl.  Would prescribe limited script for po pain meds and antiemetics (such as zofran).    Rob Bunting, MD  09/15/2012, 12:59 PM [facility] Pager [phone]

## 2012-09-19 ENCOUNTER — Telehealth: Payer: Self-pay | Admitting: Gastroenterology

## 2012-09-19 NOTE — Telephone Encounter (Signed)
All questions about colitis answered.  She is feeling better.  She is scheduled for a post hospital visit with Dr/ Russella Dar

## 2012-09-22 ENCOUNTER — Encounter (INDEPENDENT_AMBULATORY_CARE_PROVIDER_SITE_OTHER): Payer: Managed Care, Other (non HMO) | Admitting: Surgery

## 2012-10-07 ENCOUNTER — Encounter: Payer: Self-pay | Admitting: Gastroenterology

## 2012-10-07 ENCOUNTER — Ambulatory Visit (INDEPENDENT_AMBULATORY_CARE_PROVIDER_SITE_OTHER): Payer: BC Managed Care – PPO | Admitting: Gastroenterology

## 2012-10-07 VITALS — BP 120/80 | HR 80 | Ht 68.0 in | Wt 199.0 lb

## 2012-10-07 DIAGNOSIS — K5289 Other specified noninfective gastroenteritis and colitis: Secondary | ICD-10-CM

## 2012-10-07 MED ORDER — RESTORA PO CAPS: 1.0000 | ORAL_CAPSULE | Freq: Every day | ORAL | Status: DC

## 2012-10-07 MED ORDER — HYOSCYAMINE SULFATE 0.125 MG SL SUBL: SUBLINGUAL_TABLET | SUBLINGUAL | Status: DC

## 2012-10-07 NOTE — Progress Notes (Signed)
History of Present Illness: This is a 57 year old female who was recently hospitalized with an acute colitis felt likely to be infectious. She was treated with antibiotics and her symptoms have resolved. Since her discharge she has noted problems when eating out with diarrhea following meals. She occasionally had similar problems in the past but only with [name] food or [name] food. She notes occasional mild crampy lower abdominal pain associated with diarrhea.  Current Medications, Allergies, Past Medical History, Past Surgical History, Family History and Social History were reviewed in [employer] record.  Physical Exam: General: Well developed , well nourished, no acute distress Head: Normocephalic and atraumatic Eyes:  sclerae anicteric, EOMI Ears: Normal auditory acuity Mouth: No deformity or lesions Lungs: Clear throughout to auscultation Heart: Regular rate and rhythm; no murmurs, rubs or bruits Abdomen: Soft, non tender and non distended. No masses, hepatosplenomegaly or hernias noted. Normal Bowel sounds Musculoskeletal: Symmetrical with no gross deformities  Pulses:  Normal pulses noted Extremities: No clubbing, cyanosis, edema or deformities noted Neurological: Alert oriented x 4, grossly nonfocal Psychological:  Alert and cooperative. Normal mood and affect  Assessment and Recommendations:  1. Acute colitis, likely infectious-resolved. She may have a postinfectious diarrhea. Trial of a different probiotic. Trial of Levsin 1-2 before meals as needed. I've advised her to call if her symptoms persist.

## 2012-10-07 NOTE — Patient Instructions (Addendum)
We have sent the following medications to your pharmacy for you to pick up at your convenience: Levsin, Restora.  You have been given a Restora coupon.   Thank you for choosing me and [facility].  Venita Lick. Pleas Koch., MD., Clementeen Graham

## 2012-11-27 ENCOUNTER — Ambulatory Visit (INDEPENDENT_AMBULATORY_CARE_PROVIDER_SITE_OTHER): Payer: BC Managed Care – PPO | Admitting: Family Medicine

## 2012-11-27 ENCOUNTER — Encounter: Payer: Self-pay | Admitting: Family Medicine

## 2012-11-27 VITALS — Ht 66.5 in | Wt 200.2 lb

## 2012-11-27 DIAGNOSIS — K529 Noninfective gastroenteritis and colitis, unspecified: Secondary | ICD-10-CM

## 2012-11-27 DIAGNOSIS — E669 Obesity, unspecified: Secondary | ICD-10-CM | POA: Insufficient documentation

## 2012-11-27 DIAGNOSIS — K5289 Other specified noninfective gastroenteritis and colitis: Secondary | ICD-10-CM

## 2012-11-27 NOTE — Progress Notes (Signed)
Medical Nutrition Therapy:  Appt start time: 1430 and 1500 end time:  1600.  Assessment:  Primary concerns today: Weight management and colitis mgmt.  [name] was accompanied by [name] today.  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  She would like to work on weight loss, which she knows would help her BP and her knee pain.  She was hospitalized w/ colitis in January, and is looking for some guidance in terms of foods to choose/avoid.  She has noticed digestive problems with [name] and [name] foods.  She does not like most vegetables.  Never gained wt till a few years ago once thyroid problems started.  Thyroidectomy last Aug; path report showed calcification and carcinoma.      Usual eating pattern includes 3 meals and 1-4 snacks per day.   Usual physical activity includes little b/c of knee pain (has had surg on both knees).     Everyday foods include 2-3 bottles water, Carnation Instant Bkfst in 8 oz 1% milk, 1 c decaf coffee w/ 2 Splenda pkts.  Avoided foods include spicy foods, most pizza, most salads, most veg's, salmon (b/c of GI response), alcohol.    24-hr recall: (Up at 6:30 AM) B (7 AM)-   Instant Bkfst in 8 oz 1% milk Snk (10:30)-   Granola bar L (1 PM)-  BK Whopper Jr w/ cheese w/ ketchup & mayo, water  Snk (4 PM)-  Birthday cake, 16 oz 1% milk D (7 PM)-  2 breaded chx tenders, chs off top of pizza Snk (11 PM)-  Birthday cake Atypical in that there was b'day cake.  More common would be Raisin Bran Crunch, Cocoa Puffs, Special K, w/ milk.  Lunch is hit or miss; not usually packing a lunch.  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   Last coloscopy was last spring b/c she'd been having digestive pblms, usually diarrhea.    Progress Towards Goal(s):  In progress.   Nutritional Diagnosis:  NI-5.8.3 Inappropriate intake of types of carbohydrates (specify):  vegetables As  related to little or no veg.  As evidenced by little or no veg's consumed on most days.    Intervention:  Nutrition education.  Monitoring/Evaluation:  Dietary intake, exercise, and body weight in 3 week(s).

## 2012-11-27 NOTE — Patient Instructions (Addendum)
-   A way to help get yourself more disciplined might be to start an exercise program.    - Recommendation:  Matthew Saras, Never Too Late [employer]:  [phone] - Ultimate goal:  Obtain twice as many veg's as protein or carbohydrate foods for both lunch and dinner.  - This means to slowly and progressively increase the veg's and fruits in your diet.    - TASTE PREFERENCES ARE LEARNED.  This means that it will get easier to choose foods you know are good for you if you are exposed to them enough.   - Veg's:  Try roasting veg's.     - Starting point:  Include a fruit or veg with both lunch and dinner.   - Track your symptoms, and if you have any symptoms, then write down preceding food intake, including time of eating, what, and amount.   - For your blood pressure, limit Na to 1500 mg per day.

## 2012-12-15 ENCOUNTER — Ambulatory Visit: Payer: BC Managed Care – PPO | Admitting: Family Medicine

## 2012-12-27 ENCOUNTER — Encounter (HOSPITAL_BASED_OUTPATIENT_CLINIC_OR_DEPARTMENT_OTHER): Payer: Self-pay | Admitting: *Deleted

## 2012-12-27 ENCOUNTER — Emergency Department (HOSPITAL_BASED_OUTPATIENT_CLINIC_OR_DEPARTMENT_OTHER)
Admission: EM | Admit: 2012-12-27 | Discharge: 2012-12-27 | Disposition: A | Discharge status: Home / Self Care | Payer: BC Managed Care – PPO | Attending: Emergency Medicine | Admitting: Emergency Medicine

## 2012-12-27 DIAGNOSIS — R059 Cough, unspecified: Secondary | ICD-10-CM | POA: Insufficient documentation

## 2012-12-27 DIAGNOSIS — Z79899 Other long term (current) drug therapy: Secondary | ICD-10-CM | POA: Insufficient documentation

## 2012-12-27 DIAGNOSIS — H5789 Other specified disorders of eye and adnexa: Secondary | ICD-10-CM | POA: Insufficient documentation

## 2012-12-27 DIAGNOSIS — Z8739 Personal history of other diseases of the musculoskeletal system and connective tissue: Secondary | ICD-10-CM | POA: Insufficient documentation

## 2012-12-27 DIAGNOSIS — Z862 Personal history of diseases of the blood and blood-forming organs and certain disorders involving the immune mechanism: Secondary | ICD-10-CM | POA: Insufficient documentation

## 2012-12-27 DIAGNOSIS — H1130 Conjunctival hemorrhage, unspecified eye: Secondary | ICD-10-CM | POA: Insufficient documentation

## 2012-12-27 DIAGNOSIS — K219 Gastro-esophageal reflux disease without esophagitis: Secondary | ICD-10-CM | POA: Insufficient documentation

## 2012-12-27 DIAGNOSIS — Z872 Personal history of diseases of the skin and subcutaneous tissue: Secondary | ICD-10-CM | POA: Insufficient documentation

## 2012-12-27 DIAGNOSIS — Z8639 Personal history of other endocrine, nutritional and metabolic disease: Secondary | ICD-10-CM | POA: Insufficient documentation

## 2012-12-27 DIAGNOSIS — Z8719 Personal history of other diseases of the digestive system: Secondary | ICD-10-CM | POA: Insufficient documentation

## 2012-12-27 DIAGNOSIS — Z8679 Personal history of other diseases of the circulatory system: Secondary | ICD-10-CM | POA: Insufficient documentation

## 2012-12-27 DIAGNOSIS — E669 Obesity, unspecified: Secondary | ICD-10-CM | POA: Insufficient documentation

## 2012-12-27 DIAGNOSIS — R05 Cough: Secondary | ICD-10-CM | POA: Insufficient documentation

## 2012-12-27 DIAGNOSIS — Z87448 Personal history of other diseases of urinary system: Secondary | ICD-10-CM | POA: Insufficient documentation

## 2012-12-27 DIAGNOSIS — H1132 Conjunctival hemorrhage, left eye: Secondary | ICD-10-CM

## 2012-12-27 DIAGNOSIS — I10 Essential (primary) hypertension: Secondary | ICD-10-CM | POA: Insufficient documentation

## 2012-12-27 MED ORDER — TETRACAINE HCL 0.5 % OP SOLN
OPHTHALMIC | Status: AC
  Filled 2012-12-27: qty 2

## 2012-12-27 NOTE — ED Provider Notes (Signed)
History  This chart was scribed for Dione Booze, MD by Bennett Scrape, ED Scribe. This patient was seen in room MH05/MH05 and the patient's care was started at 5:51 PM.  CSN: [id number]  Arrival date & time 12/27/12  1557   First MD Initiated Contact with Patient 12/27/12 1751      Chief Complaint  Patient presents with  . Eye Problem    The history is provided by the patient. No language interpreter was used.    [name] is a 57 y.o. female who presents to the Emergency Department complaining of  gradual onset, gradually worsening, constant left eye redness that she noticed last night. She denies any recent trauma to the left eye and involvement with the right eye. She denies eye pain and visual disturbances as associated symptoms. She reports an intermittent cough for the past few months due to allergies but denies sneezing, eye drainage and fevers. She reports taking Excedrin for HAs over the past few weeks but denies being on anticoagulants currently or recent ASA use. She has a h/o anemia, GERD, HTN and hypothyroidism. She denies smoking and alcohol use.   Past Medical History  Diagnosis Date  . Diverticulosis   . Anemia   . Multiple thyroid nodules   . Hypothyroidism   . Migraines   . Allergic rhinitis   . Acne rosacea   . Osteoarthritis   . Obesity   . Hypertension   . Goiter   . Hip bursitis   . Tubular adenoma of colon 04/2011  . Shortness of breath     with exertion   . GERD (gastroesophageal reflux disease)   . Overactive bladder     carpal tunnel   . Colitis     Past Surgical History  Procedure Laterality Date  . Total abdominal hysterectomy w/ bilateral salpingoophorectomy  2005  . Nasal septum surgery    . Cautery of turbinates  1990's  . Partial knee replacement  04/2009    Left  . Knee arthroscopy  11/2010    Right  . Tubal ligation    . Thyroidectomy  05/08/2012    Procedure: THYROIDECTOMY;  Surgeon: Velora Heckler, MD;  Location: [facility];   Service: General;  Laterality: N/A;  Total Thyriodectomy    Family History  Problem Relation Age of Onset  . Colon cancer Maternal Grandfather   . Diabetes Mother   . Heart disease Mother   . Breast cancer Mother   . Pulmonary embolism Father   . Hypertension Sister     History  Substance Use Topics  . Smoking status: Never Smoker   . Smokeless tobacco: Never Used  . Alcohol Use: No    No OB history provided.  Review of Systems  Constitutional: Negative for fever.  HENT: Negative for congestion and sneezing.   Eyes: Positive for redness. Negative for pain, discharge and visual disturbance.  Respiratory: Positive for cough.   All other systems reviewed and are negative.    Allergies  Cephalexin and Zithromax  Home Medications   Current Outpatient Rx  Name  Route  Sig  Dispense  Refill  . AMBULATORY NON FORMULARY MEDICATION      Medication Name: CoHosh 541 mg. Take 1 capsule once daily         . calcium carbonate (OS-CAL - DOSED IN MG OF ELEMENTAL CALCIUM) 1250 MG tablet   Oral   Take 2 tablets (1,000 mg of elemental calcium total) by mouth 2 (two) times daily.  60 tablet   1   . Cholecalciferol (VITAMIN D) 400 UNITS capsule   Oral   Take 400 Units by mouth daily.         . fluticasone (FLONASE) 50 MCG/ACT nasal spray   Nasal   Place 2 sprays into the nose daily.          . hyoscyamine (LEVSIN SL) 0.125 MG SL tablet      Take 1-2 tablets by mouth before meals as needed   90 tablet   11   . lansoprazole (PREVACID) 15 MG capsule   Oral   Take 15 mg by mouth daily.         Marland Kitchen levothyroxine (SYNTHROID, LEVOTHROID) 150 MCG tablet   Oral   Take 150 mcg by mouth daily.         Marland Kitchen losartan (COZAAR) 50 MG tablet   Oral   Take 50 mg by mouth every morning.          . metroNIDAZOLE (METROCREAM) 0.75 % cream   Topical   Apply 1 application topically daily.          . Naproxen Sodium (ALEVE) 220 MG CAPS   Oral   Take 440 mg by mouth every  8 (eight) hours as needed. pain         . Probiotic Product (RESTORA) CAPS   Oral   Take 1 capsule by mouth daily.   30 capsule   11   . tolterodine (DETROL LA) 4 MG 24 hr capsule   Oral   Take 4 mg by mouth every morning.          . zolpidem (AMBIEN) 10 MG tablet   Oral   Take 0.5 tablets (5 mg total) by mouth at bedtime as needed. For sleep.   30 tablet   0     Triage Vitals: BP 146/63  Pulse 76  Temp(Src) 98.1 F (36.7 C) (Oral)  Resp 16  Ht 5\' 7"  (1.702 m)  Wt 195 lb (88.451 kg)  BMI 30.53 kg/m2  SpO2 96%  Physical Exam  Nursing note and vitals reviewed. Constitutional: She is oriented to person, place, and time. She appears well-developed and well-nourished. No distress.  HENT:  Head: Normocephalic and atraumatic.  Eyes: EOM are normal.  subconjunctival hemorrhage in the lateral aspect of the left eye  Neck: Neck supple. No tracheal deviation present.  Cardiovascular: Normal rate.   Pulmonary/Chest: Effort normal. No respiratory distress.  Musculoskeletal: Normal range of motion.  Neurological: She is alert and oriented to person, place, and time.  Skin: Skin is warm and dry.  Psychiatric: She has a normal mood and affect. Her behavior is normal.    ED Course  Procedures (including critical care time)  DIAGNOSTIC STUDIES: Oxygen Saturation is 96% on RA, normal by my interpretation.    COORDINATION OF CARE: 5:58 PM-Informed pt that her symptoms are mostly likely a subconjunctival hemorrhage and that the symptoms are not dangerous. Advised pt that there are no medications that can improve the symptoms and that she should avoid taking ASA. Discussed discharge plan with pt and pt agreed to plan.   1. Non-traumatic subconjunctival hemorrhage, left       MDM  Spontaneous subconjunctival hemorrhage of the left eye. No evidence of any other pathology. Patient is reassured of benign nature of her condition.   I personally performed the services  described in this documentation, which was scribed in my presence. The recorded information has been reviewed  and is accurate.'     Dione Booze, MD 12/31/12 [phone]

## 2012-12-27 NOTE — ED Notes (Addendum)
Pt noticed her sclera was reddened last p.m. [facility]. No visual changes. PERL. No known trauma. "Accidentally doubled up on my meds Thursday."

## 2013-06-22 ENCOUNTER — Encounter (HOSPITAL_BASED_OUTPATIENT_CLINIC_OR_DEPARTMENT_OTHER): Payer: Self-pay | Admitting: *Deleted

## 2013-06-22 NOTE — Progress Notes (Signed)
To come in for [id number]  

## 2013-06-24 ENCOUNTER — Other Ambulatory Visit: Payer: Self-pay | Admitting: Orthopedic Surgery

## 2013-06-24 ENCOUNTER — Encounter (HOSPITAL_BASED_OUTPATIENT_CLINIC_OR_DEPARTMENT_OTHER): Payer: Self-pay | Admitting: *Deleted

## 2013-06-24 ENCOUNTER — Encounter (HOSPITAL_BASED_OUTPATIENT_CLINIC_OR_DEPARTMENT_OTHER)
Admission: RE | Admit: 2013-06-24 | Discharge: 2013-06-24 | Disposition: A | Discharge status: Home / Self Care | Payer: BC Managed Care – PPO | Source: Ambulatory Visit | Attending: Orthopedic Surgery | Admitting: Orthopedic Surgery

## 2013-06-24 LAB — BASIC METABOLIC PANEL
CO2: 27 mEq/L (ref 19–32)
Calcium: 9.2 mg/dL (ref 8.4–10.5)
Chloride: 103 mEq/L (ref 96–112)
Glucose, Bld: 125 mg/dL — ABNORMAL HIGH (ref 70–99)
Potassium: 4.2 mEq/L (ref 3.5–5.1)
Sodium: 138 mEq/L (ref 135–145)

## 2013-06-25 ENCOUNTER — Encounter (HOSPITAL_BASED_OUTPATIENT_CLINIC_OR_DEPARTMENT_OTHER)
Admission: RE | Disposition: A | Discharge status: Home / Self Care | Payer: Self-pay | Source: Ambulatory Visit | Attending: Orthopedic Surgery

## 2013-06-25 ENCOUNTER — Encounter (HOSPITAL_BASED_OUTPATIENT_CLINIC_OR_DEPARTMENT_OTHER): Payer: BC Managed Care – PPO | Admitting: Certified Registered Nurse Anesthetist

## 2013-06-25 ENCOUNTER — Ambulatory Visit (HOSPITAL_BASED_OUTPATIENT_CLINIC_OR_DEPARTMENT_OTHER)
Admission: RE | Admit: 2013-06-25 | Discharge: 2013-06-25 | Disposition: A | Discharge status: Home / Self Care | Payer: BC Managed Care – PPO | Source: Ambulatory Visit | Attending: Orthopedic Surgery | Admitting: Orthopedic Surgery

## 2013-06-25 ENCOUNTER — Encounter (HOSPITAL_BASED_OUTPATIENT_CLINIC_OR_DEPARTMENT_OTHER): Payer: Self-pay | Admitting: Certified Registered Nurse Anesthetist

## 2013-06-25 ENCOUNTER — Ambulatory Visit (HOSPITAL_BASED_OUTPATIENT_CLINIC_OR_DEPARTMENT_OTHER): Payer: BC Managed Care – PPO | Admitting: Certified Registered Nurse Anesthetist

## 2013-06-25 DIAGNOSIS — K219 Gastro-esophageal reflux disease without esophagitis: Secondary | ICD-10-CM | POA: Insufficient documentation

## 2013-06-25 DIAGNOSIS — I10 Essential (primary) hypertension: Secondary | ICD-10-CM | POA: Insufficient documentation

## 2013-06-25 DIAGNOSIS — G43909 Migraine, unspecified, not intractable, without status migrainosus: Secondary | ICD-10-CM | POA: Insufficient documentation

## 2013-06-25 DIAGNOSIS — L719 Rosacea, unspecified: Secondary | ICD-10-CM | POA: Insufficient documentation

## 2013-06-25 DIAGNOSIS — R2241 Localized swelling, mass and lump, right lower limb: Secondary | ICD-10-CM

## 2013-06-25 DIAGNOSIS — M6740 Ganglion, unspecified site: Secondary | ICD-10-CM | POA: Insufficient documentation

## 2013-06-25 DIAGNOSIS — Z79899 Other long term (current) drug therapy: Secondary | ICD-10-CM | POA: Insufficient documentation

## 2013-06-25 DIAGNOSIS — M1990 Unspecified osteoarthritis, unspecified site: Secondary | ICD-10-CM | POA: Insufficient documentation

## 2013-06-25 DIAGNOSIS — Z01812 Encounter for preprocedural laboratory examination: Secondary | ICD-10-CM | POA: Insufficient documentation

## 2013-06-25 DIAGNOSIS — E049 Nontoxic goiter, unspecified: Secondary | ICD-10-CM | POA: Insufficient documentation

## 2013-06-25 DIAGNOSIS — K5730 Diverticulosis of large intestine without perforation or abscess without bleeding: Secondary | ICD-10-CM | POA: Insufficient documentation

## 2013-06-25 DIAGNOSIS — Z01810 Encounter for preprocedural cardiovascular examination: Secondary | ICD-10-CM | POA: Insufficient documentation

## 2013-06-25 DIAGNOSIS — J309 Allergic rhinitis, unspecified: Secondary | ICD-10-CM | POA: Insufficient documentation

## 2013-06-25 DIAGNOSIS — E039 Hypothyroidism, unspecified: Secondary | ICD-10-CM | POA: Insufficient documentation

## 2013-06-25 DIAGNOSIS — E669 Obesity, unspecified: Secondary | ICD-10-CM | POA: Insufficient documentation

## 2013-06-25 HISTORY — PX: GANGLION CYST EXCISION: SHX1691

## 2013-06-25 LAB — POCT HEMOGLOBIN-HEMACUE: Hemoglobin: 14.4 g/dL (ref 12.0–15.0)

## 2013-06-25 SURGERY — EXCISION, GANGLION CYST, FOOT
Anesthesia: General | Site: Ankle | Laterality: Right | Wound class: Clean

## 2013-06-25 MED ORDER — NORCO 5-325 MG PO TABS: 1.0000 | ORAL_TABLET | Freq: Four times a day (QID) | ORAL | Status: DC | PRN

## 2013-06-25 MED ORDER — OXYCODONE HCL 5 MG PO TABS: 5.0000 mg | ORAL_TABLET | Freq: Once | ORAL | Status: DC | PRN

## 2013-06-25 MED ORDER — SODIUM CHLORIDE 0.9 % IV SOLN: INTRAVENOUS | Status: DC

## 2013-06-25 MED ORDER — MIDAZOLAM HCL 2 MG/2ML IJ SOLN: 0.5000 mg | Freq: Once | INTRAMUSCULAR | Status: DC | PRN

## 2013-06-25 MED ORDER — PROMETHAZINE HCL 25 MG/ML IJ SOLN
6.2500 mg | INTRAMUSCULAR | Status: DC | PRN
  Administered 2013-06-25: 6.25 mg via INTRAVENOUS

## 2013-06-25 MED ORDER — LACTATED RINGERS IV SOLN
INTRAVENOUS | Status: DC
  Administered 2013-06-25: 08:00:00 via INTRAVENOUS

## 2013-06-25 MED ORDER — CHLORHEXIDINE GLUCONATE 4 % EX LIQD: 60.0000 mL | Freq: Once | CUTANEOUS | Status: DC

## 2013-06-25 MED ORDER — MEPERIDINE HCL 25 MG/ML IJ SOLN: 6.2500 mg | INTRAMUSCULAR | Status: DC | PRN

## 2013-06-25 MED ORDER — FENTANYL CITRATE 0.05 MG/ML IJ SOLN
INTRAMUSCULAR | Status: AC
  Filled 2013-06-25: qty 4

## 2013-06-25 MED ORDER — MIDAZOLAM HCL 2 MG/2ML IJ SOLN
INTRAMUSCULAR | Status: AC
  Filled 2013-06-25: qty 2

## 2013-06-25 MED ORDER — LIDOCAINE HCL (CARDIAC) 20 MG/ML IV SOLN
INTRAVENOUS | Status: DC | PRN
  Administered 2013-06-25: 50 mg via INTRAVENOUS

## 2013-06-25 MED ORDER — FENTANYL CITRATE 0.05 MG/ML IJ SOLN
INTRAMUSCULAR | Status: AC
  Filled 2013-06-25: qty 2

## 2013-06-25 MED ORDER — OXYCODONE HCL 5 MG/5ML PO SOLN: 5.0000 mg | Freq: Once | ORAL | Status: DC | PRN

## 2013-06-25 MED ORDER — BUPIVACAINE-EPINEPHRINE PF 0.5-1:200000 % IJ SOLN
INTRAMUSCULAR | Status: AC
  Filled 2013-06-25: qty 30

## 2013-06-25 MED ORDER — BACITRACIN ZINC 500 UNIT/GM EX OINT
TOPICAL_OINTMENT | CUTANEOUS | Status: AC
  Filled 2013-06-25: qty 28.35

## 2013-06-25 MED ORDER — BUPIVACAINE-EPINEPHRINE 0.5% -1:200000 IJ SOLN
INTRAMUSCULAR | Status: DC | PRN
  Administered 2013-06-25: 10 mL

## 2013-06-25 MED ORDER — FENTANYL CITRATE 0.05 MG/ML IJ SOLN
25.0000 ug | INTRAMUSCULAR | Status: DC | PRN
  Administered 2013-06-25: 25 ug via INTRAVENOUS

## 2013-06-25 MED ORDER — MIDAZOLAM HCL 5 MG/5ML IJ SOLN
INTRAMUSCULAR | Status: DC | PRN
  Administered 2013-06-25: 2 mg via INTRAVENOUS

## 2013-06-25 MED ORDER — VANCOMYCIN HCL IN DEXTROSE 1-5 GM/200ML-% IV SOLN
1000.0000 mg | INTRAVENOUS | Status: AC
  Administered 2013-06-25: 1500 mg via INTRAVENOUS

## 2013-06-25 MED ORDER — BACITRACIN ZINC 500 UNIT/GM EX OINT
TOPICAL_OINTMENT | CUTANEOUS | Status: DC | PRN
  Administered 2013-06-25: 1 via TOPICAL

## 2013-06-25 MED ORDER — VANCOMYCIN HCL IN DEXTROSE 1-5 GM/200ML-% IV SOLN
INTRAVENOUS | Status: AC
  Filled 2013-06-25: qty 200

## 2013-06-25 MED ORDER — PROPOFOL 10 MG/ML IV BOLUS
INTRAVENOUS | Status: DC | PRN
  Administered 2013-06-25: 130 mg via INTRAVENOUS

## 2013-06-25 MED ORDER — FENTANYL CITRATE 0.05 MG/ML IJ SOLN
INTRAMUSCULAR | Status: DC | PRN
  Administered 2013-06-25 (×2): 50 ug via INTRAVENOUS

## 2013-06-25 MED ORDER — PROMETHAZINE HCL 25 MG/ML IJ SOLN
INTRAMUSCULAR | Status: AC
  Filled 2013-06-25: qty 1

## 2013-06-25 SURGICAL SUPPLY — 74 items
BANDAGE CONFORM 3  STR LF (GAUZE/BANDAGES/DRESSINGS) IMPLANT
BANDAGE ESMARK 6X9 LF (GAUZE/BANDAGES/DRESSINGS) IMPLANT
BLADE MINI RND TIP GREEN BEAV (BLADE) IMPLANT
BLADE SURG 15 STRL LF DISP TIS (BLADE) ×1 IMPLANT
BLADE SURG 15 STRL SS (BLADE) ×2
BNDG CMPR 9X4 STRL LF SNTH (GAUZE/BANDAGES/DRESSINGS) ×1
BNDG CMPR 9X6 STRL LF SNTH (GAUZE/BANDAGES/DRESSINGS)
BNDG COHESIVE 4X5 TAN STRL (GAUZE/BANDAGES/DRESSINGS) IMPLANT
BNDG COHESIVE 6X5 TAN STRL LF (GAUZE/BANDAGES/DRESSINGS) IMPLANT
BNDG ESMARK 4X9 LF (GAUZE/BANDAGES/DRESSINGS) ×1 IMPLANT
BNDG ESMARK 6X9 LF (GAUZE/BANDAGES/DRESSINGS)
CHLORAPREP W/TINT 26ML (MISCELLANEOUS) ×2 IMPLANT
CLOTH BEACON ORANGE TIMEOUT ST (SAFETY) ×2 IMPLANT
COVER TABLE BACK 60X90 (DRAPES) ×2 IMPLANT
CUFF TOURNIQUET SINGLE 34IN LL (TOURNIQUET CUFF) IMPLANT
DRAIN TLS ROUND 10FR (DRAIN) ×1 IMPLANT
DRAPE EXTREMITY T 121X128X90 (DRAPE) ×2 IMPLANT
DRAPE OEC MINIVIEW 54X84 (DRAPES) IMPLANT
DRAPE SURG 17X23 STRL (DRAPES) IMPLANT
DRAPE U-SHAPE 47X51 STRL (DRAPES) ×1 IMPLANT
DRSG EMULSION OIL 3X3 NADH (GAUZE/BANDAGES/DRESSINGS) ×2 IMPLANT
DRSG PAD ABDOMINAL 8X10 ST (GAUZE/BANDAGES/DRESSINGS) ×2 IMPLANT
DRSG TEGADERM 4X4.75 (GAUZE/BANDAGES/DRESSINGS) IMPLANT
ELECT REM PT RETURN 9FT ADLT (ELECTROSURGICAL) ×2
ELECTRODE REM PT RTRN 9FT ADLT (ELECTROSURGICAL) ×1 IMPLANT
GAUZE SPONGE 4X4 16PLY XRAY LF (GAUZE/BANDAGES/DRESSINGS) IMPLANT
GLOVE BIO SURGEON STRL SZ8 (GLOVE) ×2 IMPLANT
GLOVE BIOGEL PI IND STRL 7.0 (GLOVE) IMPLANT
GLOVE BIOGEL PI IND STRL 7.5 (GLOVE) ×1 IMPLANT
GLOVE BIOGEL PI IND STRL 8 (GLOVE) ×1 IMPLANT
GLOVE BIOGEL PI INDICATOR 7.0 (GLOVE) ×1
GLOVE BIOGEL PI INDICATOR 7.5 (GLOVE) ×1
GLOVE BIOGEL PI INDICATOR 8 (GLOVE) ×1
GLOVE ECLIPSE 6.5 STRL STRAW (GLOVE) ×1 IMPLANT
GLOVE ECLIPSE 7.0 STRL STRAW (GLOVE) ×2 IMPLANT
GLOVE EXAM NITRILE MD LF STRL (GLOVE) IMPLANT
GOWN PREVENTION PLUS XLARGE (GOWN DISPOSABLE) ×4 IMPLANT
GOWN PREVENTION PLUS XXLARGE (GOWN DISPOSABLE) ×2 IMPLANT
NDL HYPO 25X1 1.5 SAFETY (NEEDLE) IMPLANT
NEEDLE HYPO 22GX1.5 SAFETY (NEEDLE) IMPLANT
NEEDLE HYPO 25X1 1.5 SAFETY (NEEDLE) ×2 IMPLANT
NS IRRIG 1000ML POUR BTL (IV SOLUTION) ×2 IMPLANT
PACK BASIN DAY SURGERY FS (CUSTOM PROCEDURE TRAY) ×2 IMPLANT
PAD CAST 4YDX4 CTTN HI CHSV (CAST SUPPLIES) ×1 IMPLANT
PADDING CAST ABS 4INX4YD NS (CAST SUPPLIES)
PADDING CAST ABS COTTON 4X4 ST (CAST SUPPLIES) IMPLANT
PADDING CAST COTTON 4X4 STRL (CAST SUPPLIES) ×2
PADDING CAST COTTON 6X4 STRL (CAST SUPPLIES) IMPLANT
PENCIL BUTTON HOLSTER BLD 10FT (ELECTRODE) ×1 IMPLANT
SANITIZER HAND PURELL 535ML FO (MISCELLANEOUS) ×2 IMPLANT
SHEET MEDIUM DRAPE 40X70 STRL (DRAPES) ×2 IMPLANT
SLEEVE SCD COMPRESS KNEE MED (MISCELLANEOUS) ×1 IMPLANT
SPONGE GAUZE 4X4 12PLY (GAUZE/BANDAGES/DRESSINGS) ×4 IMPLANT
SPONGE LAP 18X18 X RAY DECT (DISPOSABLE) ×2 IMPLANT
STOCKINETTE 6  STRL (DRAPES) ×1
STOCKINETTE 6 STRL (DRAPES) ×1 IMPLANT
STRIP CLOSURE SKIN 1/2X4 (GAUZE/BANDAGES/DRESSINGS) IMPLANT
SUCTION FRAZIER TIP 10 FR DISP (SUCTIONS) IMPLANT
SUT ETHILON 3 0 PS 1 (SUTURE) IMPLANT
SUT ETHILON 4 0 PS 2 18 (SUTURE) IMPLANT
SUT MNCRL AB 3-0 PS2 18 (SUTURE) ×1 IMPLANT
SUT MNCRL AB 4-0 PS2 18 (SUTURE) IMPLANT
SUT VIC AB 0 SH 27 (SUTURE) IMPLANT
SUT VIC AB 2-0 PS2 27 (SUTURE) IMPLANT
SUT VIC AB 3-0 PS1 18 (SUTURE)
SUT VIC AB 3-0 PS1 18XBRD (SUTURE) IMPLANT
SUT VICRYL 4-0 PS2 18IN ABS (SUTURE) IMPLANT
SYR BULB 3OZ (MISCELLANEOUS) ×2 IMPLANT
SYR CONTROL 10ML LL (SYRINGE) ×1 IMPLANT
TOWEL OR 17X24 6PK STRL BLUE (TOWEL DISPOSABLE) ×2 IMPLANT
TOWEL OR NON WOVEN STRL DISP B (DISPOSABLE) ×1 IMPLANT
TUBE CONNECTING 20X1/4 (TUBING) IMPLANT
UNDERPAD 30X30 INCONTINENT (UNDERPADS AND DIAPERS) ×2 IMPLANT
YANKAUER SUCT BULB TIP NO VENT (SUCTIONS) IMPLANT

## 2013-06-25 NOTE — Op Note (Signed)
[name], RATCLIFFE NO.:  [id number]  MEDICAL RECORD NO.:  [id number]  LOCATION:                               FACILITY:  [facility]  PHYSICIAN:  Toni Arthurs, MD        DATE OF BIRTH:  16-Nov-1955  DATE OF PROCEDURE:  06/25/2013 DATE OF DISCHARGE:  06/25/2013                              OPERATIVE REPORT   PREOPERATIVE DIAGNOSIS:  Right medial ankle ganglion cyst.  POSTOPERATIVE DIAGNOSIS:  Right medial ankle mass.  PROCEDURE:  Excision of right ankle mass, 2 cm x 2 cm x 2 cm.  SURGEON:  Toni Arthurs, MD  ASSISTANT:  Lorin Picket Flowers, PA-C  ANESTHESIA:  General.  ESTIMATED BLOOD LOSS:  Minimal.  TOURNIQUET TIME:  14 minutes with an ankle Esmarch.  COMPLICATIONS:  None apparent.  DISPOSITION:  Extubated, awake, and stable to recovery.  INDICATIONS FOR PROCEDURE:  The patient is a 57 year old woman without significant past medical history who complains of a mass at the medial aspect of her ankle.  She noticed this many months ago, but feels like it has slowly gotten larger.  Clinically, this appears to be a ganglion cyst.  We talked about treatment options and she desires surgical excision.  She specifically understands risks of bleeding, infection, nerve damage, blood clots, need for additional surgery, amputation, and death.  PROCEDURE IN DETAIL:  After preoperative consent was obtained, the correct operative site was identified.  The patient was brought to the operating room and placed supine on the operating table.  General anesthesia was induced.  Preoperative antibiotics were administered. Surgical time-out was taken.  The right lower extremity was prepped and draped in standard sterile fashion.  The foot was exsanguinated and a 4- inch Esmarch tourniquet was wrapped around the distal leg.  The mass was palpated at the anteromedial ankle joint line.  The longitudinal incision was made over this area.  Sharp dissection was carried down through the skin  and subcutaneous tissue.  Blunt dissection was then carried down to the superficial level of the mass.  The mass was then dissected free circumferentially from the surrounding soft tissues.  It was noted to be well encapsulated.  It had the appearance of a solid mass.  It was densely adherent to the underlying spring ligament adjacent to the posterior tibial tendon.  Posterior tibial tendon was inspected and showed no evidence of involvement with the mass and no evidence of any significant degenerative change or tear.  The mass was then elevated off of the ligament.  It was passed off the field as a specimen to Pathology measuring approximately 2 cm x 2 cm x 2 cm.  It was solid and appeared to be of fibrous tissue.  The tourniquet was released.  Hemostasis was achieved.  The wound was irrigated copiously. A TLS drain was placed in the depth of the wound.  The subcutaneous tissue was approximated with inverted simple sutures of 3-0 Monocryl and a running 3-0 nylon was used to close the skin incision.  Sterile dressings were applied followed by compression wrap.  The patient was awakened from anesthesia and transported to the recovery room in stable condition.  FOLLOWUP PLAN:  The patient will be weightbearing as tolerated on her right foot.  She will follow up with me in 2 weeks for suture removal and results of her pathology.  We will remove the drain in the recovery room prior to discharge home. Scott Flowers PA-C was present scrubbed for the duration of the case. His assistance was critical to gaining and maintaining exposure, performing surgery and closing the wounds as well as applying dressings.     Toni Arthurs, MD   ______________________________ Toni Arthurs, MD    JH/MEDQ  D:  06/25/2013  T:  06/25/2013  Job:  [id number]

## 2013-06-25 NOTE — Brief Op Note (Signed)
06/25/2013  9:43 AM  PATIENT:  Thad Ranger  57 y.o. female  PRE-OPERATIVE DIAGNOSIS:  RIGHT MEDIAL ANKLE GANGLION CYST  POST-OPERATIVE DIAGNOSIS:  RIGHT MEDIAL ANKLE mass  Procedure(s): Excision of right ankle mass 2 cm x 2 cm x 2 cm  SURGEON:  Toni Arthurs, MD  ASSISTANT: Scott FLowers, PA-C  ANESTHESIA:   General  EBL:  minimal   TOURNIQUET:   Total Tourniquet Time Documented: Leg (Right) - 14 minutes Total: Leg (Right) - 14 minutes   COMPLICATIONS:  None apparent  DISPOSITION:  Extubated, awake and stable to recovery.  DICTATION ID:  [id number]

## 2013-06-25 NOTE — Anesthesia Procedure Notes (Signed)
Procedure Name: LMA Insertion Performed by: HEWITT, JOHN Pre-anesthesia Checklist: Patient identified, Emergency Drugs available, Suction available and Patient being monitored Patient Re-evaluated:Patient Re-evaluated prior to inductionOxygen Delivery Method: Circle System Utilized Preoxygenation: Pre-oxygenation with 100% oxygen Intubation Type: IV induction Ventilation: Mask ventilation without difficulty LMA: LMA inserted LMA Size: 4.0 Number of attempts: 1 Airway Equipment and Method: bite block Placement Confirmation: positive ETCO2 Tube secured with: Tape Dental Injury: Teeth and Oropharynx as per pre-operative assessment      

## 2013-06-25 NOTE — H&P (Signed)
[name] is an 57 y.o. female.   Chief Complaint: right ankle pain HPI:  57 y/o female with painful right ankle ganglion cyst.  She presents now for surgical excision.  She denies any changes in her health since I saw her last.  Past Medical History  Diagnosis Date  . Diverticulosis   . Multiple thyroid nodules   . Hypothyroidism   . Migraines   . Allergic rhinitis   . Acne rosacea   . Obesity   . Hypertension   . Goiter   . Hip bursitis   . Tubular adenoma of colon 04/2011  . GERD (gastroesophageal reflux disease)   . Overactive bladder     carpal tunnel   . Colitis   . Wears glasses   . Shortness of breath     with exertion   . Anemia   . Osteoarthritis     Past Surgical History  Procedure Laterality Date  . Total abdominal hysterectomy w/ bilateral salpingoophorectomy  2003  . Nasal septum surgery  1991  . Cautery of turbinates  1990's  . Partial knee replacement  04/2009    Left  . Knee arthroscopy  11/2010    Right  . Tubal ligation    . Thyroidectomy  05/08/2012    Procedure: THYROIDECTOMY;  Surgeon: Velora Heckler, MD;  Location: [facility];  Service: General;  Laterality: N/A;  Total Thyriodectomy  . Colonoscopy      Family History  Problem Relation Age of Onset  . Colon cancer Maternal Grandfather   . Diabetes Mother   . Heart disease Mother   . Breast cancer Mother   . Pulmonary embolism Father   . Hypertension Sister    Social History:  reports that she has never smoked. She has never used smokeless tobacco. She reports that she does not drink alcohol or use illicit drugs.  Allergies:  Allergies  Allergen Reactions  . Cephalexin Hives  . Zithromax [Azithromycin] Other (See Comments)    Gi upset     Medications Prior to Admission  Medication Sig Dispense Refill  . AMBULATORY NON FORMULARY MEDICATION Medication Name: CoHosh 541 mg. Take 1 capsule once daily      . Cholecalciferol (VITAMIN D) 400 UNITS capsule Take 400 Units by mouth daily.       . fluticasone (FLONASE) 50 MCG/ACT nasal spray Place 2 sprays into the nose daily.       . hyoscyamine (LEVSIN SL) 0.125 MG SL tablet Take 1-2 tablets by mouth before meals as needed  90 tablet  11  . lansoprazole (PREVACID) 15 MG capsule Take 15 mg by mouth daily.      Marland Kitchen levothyroxine (SYNTHROID, LEVOTHROID) 150 MCG tablet Take 150 mcg by mouth daily.      Marland Kitchen losartan (COZAAR) 50 MG tablet Take 50 mg by mouth every morning.       . metroNIDAZOLE (METROCREAM) 0.75 % cream Apply 1 application topically daily.       . Naproxen Sodium (ALEVE) 220 MG CAPS Take 440 mg by mouth every 8 (eight) hours as needed. pain      . Probiotic Product (RESTORA) CAPS Take 1 capsule by mouth daily.  30 capsule  11  . tolterodine (DETROL LA) 4 MG 24 hr capsule Take 2 mg by mouth every morning.       . zolpidem (AMBIEN) 10 MG tablet Take 0.5 tablets (5 mg total) by mouth at bedtime as needed. For sleep.  30 tablet  0  .  calcium carbonate (OS-CAL - DOSED IN MG OF ELEMENTAL CALCIUM) 1250 MG tablet Take 2 tablets (1,000 mg of elemental calcium total) by mouth 2 (two) times daily.  60 tablet  1    Results for orders placed during the hospital encounter of July 16, 2013 (from the past 48 hour(s))  BASIC METABOLIC PANEL     Status: Abnormal   Collection Time    06/24/13  2:30 PM      Result Value Range   Sodium 138  135 - 145 mEq/L   Potassium 4.2  3.5 - 5.1 mEq/L   Chloride 103  96 - 112 mEq/L   CO2 27  19 - 32 mEq/L   Glucose, Bld 125 (*) 70 - 99 mg/dL   BUN 17  6 - 23 mg/dL   Creatinine, Ser 1.61  0.50 - 1.10 mg/dL   Calcium 9.2  8.4 - 09.6 mg/dL   GFR calc non Af Amer >90  >90 mL/min   GFR calc Af Amer >90  >90 mL/min   Comment: (NOTE)     The eGFR has been calculated using the CKD EPI equation.     This calculation has not been validated in all clinical situations.     eGFR's persistently <90 mL/min signify possible Chronic Kidney     Disease.   No results found.  ROS  No recent f/c/n/v/wt loss.  Blood  pressure 128/59, pulse 72, temperature 97.8 F (36.6 C), temperature source Oral, resp. rate 18, height 5\' 7"  (1.702 m), weight 92.761 kg (204 lb 8 oz), SpO2 97.00%. Physical Exam  wn wd woman in nad.  A and O x 4.  Mood and affect normal.  EOMI.  resp unlabored.  R ankle with swelling medially that is firm and nontender.  Normal healthy skin.  2+ dp and pt pulses.  Sens to LT intact about the foot and ankle.  5/5 strength in PF and DF of the ankle and toes.  Assessment/Plan Right ankle ganglion cyst.  To OR for excision.  The risks and benefits of the alternative treatment options have been discussed in detail.  The patient wishes to proceed with surgery and specifically understands risks of bleeding, infection, nerve damage, blood clots, need for additional surgery, amputation and death.   Toni Arthurs 07/16/2013, 8:26 AM

## 2013-06-25 NOTE — Anesthesia Postprocedure Evaluation (Signed)
  Anesthesia Post-op Note  Patient: [name]  Procedure(s) Performed: Procedure(s): RIGHT ANKLE GANGLION CYST EXCISION (Right)  Patient Location: PACU  Anesthesia Type:General  Level of Consciousness: awake, alert , oriented and patient cooperative  Airway and Oxygen Therapy: Patient Spontanous Breathing  Post-op Pain: none  Post-op Assessment: Post-op Vital signs reviewed, Patient's Cardiovascular Status Stable, Respiratory Function Stable, Patent Airway and Pain level controlled. Nausea improved  Post-op Vital Signs: Reviewed and stable  Complications: No apparent anesthesia complications

## 2013-06-25 NOTE — Anesthesia Preprocedure Evaluation (Addendum)
Anesthesia Evaluation  Patient identified by MRN, date of birth, ID band Patient awake    Reviewed: Allergy & Precautions, H&P , NPO status , Patient's Chart, lab work & pertinent test results, reviewed documented beta blocker date and time   History of Anesthesia Complications Negative for: history of anesthetic complications  Airway Mallampati: II TM Distance: >3 FB Neck ROM: Full    Dental  (+) Chipped and Dental Advisory Given   Pulmonary  breath sounds clear to auscultation  Pulmonary exam normal       Cardiovascular hypertension, Pt. on medications Rhythm:Regular Rate:Normal     Neuro/Psych  Headaches, negative neurological ROS     GI/Hepatic Neg liver ROS, GERD-  Medicated and Controlled,  Endo/Other  Hypothyroidism Morbid obesity  Renal/GU negative Renal ROS     Musculoskeletal   Abdominal (+) + obese,   Peds  Hematology negative hematology ROS (+)   Anesthesia Other Findings   Reproductive/Obstetrics                          Anesthesia Physical Anesthesia Plan  ASA: II  Anesthesia Plan: General   Post-op Pain Management:    Induction: Intravenous  Airway Management Planned: LMA  Additional Equipment:   Intra-op Plan:   Post-operative Plan:   Informed Consent: I have reviewed the patients History and Physical, chart, labs and discussed the procedure including the risks, benefits and alternatives for the proposed anesthesia with the patient or authorized representative who has indicated his/her understanding and acceptance.   Dental advisory given  Plan Discussed with: CRNA and Surgeon  Anesthesia Plan Comments: (Plan routine monitors, GA- LMA OK)        Anesthesia Quick Evaluation

## 2013-06-25 NOTE — Transfer of Care (Signed)
Immediate Anesthesia Transfer of Care Note  Patient: [name]  Procedure(s) Performed: Procedure(s): RIGHT ANKLE GANGLION CYST EXCISION (Right)  Patient Location: PACU  Anesthesia Type:General  Level of Consciousness: awake, alert , oriented and patient cooperative  Airway & Oxygen Therapy: Patient Spontanous Breathing and Patient connected to face mask oxygen  Post-op Assessment: Report given to PACU RN and Post -op Vital signs reviewed and stable  Post vital signs: Reviewed and stable  Complications: No apparent anesthesia complications

## 2013-06-29 ENCOUNTER — Encounter (HOSPITAL_BASED_OUTPATIENT_CLINIC_OR_DEPARTMENT_OTHER): Payer: Self-pay | Admitting: Orthopedic Surgery

## 2013-07-22 ENCOUNTER — Encounter (HOSPITAL_BASED_OUTPATIENT_CLINIC_OR_DEPARTMENT_OTHER): Payer: Self-pay | Admitting: *Deleted

## 2013-07-22 ENCOUNTER — Other Ambulatory Visit: Payer: Self-pay | Admitting: Orthopedic Surgery

## 2013-07-22 NOTE — Progress Notes (Signed)
Pt here 06/24/13 for rt ankle surgery-infected-was opened and packed yesterday

## 2013-07-23 ENCOUNTER — Encounter (HOSPITAL_BASED_OUTPATIENT_CLINIC_OR_DEPARTMENT_OTHER): Payer: Self-pay | Admitting: *Deleted

## 2013-07-23 ENCOUNTER — Encounter (HOSPITAL_BASED_OUTPATIENT_CLINIC_OR_DEPARTMENT_OTHER)
Admission: RE | Disposition: A | Discharge status: Home / Self Care | Payer: Self-pay | Source: Ambulatory Visit | Attending: Orthopedic Surgery

## 2013-07-23 ENCOUNTER — Ambulatory Visit (HOSPITAL_BASED_OUTPATIENT_CLINIC_OR_DEPARTMENT_OTHER): Payer: BC Managed Care – PPO | Admitting: Certified Registered Nurse Anesthetist

## 2013-07-23 ENCOUNTER — Encounter (HOSPITAL_BASED_OUTPATIENT_CLINIC_OR_DEPARTMENT_OTHER): Payer: BC Managed Care – PPO | Admitting: Certified Registered Nurse Anesthetist

## 2013-07-23 ENCOUNTER — Ambulatory Visit (HOSPITAL_BASED_OUTPATIENT_CLINIC_OR_DEPARTMENT_OTHER)
Admission: RE | Admit: 2013-07-23 | Discharge: 2013-07-23 | Disposition: A | Discharge status: Home / Self Care | Payer: BC Managed Care – PPO | Source: Ambulatory Visit | Attending: Orthopedic Surgery | Admitting: Orthopedic Surgery

## 2013-07-23 DIAGNOSIS — K5730 Diverticulosis of large intestine without perforation or abscess without bleeding: Secondary | ICD-10-CM | POA: Insufficient documentation

## 2013-07-23 DIAGNOSIS — K219 Gastro-esophageal reflux disease without esophagitis: Secondary | ICD-10-CM | POA: Insufficient documentation

## 2013-07-23 DIAGNOSIS — L02419 Cutaneous abscess of limb, unspecified: Secondary | ICD-10-CM | POA: Insufficient documentation

## 2013-07-23 DIAGNOSIS — T8130XA Disruption of wound, unspecified, initial encounter: Secondary | ICD-10-CM | POA: Insufficient documentation

## 2013-07-23 DIAGNOSIS — I10 Essential (primary) hypertension: Secondary | ICD-10-CM | POA: Insufficient documentation

## 2013-07-23 DIAGNOSIS — Y838 Other surgical procedures as the cause of abnormal reaction of the patient, or of later complication, without mention of misadventure at the time of the procedure: Secondary | ICD-10-CM | POA: Insufficient documentation

## 2013-07-23 DIAGNOSIS — E669 Obesity, unspecified: Secondary | ICD-10-CM | POA: Insufficient documentation

## 2013-07-23 DIAGNOSIS — E039 Hypothyroidism, unspecified: Secondary | ICD-10-CM | POA: Insufficient documentation

## 2013-07-23 DIAGNOSIS — E042 Nontoxic multinodular goiter: Secondary | ICD-10-CM | POA: Insufficient documentation

## 2013-07-23 HISTORY — PX: INCISION AND DRAINAGE OF WOUND: SHX1803

## 2013-07-23 SURGERY — IRRIGATION AND DEBRIDEMENT WOUND
Anesthesia: General | Site: Ankle | Laterality: Right | Wound class: Dirty or Infected

## 2013-07-23 MED ORDER — PROPOFOL 10 MG/ML IV BOLUS
INTRAVENOUS | Status: DC | PRN
  Administered 2013-07-23: 180 mg via INTRAVENOUS

## 2013-07-23 MED ORDER — SODIUM CHLORIDE 0.9 % IR SOLN
Status: DC | PRN
  Administered 2013-07-23: 2000 mL

## 2013-07-23 MED ORDER — FENTANYL CITRATE 0.05 MG/ML IJ SOLN: 50.0000 ug | INTRAMUSCULAR | Status: DC | PRN

## 2013-07-23 MED ORDER — BACITRACIN ZINC 500 UNIT/GM EX OINT
TOPICAL_OINTMENT | CUTANEOUS | Status: AC
  Filled 2013-07-23: qty 28.35

## 2013-07-23 MED ORDER — CHLORHEXIDINE GLUCONATE 4 % EX LIQD: 60.0000 mL | Freq: Once | CUTANEOUS | Status: DC

## 2013-07-23 MED ORDER — OXYCODONE HCL 5 MG/5ML PO SOLN: 5.0000 mg | Freq: Once | ORAL | Status: DC | PRN

## 2013-07-23 MED ORDER — MIDAZOLAM HCL 2 MG/2ML IJ SOLN
1.0000 mg | INTRAMUSCULAR | Status: DC | PRN
Start: 2013-07-23 — End: 2013-07-23

## 2013-07-23 MED ORDER — MIDAZOLAM HCL 2 MG/2ML IJ SOLN
INTRAMUSCULAR | Status: AC
  Filled 2013-07-23: qty 2

## 2013-07-23 MED ORDER — BUPIVACAINE HCL (PF) 0.5 % IJ SOLN
INTRAMUSCULAR | Status: AC
  Filled 2013-07-23: qty 30

## 2013-07-23 MED ORDER — CEFAZOLIN SODIUM-DEXTROSE 2-3 GM-% IV SOLR
INTRAVENOUS | Status: AC
  Filled 2013-07-23: qty 50

## 2013-07-23 MED ORDER — VANCOMYCIN HCL IN DEXTROSE 1-5 GM/200ML-% IV SOLN
INTRAVENOUS | Status: AC
  Filled 2013-07-23: qty 200

## 2013-07-23 MED ORDER — BUPIVACAINE-EPINEPHRINE PF 0.5-1:200000 % IJ SOLN
INTRAMUSCULAR | Status: DC | PRN
  Administered 2013-07-23: 10 mL

## 2013-07-23 MED ORDER — HYDROMORPHONE HCL PF 1 MG/ML IJ SOLN
0.2500 mg | INTRAMUSCULAR | Status: DC | PRN
Start: 2013-07-23 — End: 2013-07-23

## 2013-07-23 MED ORDER — MIDAZOLAM HCL 5 MG/5ML IJ SOLN
INTRAMUSCULAR | Status: DC | PRN
  Administered 2013-07-23: 1 mg via INTRAVENOUS

## 2013-07-23 MED ORDER — BUPIVACAINE-EPINEPHRINE PF 0.5-1:200000 % IJ SOLN
INTRAMUSCULAR | Status: AC
  Filled 2013-07-23: qty 30

## 2013-07-23 MED ORDER — PROMETHAZINE HCL 25 MG/ML IJ SOLN
6.2500 mg | INTRAMUSCULAR | Status: DC | PRN
  Administered 2013-07-23: 6.25 mg via INTRAVENOUS

## 2013-07-23 MED ORDER — 0.9 % SODIUM CHLORIDE (POUR BTL) OPTIME
TOPICAL | Status: DC | PRN
  Administered 2013-07-23: 1000 mL

## 2013-07-23 MED ORDER — PROPOFOL 10 MG/ML IV BOLUS
INTRAVENOUS | Status: AC
  Filled 2013-07-23: qty 40

## 2013-07-23 MED ORDER — VANCOMYCIN HCL 1000 MG IV SOLR
1000.0000 mg | INTRAVENOUS | Status: DC | PRN
  Administered 2013-07-23: 1000 mg via INTRAVENOUS

## 2013-07-23 MED ORDER — ONDANSETRON HCL 4 MG/2ML IJ SOLN
INTRAMUSCULAR | Status: DC | PRN
  Administered 2013-07-23: 4 mg via INTRAVENOUS

## 2013-07-23 MED ORDER — SODIUM CHLORIDE 0.9 % IV SOLN: INTRAVENOUS | Status: DC

## 2013-07-23 MED ORDER — NORCO 5-325 MG PO TABS: 1.0000 | ORAL_TABLET | Freq: Four times a day (QID) | ORAL | Status: DC | PRN

## 2013-07-23 MED ORDER — HYDROCODONE-ACETAMINOPHEN 5-325 MG PO TABS
1.0000 | ORAL_TABLET | Freq: Once | ORAL | Status: AC
  Administered 2013-07-23: 1 via ORAL

## 2013-07-23 MED ORDER — OXYCODONE HCL 5 MG PO TABS: 5.0000 mg | ORAL_TABLET | Freq: Once | ORAL | Status: DC | PRN

## 2013-07-23 MED ORDER — HYDROCODONE-ACETAMINOPHEN 5-325 MG PO TABS
ORAL_TABLET | ORAL | Status: AC
  Filled 2013-07-23: qty 1

## 2013-07-23 MED ORDER — FENTANYL CITRATE 0.05 MG/ML IJ SOLN
INTRAMUSCULAR | Status: DC | PRN
  Administered 2013-07-23: 100 ug via INTRAVENOUS

## 2013-07-23 MED ORDER — PROMETHAZINE HCL 25 MG/ML IJ SOLN
INTRAMUSCULAR | Status: AC
  Filled 2013-07-23: qty 1

## 2013-07-23 MED ORDER — LACTATED RINGERS IV SOLN
INTRAVENOUS | Status: DC
  Administered 2013-07-23 (×2): via INTRAVENOUS

## 2013-07-23 MED ORDER — BACITRACIN ZINC 500 UNIT/GM EX OINT
TOPICAL_OINTMENT | CUTANEOUS | Status: DC | PRN
  Administered 2013-07-23: 1 via TOPICAL

## 2013-07-23 MED ORDER — FENTANYL CITRATE 0.05 MG/ML IJ SOLN
INTRAMUSCULAR | Status: AC
  Filled 2013-07-23: qty 4

## 2013-07-23 MED ORDER — LIDOCAINE HCL (CARDIAC) 20 MG/ML IV SOLN
INTRAVENOUS | Status: DC | PRN
  Administered 2013-07-23: 100 mg via INTRAVENOUS

## 2013-07-23 SURGICAL SUPPLY — 75 items
BAG DECANTER FOR FLEXI CONT (MISCELLANEOUS) IMPLANT
BANDAGE ESMARK 6X9 LF (GAUZE/BANDAGES/DRESSINGS) IMPLANT
BANDAGE GAUZE ELAST BULKY 4 IN (GAUZE/BANDAGES/DRESSINGS) IMPLANT
BLADE SURG 15 STRL LF DISP TIS (BLADE) ×2 IMPLANT
BLADE SURG 15 STRL SS (BLADE) ×2
BNDG CMPR 9X4 STRL LF SNTH (GAUZE/BANDAGES/DRESSINGS) ×1
BNDG CMPR 9X6 STRL LF SNTH (GAUZE/BANDAGES/DRESSINGS)
BNDG COHESIVE 3X5 TAN STRL LF (GAUZE/BANDAGES/DRESSINGS) IMPLANT
BNDG COHESIVE 4X5 TAN STRL (GAUZE/BANDAGES/DRESSINGS) ×1 IMPLANT
BNDG COHESIVE 6X5 TAN STRL LF (GAUZE/BANDAGES/DRESSINGS) IMPLANT
BNDG ESMARK 4X9 LF (GAUZE/BANDAGES/DRESSINGS) ×1 IMPLANT
BNDG ESMARK 6X9 LF (GAUZE/BANDAGES/DRESSINGS)
CANISTER SUCT 1200ML W/VALVE (MISCELLANEOUS) ×2 IMPLANT
CANISTER SUCT 3000ML (MISCELLANEOUS) IMPLANT
CANISTER SUCT LVC 12 LTR MEDI- (MISCELLANEOUS) ×1 IMPLANT
CHLORAPREP W/TINT 26ML (MISCELLANEOUS) ×2 IMPLANT
COVER TABLE BACK 60X90 (DRAPES) ×2 IMPLANT
CUFF TOURNIQUET SINGLE 18IN (TOURNIQUET CUFF) IMPLANT
CUFF TOURNIQUET SINGLE 34IN LL (TOURNIQUET CUFF) IMPLANT
DECANTER SPIKE VIAL GLASS SM (MISCELLANEOUS) IMPLANT
DRAPE EXTREMITY T 121X128X90 (DRAPE) ×2 IMPLANT
DRAPE INCISE IOBAN 66X45 STRL (DRAPES) IMPLANT
DRAPE SURG 17X23 STRL (DRAPES) ×2 IMPLANT
DRAPE U-SHAPE 47X51 STRL (DRAPES) ×1 IMPLANT
DRSG EMULSION OIL 3X3 NADH (GAUZE/BANDAGES/DRESSINGS) ×1 IMPLANT
DRSG PAD ABDOMINAL 8X10 ST (GAUZE/BANDAGES/DRESSINGS) ×1 IMPLANT
ELECT REM PT RETURN 9FT ADLT (ELECTROSURGICAL) ×2
ELECTRODE REM PT RTRN 9FT ADLT (ELECTROSURGICAL) ×1 IMPLANT
GLOVE BIO SURGEON STRL SZ8 (GLOVE) ×2 IMPLANT
GLOVE BIOGEL PI IND STRL 7.0 (GLOVE) IMPLANT
GLOVE BIOGEL PI IND STRL 7.5 (GLOVE) ×1 IMPLANT
GLOVE BIOGEL PI IND STRL 8 (GLOVE) ×1 IMPLANT
GLOVE BIOGEL PI INDICATOR 7.0 (GLOVE) ×1
GLOVE BIOGEL PI INDICATOR 7.5 (GLOVE) ×1
GLOVE BIOGEL PI INDICATOR 8 (GLOVE) ×1
GLOVE ECLIPSE 6.5 STRL STRAW (GLOVE) ×1 IMPLANT
GLOVE ECLIPSE 7.0 STRL STRAW (GLOVE) ×2 IMPLANT
GLOVE EXAM NITRILE MD LF STRL (GLOVE) ×1 IMPLANT
GOWN PREVENTION PLUS XLARGE (GOWN DISPOSABLE) ×4 IMPLANT
GOWN PREVENTION PLUS XXLARGE (GOWN DISPOSABLE) ×2 IMPLANT
HANDPIECE INTERPULSE COAX TIP (DISPOSABLE)
NDL SAFETY ECLIPSE 18X1.5 (NEEDLE) IMPLANT
NEEDLE HYPO 18GX1.5 SHARP (NEEDLE)
NEEDLE HYPO 22GX1.5 SAFETY (NEEDLE) IMPLANT
PACK BASIN DAY SURGERY FS (CUSTOM PROCEDURE TRAY) ×2 IMPLANT
PAD CAST 4YDX4 CTTN HI CHSV (CAST SUPPLIES) ×2 IMPLANT
PADDING CAST ABS 4INX4YD NS (CAST SUPPLIES)
PADDING CAST ABS COTTON 4X4 ST (CAST SUPPLIES) IMPLANT
PADDING CAST COTTON 4X4 STRL (CAST SUPPLIES) ×4
PENCIL BUTTON HOLSTER BLD 10FT (ELECTRODE) ×2 IMPLANT
SANITIZER HAND PURELL 535ML FO (MISCELLANEOUS) IMPLANT
SET HNDPC FAN SPRY TIP SCT (DISPOSABLE) IMPLANT
SET IRRIG Y TYPE TUR BLADDER L (SET/KITS/TRAYS/PACK) ×1 IMPLANT
SHEET MEDIUM DRAPE 40X70 STRL (DRAPES) ×2 IMPLANT
SPLINT FAST PLASTER 5X30 (CAST SUPPLIES)
SPLINT PLASTER CAST FAST 5X30 (CAST SUPPLIES) IMPLANT
SPONGE LAP 18X18 X RAY DECT (DISPOSABLE) ×2 IMPLANT
STAPLER VISISTAT 35W (STAPLE) IMPLANT
STOCKINETTE 6  STRL (DRAPES) ×1
STOCKINETTE 6 STRL (DRAPES) ×1 IMPLANT
SUCTION FRAZIER TIP 10 FR DISP (SUCTIONS) ×2 IMPLANT
SUT ETHILON 3 0 PS 1 (SUTURE) IMPLANT
SUT VIC AB 2-0 SH 27 (SUTURE)
SUT VIC AB 2-0 SH 27XBRD (SUTURE) IMPLANT
SUT VIC AB 3-0 PS1 18 (SUTURE)
SUT VIC AB 3-0 PS1 18XBRD (SUTURE) IMPLANT
SUT VIC AB 3-0 SH 18 (SUTURE) IMPLANT
SUT VICRYL 4-0 PS2 18IN ABS (SUTURE) IMPLANT
SYR 20CC LL (SYRINGE) IMPLANT
SYR 5ML LL (SYRINGE) IMPLANT
SYR BULB 3OZ (MISCELLANEOUS) ×1 IMPLANT
TOWEL OR 17X24 6PK STRL BLUE (TOWEL DISPOSABLE) ×2 IMPLANT
TOWEL OR NON WOVEN STRL DISP B (DISPOSABLE) ×2 IMPLANT
TUBE CONNECTING 20X1/4 (TUBING) ×3 IMPLANT
UNDERPAD 30X30 INCONTINENT (UNDERPADS AND DIAPERS) ×2 IMPLANT

## 2013-07-23 NOTE — Transfer of Care (Signed)
Immediate Anesthesia Transfer of Care Note  Patient: [name]  Procedure(s) Performed: Procedure(s) with comments: IRRIGATION AND DEBRIDEMENT OF RIGHT ANKLE (Right) - Esmark used for tourniquet--on at 1417, off at 1437.  Patient Location: PACU  Anesthesia Type:General  Level of Consciousness: awake, alert  and oriented  Airway & Oxygen Therapy: Patient Spontanous Breathing and Patient connected to face mask oxygen  Post-op Assessment: Report given to PACU RN, Post -op Vital signs reviewed and stable and Patient moving all extremities  Post vital signs: Reviewed and stable  Complications: No apparent anesthesia complications

## 2013-07-23 NOTE — H&P (Signed)
[name] is an 57 y.o. female.   Chief Complaint:  Right ankle pain HPI:  57 y/o female with h/o right ankle ganglion cyst excision a month ago.  The patient had her stitches removed about two weeks ago but noted that the wound opened up and continued to drain after removal.  She noted swelling and erythema around the incision and came to clinic earlier this week.  She was started on doxycycline and presents now for I and D and possible closure of the wound.    Past Medical History  Diagnosis Date  . Diverticulosis   . Multiple thyroid nodules   . Hypothyroidism   . Migraines   . Allergic rhinitis   . Acne rosacea   . Obesity   . Hypertension   . Goiter   . Hip bursitis   . Tubular adenoma of colon 04/2011  . GERD (gastroesophageal reflux disease)   . Overactive bladder     carpal tunnel   . Colitis   . Wears glasses   . Shortness of breath     with exertion   . Anemia   . Osteoarthritis     Past Surgical History  Procedure Laterality Date  . Total abdominal hysterectomy w/ bilateral salpingoophorectomy  2003  . Nasal septum surgery  1991  . Cautery of turbinates  1990's  . Partial knee replacement  04/2009    Left  . Knee arthroscopy  11/2010    Right  . Tubal ligation    . Thyroidectomy  05/08/2012    Procedure: THYROIDECTOMY;  Surgeon: Velora Heckler, MD;  Location: [facility];  Service: General;  Laterality: N/A;  Total Thyriodectomy  . Colonoscopy    . Ganglion cyst excision Right 06/25/2013    Procedure: RIGHT ANKLE GANGLION CYST EXCISION;  Surgeon: Toni Arthurs, MD;  Location: [facility];  Service: Orthopedics;  Laterality: Right;    Family History  Problem Relation Age of Onset  . Colon cancer Maternal Grandfather   . Diabetes Mother   . Heart disease Mother   . Breast cancer Mother   . Pulmonary embolism Father   . Hypertension Sister    Social History:  reports that she has never smoked. She has never used smokeless tobacco. She reports that  she does not drink alcohol or use illicit drugs.  Allergies:  Allergies  Allergen Reactions  . Cephalexin Hives  . Zithromax [Azithromycin] Other (See Comments)    Gi upset     Medications Prior to Admission  Medication Sig Dispense Refill  . Cholecalciferol (VITAMIN D) 400 UNITS capsule Take 400 Units by mouth daily.      Marland Kitchen doxycycline (DORYX) 100 MG DR capsule Take 100 mg by mouth 2 (two) times daily.      . fluticasone (FLONASE) 50 MCG/ACT nasal spray Place 2 sprays into the nose daily.       . hyoscyamine (LEVSIN SL) 0.125 MG SL tablet Take 1-2 tablets by mouth before meals as needed  90 tablet  11  . lansoprazole (PREVACID) 15 MG capsule Take 15 mg by mouth daily.      Marland Kitchen levothyroxine (SYNTHROID, LEVOTHROID) 150 MCG tablet Take 150 mcg by mouth daily.      Marland Kitchen losartan (COZAAR) 50 MG tablet Take 50 mg by mouth every morning.       . metroNIDAZOLE (METROCREAM) 0.75 % cream Apply 1 application topically daily.       . Naproxen Sodium (ALEVE) 220 MG CAPS Take  440 mg by mouth every 8 (eight) hours as needed. pain      . NORCO 5-325 MG per tablet Take 1-2 tablets by mouth every 6 (six) hours as needed for pain.  30 tablet  0  . Probiotic Product (RESTORA) CAPS Take 1 capsule by mouth daily.  30 capsule  11  . tolterodine (DETROL LA) 4 MG 24 hr capsule Take 2 mg by mouth every morning.       . zolpidem (AMBIEN) 10 MG tablet Take 0.5 tablets (5 mg total) by mouth at bedtime as needed. For sleep.  30 tablet  0  . AMBULATORY NON FORMULARY MEDICATION Medication Name: CoHosh 541 mg. Take 1 capsule once daily      . calcium carbonate (OS-CAL - DOSED IN MG OF ELEMENTAL CALCIUM) 1250 MG tablet Take 2 tablets (1,000 mg of elemental calcium total) by mouth 2 (two) times daily.  60 tablet  1    Results for orders placed during the hospital encounter of 07/30/13 (from the past 48 hour(s))  POCT HEMOGLOBIN-HEMACUE     Status: None   Collection Time    Jul 30, 2013  1:32 PM      Result Value Range    Hemoglobin 13.4  12.0 - 15.0 g/dL   No results found.  ROS  No recent f/c/n/v/wt loss  Blood pressure 146/85, pulse 82, temperature 98.1 F (36.7 C), temperature source Oral, resp. rate 20, height 5\' 7"  (1.702 m), weight 93.441 kg (206 lb), SpO2 98.00%. Physical Exam  wn wd woman in nad.  A and x 4.  Mood and affec tnormal.  EOMI.  resp unlabored.  R ankle with healend incision proximally and distally but a small opening in the central portion of the wound about 5 mm in length.  There is a small amount of peripheral cellulitis and tnederness.  Serosang drainage from the wound.  5/5 strength in PF and DF of the ankle.  Sens to LT intact at the foot.  Assessment/Plan Right medial ankle wound dehiscence and celllulitis s/p excision of right ankle ganglion cyst a month ago.  To OR for I and D and possible closure. The risks and benefits of the alternative treatment options have been discussed in detail.  The patient wishes to proceed with surgery and specifically understands risks of bleeding, infection, nerve damage, blood clots, need for additional surgery, amputation and death.   Toni Arthurs 07-30-2013, 1:47 PM

## 2013-07-23 NOTE — Anesthesia Procedure Notes (Signed)
Procedure Name: LMA Insertion Date/Time: 07/23/2013 2:05 PM Performed by: Toni Arthurs Pre-anesthesia Checklist: Patient identified, Emergency Drugs available, Suction available and Patient being monitored Patient Re-evaluated:Patient Re-evaluated prior to inductionOxygen Delivery Method: Circle System Utilized Preoxygenation: Pre-oxygenation with 100% oxygen Intubation Type: IV induction Ventilation: Mask ventilation without difficulty LMA: LMA inserted LMA Size: 4.0 Number of attempts: 1 Airway Equipment and Method: bite block Placement Confirmation: positive ETCO2 and breath sounds checked- equal and bilateral Tube secured with: Tape Dental Injury: Teeth and Oropharynx as per pre-operative assessment

## 2013-07-23 NOTE — Brief Op Note (Signed)
07/23/2013  2:40 PM  PATIENT:  [name]  57 y.o. female  PRE-OPERATIVE DIAGNOSIS:  Right Ankle cellulitis and wound dehiscence  POST-OPERATIVE DIAGNOSIS:  same  Procedure(s): IRRIGATION AND DEBRIDEMENT OF RIGHT ANKLE wound with closure of wound  SURGEON:  Toni Arthurs, MD  ASSISTANT: Scott Flowers, PA-C  ANESTHESIA:   General  EBL:  minimal   TOURNIQUET:  approx 20 min with an ankle esmarch  COMPLICATIONS:  None apparent  Specimen:  Deep tissue to micro for aerobic and aerobic culture  DISPOSITION:  Extubated, awake and stable to recovery.  DICTATION ID:  [id number]

## 2013-07-23 NOTE — Anesthesia Preprocedure Evaluation (Signed)
Anesthesia Evaluation  Patient identified by MRN, date of birth, ID band Patient awake    Reviewed: Allergy & Precautions, H&P , NPO status , Patient's Chart, lab work & pertinent test results  Airway Mallampati: II TM Distance: >3 FB     Dental  (+) Teeth Intact, Chipped and Dental Advidsory Given   Pulmonary neg pulmonary ROS,  breath sounds clear to auscultation        Cardiovascular hypertension, negative cardio ROS  Rhythm:regular Rate:Normal     Neuro/Psych  Headaches, negative psych ROS   GI/Hepatic negative GI ROS, GERD-  Medicated,  Endo/Other  negative endocrine ROSHypothyroidism   Renal/GU negative Renal ROS     Musculoskeletal   Abdominal   Peds  Hematology  (+) anemia ,   Anesthesia Other Findings   Reproductive/Obstetrics negative OB ROS                           Anesthesia Physical Anesthesia Plan  ASA: II  Anesthesia Plan: General LMA   Post-op Pain Management:    Induction:   Airway Management Planned:   Additional Equipment:   Intra-op Plan:   Post-operative Plan:   Informed Consent: I have reviewed the patients History and Physical, chart, labs and discussed the procedure including the risks, benefits and alternatives for the proposed anesthesia with the patient or authorized representative who has indicated his/her understanding and acceptance.   Dental Advisory Given  Plan Discussed with: Anesthesiologist, CRNA and Surgeon  Anesthesia Plan Comments:         Anesthesia Quick Evaluation

## 2013-07-24 ENCOUNTER — Encounter (HOSPITAL_BASED_OUTPATIENT_CLINIC_OR_DEPARTMENT_OTHER): Payer: Self-pay | Admitting: Orthopedic Surgery

## 2013-07-24 NOTE — Anesthesia Postprocedure Evaluation (Signed)
Anesthesia Post Note  Patient: [name]  Procedure(s) Performed: Procedure(s) (LRB): IRRIGATION AND DEBRIDEMENT OF RIGHT ANKLE (Right)  Anesthesia type: general  Patient location: PACU  Post pain: Pain level controlled  Post assessment: Patient's Cardiovascular Status Stable  Last Vitals:  Filed Vitals:   07/23/13 1630  BP: 148/77  Pulse: 75  Temp: 36.8 C  Resp: 16    Post vital signs: Reviewed and stable  Level of consciousness: sedated  Complications: No apparent anesthesia complications

## 2013-07-24 NOTE — Op Note (Signed)
[name], [name]                ACCOUNT NO.:  [id number]  MEDICAL RECORD NO.:  [id number]  LOCATION:                                 FACILITY:  PHYSICIAN:  Toni Arthurs, MD        DATE OF BIRTH:  03-14-1956  DATE OF PROCEDURE:  07/23/2013 DATE OF DISCHARGE:                              OPERATIVE REPORT   PREOPERATIVE DIAGNOSIS:  Right ankle cellulitis and wound dehiscence.  POSTOPERATIVE DIAGNOSIS:  Right ankle cellulitis and wound dehiscence.  PROCEDURE:  Irrigation and debridement of right ankle wound with closure.  SURGEON:  Toni Arthurs, MD  ASSISTANT:  Lorin Picket Flowers PA-C  ANESTHESIA:  General.  ESTIMATED BLOOD LOSS:  Minimal.  TOURNIQUET TIME:  Approximately 20 minutes with an ankle Esmarch.  COMPLICATIONS:  None apparent.  SPECIMEN:  deep tissue to micro for aerobic anaerobic culture.  DISPOSITION:  Extubated awake and stable to recovery.  INDICATIONS FOR PROCEDURE:  The patient is a 57 year old woman who is now a month status post excision of a ganglion cyst from her right ankle.  She presents now with dehiscence of the central portion of the wound and continued drainage.  I saw her in clinic 2 days ago, and there was some mild cellulitis noted around the periphery of the wound.  I started her on doxycycline and she presents now for I and D of the wound.  She understands the risks and benefits, the alternative treatment options and elects surgical treatment.  She specifically understands risks of bleeding, infection, nerve damage, blood clots, need for additional surgery, amputation, and death.  PROCEDURE IN DETAIL:  After preoperative consent was obtained, the correct operative site was identified.  The patient was brought to the operating room and placed supine on the operating table.  General anesthesia was induced.  Preoperative antibiotics were administered. Surgical time-out was taken.  The right lower extremity was prepped and draped in standard  sterile fashion.  Preoperative antibiotics were held for the deep cultures to be obtained.  The foot was exsanguinated and a 4-inch Esmarch tourniquet was wrapped around the ankle.  The previous incision was opened again sharply with a 15 blade.  The area of the previous ganglion cyst incision was noted to be filled with serosanguineous fluid.  This was all debrided sharply with a knife and rongeur as well as a curette.  After thorough debridement of all the fibrinous material, the wound was irrigated copiously with 3 L of normal saline.  Again the soft tissues were debrided and the skin edges were excised sharply with a #15 blade.  Again the wound was irrigated copiously.  The subcutaneous tissue was then approximated in layers with 2-0 PDS simple sutures.  The skin was closed with horizontal mattress sutures of 2-0 nylon.  Sterile dressings were applied followed by compression wrap.  The tourniquet was released at approximately 20 minutes.  The patient was awakened by Anesthesia and transported to recovery room in stable condition.  A g of IV vancomycin was administered after the cultures were obtained.  FOLLOWUP PLAN:  The patient will be weightbearing as tolerated on her right foot.  She will follow up with me  in a week for a wound check.     Toni Arthurs, MD     JH/MEDQ  D:  07/23/2013  T:  07/24/2013  Job:  [id number]

## 2013-07-27 LAB — WOUND CULTURE

## 2013-07-28 LAB — ANAEROBIC CULTURE

## 2013-08-05 ENCOUNTER — Other Ambulatory Visit (HOSPITAL_COMMUNITY): Payer: Self-pay | Admitting: Obstetrics and Gynecology

## 2013-08-05 DIAGNOSIS — Z1231 Encounter for screening mammogram for malignant neoplasm of breast: Secondary | ICD-10-CM

## 2013-08-10 HISTORY — PX: WOUND DEBRIDEMENT: SHX247

## 2013-08-27 ENCOUNTER — Ambulatory Visit (HOSPITAL_COMMUNITY)
Admission: RE | Admit: 2013-08-27 | Discharge: 2013-08-27 | Disposition: A | Discharge status: Home / Self Care | Payer: BC Managed Care – PPO | Source: Ambulatory Visit | Attending: Obstetrics and Gynecology | Admitting: Obstetrics and Gynecology

## 2013-08-27 DIAGNOSIS — Z1231 Encounter for screening mammogram for malignant neoplasm of breast: Secondary | ICD-10-CM | POA: Insufficient documentation

## 2013-10-08 ENCOUNTER — Other Ambulatory Visit: Payer: Self-pay | Admitting: Gastroenterology

## 2013-10-19 ENCOUNTER — Other Ambulatory Visit: Payer: Self-pay | Admitting: Gastroenterology

## 2013-10-19 NOTE — Telephone Encounter (Signed)
NEEDS OFFICE VISIT.

## 2013-10-22 ENCOUNTER — Ambulatory Visit (INDEPENDENT_AMBULATORY_CARE_PROVIDER_SITE_OTHER): Payer: BC Managed Care – PPO | Admitting: Gastroenterology

## 2013-10-22 ENCOUNTER — Encounter: Payer: Self-pay | Admitting: Gastroenterology

## 2013-10-22 VITALS — BP 106/62 | HR 76 | Ht 66.5 in | Wt 199.0 lb

## 2013-10-22 DIAGNOSIS — R197 Diarrhea, unspecified: Secondary | ICD-10-CM

## 2013-10-22 DIAGNOSIS — K589 Irritable bowel syndrome without diarrhea: Secondary | ICD-10-CM

## 2013-10-22 DIAGNOSIS — Z86010 Personal history of colonic polyps: Secondary | ICD-10-CM

## 2013-10-22 MED ORDER — HYOSCYAMINE SULFATE 0.125 MG SL SUBL: SUBLINGUAL_TABLET | SUBLINGUAL | Status: DC

## 2013-10-22 MED ORDER — RESTORA PO CAPS: ORAL_CAPSULE | ORAL | Status: DC

## 2013-10-22 NOTE — Patient Instructions (Signed)
We have sent the following medications to your pharmacy for you to pick up at your convenience: Restora, Levsin.  Thank you for choosing me and [facility].  Pricilla Riffle. Dagoberto Ligas., MD., Marval Regal

## 2013-10-22 NOTE — Progress Notes (Signed)
    History of Present Illness: This is a 58 year old female with a history of recurrent diarrhea. She underwent colonoscopy in August 2012 for evaluation of unexplained diarrhea. A small adenomatous colon polyp was removed. No evidence of colitis was noted and random biopsies were normal. She was hospitalized in November for right ankle cellulitis following a right ankle ganglion cyst removal. She was treated with antibiotics. She relates her diarrhea remains under excellent control as long as she remains on Restora. She has occasional cramping that improves with hyoscyamine. She's been out of Restora for several weeks and had one episode of diarrhea and cramping recently.  Current Medications, Allergies, Past Medical History, Past Surgical History, Family History and Social History were reviewed in [employer] record.  Physical Exam: General: Well developed , well nourished, no acute distress Head: Normocephalic and atraumatic Eyes:  sclerae anicteric, EOMI Ears: Normal auditory acuity Mouth: No deformity or lesions Lungs: Clear throughout to auscultation Heart: Regular rate and rhythm; no murmurs, rubs or bruits Abdomen: Soft, non tender and non distended. No masses, hepatosplenomegaly or hernias noted. Normal Bowel sounds Musculoskeletal: Symmetrical with no gross deformities  Pulses:  Normal pulses noted Extremities: No clubbing, cyanosis, edema or deformities noted Neurological: Alert oriented x 4, grossly nonfocal Psychological:  Alert and cooperative. Normal mood and affect  Assessment and Recommendations:  1. IBS-D. Continue Restora daily hyoscyamine before meals and as needed.  2. Personal history of adenomatous colon polyps. Five-year surveillance colonoscopy August 2017.

## 2014-06-25 ENCOUNTER — Other Ambulatory Visit: Payer: Self-pay

## 2014-07-30 ENCOUNTER — Other Ambulatory Visit (HOSPITAL_COMMUNITY): Payer: Self-pay | Admitting: Obstetrics and Gynecology

## 2014-07-30 DIAGNOSIS — Z1231 Encounter for screening mammogram for malignant neoplasm of breast: Secondary | ICD-10-CM

## 2014-08-16 ENCOUNTER — Other Ambulatory Visit: Payer: Self-pay | Admitting: Obstetrics and Gynecology

## 2014-08-16 DIAGNOSIS — Z803 Family history of malignant neoplasm of breast: Secondary | ICD-10-CM

## 2014-08-16 DIAGNOSIS — R922 Inconclusive mammogram: Secondary | ICD-10-CM

## 2014-08-16 DIAGNOSIS — Z9189 Other specified personal risk factors, not elsewhere classified: Secondary | ICD-10-CM

## 2014-08-30 ENCOUNTER — Ambulatory Visit (HOSPITAL_COMMUNITY)
Admission: RE | Admit: 2014-08-30 | Discharge: 2014-08-30 | Disposition: A | Discharge status: Home / Self Care | Payer: BC Managed Care – PPO | Source: Ambulatory Visit | Attending: Obstetrics and Gynecology | Admitting: Obstetrics and Gynecology

## 2014-08-30 DIAGNOSIS — Z1231 Encounter for screening mammogram for malignant neoplasm of breast: Secondary | ICD-10-CM | POA: Diagnosis present

## 2014-08-31 ENCOUNTER — Other Ambulatory Visit: Payer: BC Managed Care – PPO

## 2014-08-31 ENCOUNTER — Other Ambulatory Visit: Payer: Self-pay | Admitting: Obstetrics and Gynecology

## 2014-08-31 DIAGNOSIS — R928 Other abnormal and inconclusive findings on diagnostic imaging of breast: Secondary | ICD-10-CM

## 2014-09-05 ENCOUNTER — Other Ambulatory Visit: Payer: BC Managed Care – PPO

## 2014-09-07 ENCOUNTER — Ambulatory Visit
Admission: RE | Admit: 2014-09-07 | Discharge: 2014-09-07 | Disposition: A | Discharge status: Home / Self Care | Payer: BC Managed Care – PPO | Source: Ambulatory Visit | Attending: Obstetrics and Gynecology | Admitting: Obstetrics and Gynecology

## 2014-09-07 DIAGNOSIS — R928 Other abnormal and inconclusive findings on diagnostic imaging of breast: Secondary | ICD-10-CM

## 2014-09-08 ENCOUNTER — Ambulatory Visit
Admission: RE | Admit: 2014-09-08 | Discharge: 2014-09-08 | Disposition: A | Discharge status: Home / Self Care | Payer: BC Managed Care – PPO | Source: Ambulatory Visit | Attending: Obstetrics and Gynecology | Admitting: Obstetrics and Gynecology

## 2014-09-08 DIAGNOSIS — R922 Inconclusive mammogram: Secondary | ICD-10-CM

## 2014-09-08 DIAGNOSIS — Z9189 Other specified personal risk factors, not elsewhere classified: Secondary | ICD-10-CM

## 2014-09-08 DIAGNOSIS — Z803 Family history of malignant neoplasm of breast: Secondary | ICD-10-CM

## 2014-09-08 MED ORDER — GADOBENATE DIMEGLUMINE 529 MG/ML IV SOLN
18.0000 mL | Freq: Once | INTRAVENOUS | Status: AC | PRN
  Administered 2014-09-08: 18 mL via INTRAVENOUS

## 2014-11-27 ENCOUNTER — Other Ambulatory Visit: Payer: Self-pay | Admitting: Gastroenterology

## 2014-11-29 NOTE — Telephone Encounter (Signed)
NEEDS OFFICE VISIT.

## 2015-01-06 ENCOUNTER — Other Ambulatory Visit: Payer: Self-pay | Admitting: Gastroenterology

## 2015-01-10 ENCOUNTER — Telehealth: Payer: Self-pay | Admitting: Gastroenterology

## 2015-01-10 NOTE — Telephone Encounter (Signed)
Left a message for patient to return my call. 

## 2015-01-11 NOTE — Telephone Encounter (Signed)
Patient left me a voicemail stating she was told by her insurance to switch from [place] to [facility]. Patient wants to make sure that this is another probiotic that Dr. Fuller Plan recommends and is ok to switch to. She states she tried [employer] but there was no improvement in her symptoms. She states I can call and leave a voicemail today when I call her back. Left voicemail stating she can switch to [facility] and that is was another good option for her to take in the place of Restora. Call back if she has any other questions.

## 2015-01-11 NOTE — Telephone Encounter (Signed)
Left a message for patient to return my call. 

## 2015-01-24 ENCOUNTER — Telehealth: Payer: Self-pay | Admitting: Gastroenterology

## 2015-01-24 MED ORDER — RESTORA PO CAPS: ORAL_CAPSULE | ORAL | Status: DC

## 2015-01-24 NOTE — Telephone Encounter (Signed)
Prescription sent for Restora for one month until patient's schedules f/u visit.

## 2015-03-02 ENCOUNTER — Other Ambulatory Visit: Payer: Self-pay | Admitting: Gastroenterology

## 2015-11-14 ENCOUNTER — Other Ambulatory Visit: Payer: Self-pay

## 2015-11-14 DIAGNOSIS — Z1231 Encounter for screening mammogram for malignant neoplasm of breast: Secondary | ICD-10-CM

## 2015-11-21 ENCOUNTER — Ambulatory Visit
Admission: RE | Admit: 2015-11-21 | Discharge: 2015-11-21 | Disposition: A | Discharge status: Home / Self Care | Payer: BLUE CROSS/BLUE SHIELD | Source: Ambulatory Visit

## 2015-11-21 DIAGNOSIS — Z1231 Encounter for screening mammogram for malignant neoplasm of breast: Secondary | ICD-10-CM

## 2016-01-03 ENCOUNTER — Encounter: Payer: Self-pay | Admitting: Gastroenterology

## 2016-01-03 ENCOUNTER — Ambulatory Visit: Payer: Self-pay | Admitting: Gastroenterology

## 2016-01-03 ENCOUNTER — Ambulatory Visit (INDEPENDENT_AMBULATORY_CARE_PROVIDER_SITE_OTHER): Payer: BLUE CROSS/BLUE SHIELD | Admitting: Gastroenterology

## 2016-01-03 VITALS — BP 146/78 | HR 80 | Ht 66.5 in | Wt 201.0 lb

## 2016-01-03 DIAGNOSIS — R197 Diarrhea, unspecified: Secondary | ICD-10-CM | POA: Diagnosis not present

## 2016-01-03 DIAGNOSIS — Z86010 Personal history of colonic polyps: Secondary | ICD-10-CM

## 2016-01-03 MED ORDER — NA SULFATE-K SULFATE-MG SULF 17.5-3.13-1.6 GM/177ML PO SOLN: 1.0000 | Freq: Once | ORAL | Status: DC

## 2016-01-03 MED ORDER — GLYCOPYRROLATE 2 MG PO TABS: 2.0000 mg | ORAL_TABLET | Freq: Two times a day (BID) | ORAL | Status: DC

## 2016-01-03 NOTE — Progress Notes (Signed)
    History of Present Illness: This is a 60 year old female with diarrhea for 2 months. Colonoscopy to [facility] in 04/2011 for diarrhea showed one tubular adenoma and random biopsies were negative. She was felt to have IBS. For the past few years she has had 2 BMs/week however she has noted diarrhea occuring in the morning for the past few months. No diarrhea at other times. No new medications, no diet changes, antibiotic usage, travel. Diarrhea has interfered with getting to work in the morning. Denies weight loss, abdominal pain, constipation, change in stool caliber, melena, hematochezia, nausea, vomiting, dysphagia, reflux symptoms, chest pain.  Current Medications, Allergies, Past Medical History, Past Surgical History, Family History and Social History were reviewed in [employer] record.  Physical Exam: General: Well developed, well nourished, no acute distress Head: Normocephalic and atraumatic Eyes:  sclerae anicteric, EOMI Ears: Normal auditory acuity Mouth: No deformity or lesions Lungs: Clear throughout to auscultation Heart: Regular rate and rhythm; no murmurs, rubs or bruits Abdomen: Soft, non tender and non distended. No masses, hepatosplenomegaly or hernias noted. Normal Bowel sounds Rectal: deferred to colonoscopy Musculoskeletal: Symmetrical with no gross deformities  Pulses:  Normal pulses noted Extremities: No clubbing, cyanosis, edema or deformities noted Neurological: Alert oriented x 4, grossly nonfocal Psychological:  Alert and cooperative. Normal mood and affect  Assessment and Recommendations:  1. Diarrhea with history of IBS. Lactose free diet for 7-10 days. Avoid foods that trigger symptoms. Glycopyrrolate 2 mg po qam and use qpm if needed. Imodium qam if diarrhea not controlled with above. Schedule colonoscopy. The risks (including bleeding, perforation, infection, missed lesions, medication reactions and possible hospitalization or surgery if  complications occur), benefits, and alternatives to colonoscopy with possible biopsy and possible polypectomy were discussed with the patient and they consent to proceed.   2. Personal history of adenomatous colon polyps. 5 year colonoscopy is due this summer. Colonoscopy as above.

## 2016-01-03 NOTE — Patient Instructions (Signed)
We have sent the following medications to your pharmacy for you to pick up at your convenience:robinul.  Start taking 1-2 tablets of Imodium and 1 tablet of robinul every morning.  You have been scheduled for a colonoscopy. Please follow written instructions given to you at your visit today.  Please pick up your prep supplies at the pharmacy within the next 1-3 days. If you use inhalers (even only as needed), please bring them with you on the day of your procedure. Your physician has requested that you go to www.startemmi.com and enter the access code given to you at your visit today. This web site gives a general overview about your procedure. However, you should still follow specific instructions given to you by our office regarding your preparation for the procedure.  Normal BMI (Body Mass Index- based on height and weight) is between 19 and 25. Your BMI today is Body mass index is 31.96 kg/(m^2). Marland Kitchen Please consider follow up  regarding your BMI with your Primary Care Provider.  Today your blood pressure was elevated. Please follow up with your Primary Care Provider for blood pressure management.  Thank you for choosing me and [facility].  Pricilla Riffle. Dagoberto Ligas., MD., Marval Regal

## 2016-01-04 ENCOUNTER — Telehealth: Payer: Self-pay | Admitting: Gastroenterology

## 2016-01-04 NOTE — Telephone Encounter (Signed)
Patient states robinul 1 mg is cheaper if she can get that sent in the place of the 2 mg dose robinul. I informed patient I can ask Dr. Fuller Plan but it may not work as well as the 2 mg. Patient states well will just take two of the robinul 1 mg to equal the 2 mg tablet. I informed patient that Dr. Fuller Plan prescribed robinul 2 mg twice daily. Patient states she mis understood. She states never mind and she will just pick up the robinul 2 mg dose.

## 2016-01-16 ENCOUNTER — Encounter: Payer: Self-pay | Admitting: Gastroenterology

## 2016-01-19 ENCOUNTER — Telehealth: Payer: Self-pay | Admitting: Gastroenterology

## 2016-01-19 MED ORDER — HYOSCYAMINE SULFATE 0.125 MG SL SUBL: 0.1250 mg | SUBLINGUAL_TABLET | Freq: Three times a day (TID) | SUBLINGUAL | Status: DC

## 2016-01-19 NOTE — Telephone Encounter (Signed)
Patient reports terrible dry mouth with Robinul.  She would like to go back to hyoscyamine.  I sent her in a refill

## 2016-01-30 ENCOUNTER — Encounter: Payer: Self-pay | Admitting: Gastroenterology

## 2016-01-30 ENCOUNTER — Ambulatory Visit (AMBULATORY_SURGERY_CENTER): Payer: BLUE CROSS/BLUE SHIELD | Admitting: Gastroenterology

## 2016-01-30 VITALS — BP 139/70 | HR 70 | Temp 97.3°F | Resp 18 | Ht 66.0 in | Wt 201.0 lb

## 2016-01-30 DIAGNOSIS — R197 Diarrhea, unspecified: Secondary | ICD-10-CM

## 2016-01-30 DIAGNOSIS — Z86010 Personal history of colonic polyps: Secondary | ICD-10-CM

## 2016-01-30 MED ORDER — SODIUM CHLORIDE 0.9 % IV SOLN: 500.0000 mL | INTRAVENOUS | Status: DC

## 2016-01-30 NOTE — Progress Notes (Signed)
Report to PACU, RN, vss, BBS= Clear.  

## 2016-01-30 NOTE — Patient Instructions (Signed)
YOU HAD AN ENDOSCOPIC PROCEDURE TODAY AT THE [facility]:   Refer to the procedure report that was given to you for any specific questions about what was found during the examination.  If the procedure report does not answer your questions, please call your gastroenterologist to clarify.  If you requested that your care partner not be given the details of your procedure findings, then the procedure report has been included in a sealed envelope for you to review at your convenience later.  YOU SHOULD EXPECT: Some feelings of bloating in the abdomen. Passage of more gas than usual.  Walking can help get rid of the air that was put into your GI tract during the procedure and reduce the bloating. If you had a lower endoscopy (such as a colonoscopy or flexible sigmoidoscopy) you may notice spotting of blood in your stool or on the toilet paper. If you underwent a bowel prep for your procedure, you may not have a normal bowel movement for a few days.  Please Note:  You might notice some irritation and congestion in your nose or some drainage.  This is from the oxygen used during your procedure.  There is no need for concern and it should clear up in a day or so.  SYMPTOMS TO REPORT IMMEDIATELY:   Following lower endoscopy (colonoscopy or flexible sigmoidoscopy):  Excessive amounts of blood in the stool  Significant tenderness or worsening of abdominal pains  Swelling of the abdomen that is new, acute  Fever of 100F or higher   For urgent or emergent issues, a gastroenterologist can be reached at any hour by calling [phone].   DIET: Your first meal following the procedure should be a small meal and then it is ok to progress to your normal diet. Heavy or fried foods are harder to digest and may make you feel nauseous or bloated.  Likewise, meals heavy in dairy and vegetables can increase bloating.  Drink plenty of fluids but you should avoid alcoholic beverages for 24  hours.  ACTIVITY:  You should plan to take it easy for the rest of today and you should NOT DRIVE or use heavy machinery until tomorrow (because of the sedation medicines used during the test).    FOLLOW UP: Our staff will call the number listed on your records the next business day following your procedure to check on you and address any questions or concerns that you may have regarding the information given to you following your procedure. If we do not reach you, we will leave a message.  However, if you are feeling well and you are not experiencing any problems, there is no need to return our call.  We will assume that you have returned to your regular daily activities without incident.  If any biopsies were taken you will be contacted by phone or by letter within the next 1-3 weeks.  Please call us at [phone] if you have not heard about the biopsies in 3 weeks.    SIGNATURES/CONFIDENTIALITY: You and/or your care partner have signed paperwork which will be entered into your electronic medical record.  These signatures attest to the fact that that the information above on your After Visit Summary has been reviewed and is understood.  Full responsibility of the confidentiality of this discharge information lies with you and/or your care-partner. 

## 2016-01-30 NOTE — Progress Notes (Signed)
Called to room to assist during endoscopic procedure.  Patient ID and intended procedure confirmed with present staff. Received instructions for my participation in the procedure from the performing physician.  

## 2016-01-30 NOTE — Op Note (Signed)
[place] Patient Name: [name] Procedure Date: 01/30/2016 2:17 PM MRN: [id number] Endoscopist: Ladene Artist , MD Age: 60 Referring MD:  Date of Birth: 1956/06/18 Gender: Female Procedure:                Colonoscopy Indications:              Clinically significant diarrhea of unexplained                            origin. Personal history of adenomatous colon                            polyps. Medicines:                Monitored Anesthesia Care Procedure:                Pre-Anesthesia Assessment:                           - Prior to the procedure, a History and Physical                            was performed, and patient medications and                            allergies were reviewed. The patient's tolerance of                            previous anesthesia was also reviewed. The risks                            and benefits of the procedure and the sedation                            options and risks were discussed with the patient.                            All questions were answered, and informed consent                            was obtained. Prior Anticoagulants: The patient has                            taken no previous anticoagulant or antiplatelet                            agents. ASA Grade Assessment: II - A patient with                            mild systemic disease. After reviewing the risks                            and benefits, the patient was deemed in  satisfactory condition to undergo the procedure.                           After obtaining informed consent, the colonoscope                            was passed under direct vision. Throughout the                            procedure, the patient's blood pressure, pulse, and                            oxygen saturations were monitored continuously. The                            Model PCF-H190L [id number]) scope was introduced   through the anus and advanced to the the cecum,                            identified by appendiceal orifice and ileocecal                            valve. The colonoscopy was performed without                            difficulty. The patient tolerated the procedure                            well. The quality of the bowel preparation was                            good. The ileocecal valve, appendiceal orifice, and                            rectum were photographed. Scope In: 2:28:11 PM Scope Out: 2:41:05 PM Scope Withdrawal Time: 0 hours 10 minutes 5 seconds  Total Procedure Duration: 0 hours 12 minutes 54 seconds  Findings:                 The digital rectal exam was normal.                           Many small-mouthed diverticula were found in the                            sigmoid colon and descending colon. There was                            evidence of diverticular spasm. There was no                            evidence of diverticular bleeding.                           A few small-mouthed diverticula were found in the  ascending colon. There was no evidence of                            diverticular bleeding. Biopsies were taken with a                            cold forceps for histology. Random biopsies were                            taken throughout the colon with a cold forceps for                            histology.                           The exam was otherwise normal throughout the                            examined colon.                           The retroflexed view of the distal rectum and anal                            verge was normal and showed no anal or rectal                            abnormalities. Complications:            No immediate complications. Estimated Blood Loss:     Estimated blood loss: none. Impression:               - Moderate diverticulosis in the sigmoid colon and                            in the  descending colon.                           - Mild diverticulosis in the ascending colon. There                            was no evidence of diverticular bleeding.                           - The distal rectum and anal verge are normal on                            retroflexion view. Recommendation:           - High fiber diet indefinitely.                           - Continue present medications.                           - Await pathology results.                           -  Repeat colonoscopy in 5 years for surveillance. Ladene Artist, MD 01/30/2016 2:47:47 PM This report has been signed electronically.

## 2016-01-31 ENCOUNTER — Telehealth: Payer: Self-pay

## 2016-01-31 NOTE — Telephone Encounter (Signed)
  Follow up Call-  Call back number 01/30/2016  Post procedure Call Back phone  # [phone]  Permission to leave phone message Yes     Patient was called for follow up after her procedure on 01/30/2016. No answer at the number given for follow up phone call. A message was left on the answering machine.

## 2016-02-02 ENCOUNTER — Telehealth: Payer: Self-pay | Admitting: Gastroenterology

## 2016-02-02 NOTE — Telephone Encounter (Signed)
Reviewed diagnosis codes from procedure report.  All questions answered

## 2016-02-02 NOTE — Telephone Encounter (Signed)
Left message for patient to call back  

## 2016-02-07 ENCOUNTER — Encounter: Payer: Self-pay | Admitting: Gastroenterology

## 2017-02-06 ENCOUNTER — Other Ambulatory Visit: Payer: Self-pay | Admitting: Obstetrics and Gynecology

## 2017-02-06 DIAGNOSIS — Z1231 Encounter for screening mammogram for malignant neoplasm of breast: Secondary | ICD-10-CM

## 2017-02-25 ENCOUNTER — Ambulatory Visit
Admission: RE | Admit: 2017-02-25 | Discharge: 2017-02-25 | Disposition: A | Discharge status: Home / Self Care | Payer: BLUE CROSS/BLUE SHIELD | Source: Ambulatory Visit | Attending: Obstetrics and Gynecology | Admitting: Obstetrics and Gynecology

## 2017-02-25 DIAGNOSIS — Z1231 Encounter for screening mammogram for malignant neoplasm of breast: Secondary | ICD-10-CM

## 2017-03-10 DIAGNOSIS — IMO0002 Reserved for concepts with insufficient information to code with codable children: Secondary | ICD-10-CM

## 2017-03-10 HISTORY — DX: Reserved for concepts with insufficient information to code with codable children: IMO0002

## 2017-03-25 NOTE — Patient Instructions (Addendum)
[name]  03/25/2017   Your procedure is scheduled on: 04-01-17   Report to [facility] Main Entrance Report to Admitting at 9:45 AM   Call this number if you have problems the morning of surgery [phone]   Remember: ONLY 1 PERSON MAY GO WITH YOU TO SHORT STAY TO GET  READY MORNING OF [name].  Do not eat food or drink liquids :After Midnight.     Take these medicines the morning of surgery with A SIP OF WATER: Lansoprazole (Prevacid)                                 You may not have any metal on your body including hair pins and              piercings  Do not wear jewelry, make-up, lotions, powders or perfumes, deodorant             Do not wear nail polish.  Do not shave  48 hours prior to surgery.                 Do not bring valuables to the hospital. [name].  Contacts, dentures or bridgework may not be worn into surgery.  Leave suitcase in the car. After surgery it may be brought to your room.                 Please read over the following fact sheets you were given: _____________________________________________________________________             [facility] - Preparing for Surgery Before surgery, you can play an important role.  Because skin is not sterile, your skin needs to be as free of germs as possible.  You can reduce the number of germs on your skin by washing with CHG (chlorahexidine gluconate) soap before surgery.  CHG is an antiseptic cleaner which kills germs and bonds with the skin to continue killing germs even after washing. Please DO NOT use if you have an allergy to CHG or antibacterial soaps.  If your skin becomes reddened/irritated stop using the CHG and inform your nurse when you arrive at Short Stay. Do not shave (including legs and underarms) for at least 48 hours prior to the first CHG shower.  You may shave your face/neck. Please follow these instructions  carefully:  1.  Shower with CHG Soap the night before surgery and the  morning of Surgery.  2.  If you choose to wash your hair, wash your hair first as usual with your  normal  shampoo.  3.  After you shampoo, rinse your hair and body thoroughly to remove the  shampoo.                           4.  Use CHG as you would any other liquid soap.  You can apply chg directly  to the skin and wash                       Gently with a scrungie or clean washcloth.  5.  Apply the CHG Soap to your body ONLY FROM THE NECK DOWN.   Do not  use on face/ open                           Wound or open sores. Avoid contact with eyes, ears mouth and genitals (private parts).                       Wash face,  Genitals (private parts) with your normal soap.             6.  Wash thoroughly, paying special attention to the area where your surgery  will be performed.  7.  Thoroughly rinse your body with warm water from the neck down.  8.  DO NOT shower/wash with your normal soap after using and rinsing off  the CHG Soap.                9.  Pat yourself dry with a clean towel.            10.  Wear clean pajamas.            11.  Place clean sheets on your bed the night of your first shower and do not  sleep with pets. Day of Surgery : Do not apply any lotions/deodorants the morning of surgery.  Please wear clean clothes to the hospital/surgery center.  FAILURE TO FOLLOW THESE INSTRUCTIONS MAY RESULT IN THE CANCELLATION OF YOUR SURGERY PATIENT SIGNATURE_________________________________  NURSE SIGNATURE__________________________________  ________________________________________________________________________  ________________________________________________________________________   [name]  An incentive spirometer is a tool that can help keep your lungs clear and active. This tool measures how well you are filling your lungs with each breath. Taking long deep breaths may help reverse or decrease the  chance of developing breathing (pulmonary) problems (especially infection) following:  A long period of time when you are unable to move or be active. BEFORE THE PROCEDURE   If the spirometer includes an indicator to show your best effort, your nurse or respiratory therapist will set it to a desired goal.  If possible, sit up straight or lean slightly forward. Try not to slouch.  Hold the incentive spirometer in an upright position. INSTRUCTIONS FOR USE  1. Sit on the edge of your bed if possible, or sit up as far as you can in bed or on a chair. 2. Hold the incentive spirometer in an upright position. 3. Breathe out normally. 4. Place the mouthpiece in your mouth and seal your lips tightly around it. 5. Breathe in slowly and as deeply as possible, raising the piston or the ball toward the top of the column. 6. Hold your breath for 3-5 seconds or for as long as possible. Allow the piston or ball to fall to the bottom of the column. 7. Remove the mouthpiece from your mouth and breathe out normally. 8. Rest for a few seconds and repeat Steps 1 through 7 at least 10 times every 1-2 hours when you are awake. Take your time and take a few normal breaths between deep breaths. 9. The spirometer may include an indicator to show your best effort. Use the indicator as a goal to work toward during each repetition. 10. After each set of 10 deep breaths, practice coughing to be sure your lungs are clear. If you have an incision (the cut made at the time of surgery), support your incision when coughing by placing a pillow or rolled up towels firmly against it. Once you are able to get out of  bed, walk around indoors and cough well. You may stop using the incentive spirometer when instructed by your caregiver.  RISKS AND COMPLICATIONS  Take your time so you do not get dizzy or light-headed.  If you are in pain, you may need to take or ask for pain medication before doing incentive spirometry. It is harder  to take a deep breath if you are having pain. AFTER USE  Rest and breathe slowly and easily.  It can be helpful to keep track of a log of your progress. Your caregiver can provide you with a simple table to help with this. If you are using the spirometer at home, follow these instructions: [place] IF:   You are having difficultly using the spirometer.  You have trouble using the spirometer as often as instructed.  Your pain medication is not giving enough relief while using the spirometer.  You develop fever of 100.5 F (38.1 C) or higher. SEEK IMMEDIATE MEDICAL CARE IF:   You cough up bloody sputum that had not been present before.  You develop fever of 102 F (38.9 C) or greater.  You develop worsening pain at or near the incision site. MAKE SURE YOU:   Understand these instructions.  Will watch your condition.  Will get help right away if you are not doing well or get worse. Document Released: 01/07/2007 Document Revised: 11/19/2011 Document Reviewed: 03/10/2007 Exit[employer] Patient Information 2014 [employer], [place].   ________________________________________________________________________  WHAT IS A BLOOD TRANSFUSION? Blood Transfusion Information  A transfusion is the replacement of blood or some of its parts. Blood is made up of multiple cells which provide different functions.  Red blood cells carry oxygen and are used for blood loss replacement.  White blood cells fight against infection.  Platelets control bleeding.  Plasma helps clot blood.  Other blood products are available for specialized needs, such as hemophilia or other clotting disorders. BEFORE THE TRANSFUSION  Who gives blood for transfusions?   Healthy volunteers who are fully evaluated to make sure their blood is safe. This is blood bank blood. Transfusion therapy is the safest it has ever been in the practice of medicine. Before blood is taken from a donor, a complete history is taken  to make sure that person has no history of diseases nor engages in risky social behavior (examples are intravenous drug use or sexual activity with multiple partners). The donor's travel history is screened to minimize risk of transmitting infections, such as malaria. The donated blood is tested for signs of infectious diseases, such as HIV and hepatitis. The blood is then tested to be sure it is compatible with you in order to minimize the chance of a transfusion reaction. If you or a relative donates blood, this is often done in anticipation of surgery and is not appropriate for emergency situations. It takes many days to process the donated blood. RISKS AND COMPLICATIONS Although transfusion therapy is very safe and saves many lives, the main dangers of transfusion include:   Getting an infectious disease.  Developing a transfusion reaction. This is an allergic reaction to something in the blood you were given. Every precaution is taken to prevent this. The decision to have a blood transfusion has been considered carefully by your caregiver before blood is given. Blood is not given unless the benefits outweigh the risks. AFTER THE TRANSFUSION  Right after receiving a blood transfusion, you will usually feel much better and more energetic. This is especially true if your red blood  cells have gotten low (anemic). The transfusion raises the level of the red blood cells which carry oxygen, and this usually causes an energy increase.  The nurse administering the transfusion will monitor you carefully for complications. HOME CARE INSTRUCTIONS  No special instructions are needed after a transfusion. You may find your energy is better. Speak with your caregiver about any limitations on activity for underlying diseases you may have. SEEK MEDICAL CARE IF:   Your condition is not improving after your transfusion.  You develop redness or irritation at the intravenous (IV) site. SEEK IMMEDIATE MEDICAL CARE  IF:  Any of the following symptoms occur over the next 12 hours:  Shaking chills.  You have a temperature by mouth above 102 F (38.9 C), not controlled by medicine.  Chest, back, or muscle pain.  People around you feel you are not acting correctly or are confused.  Shortness of breath or difficulty breathing.  Dizziness and fainting.  You get a rash or develop hives.  You have a decrease in urine output.  Your urine turns a dark color or changes to pink, red, or brown. Any of the following symptoms occur over the next 10 days:  You have a temperature by mouth above 102 F (38.9 C), not controlled by medicine.  Shortness of breath.  Weakness after normal activity.  The white part of the eye turns yellow (jaundice).  You have a decrease in the amount of urine or are urinating less often.  Your urine turns a dark color or changes to pink, red, or brown. Document Released: 08/24/2000 Document Revised: 11/19/2011 Document Reviewed: 04/12/2008 [facility] Patient Information 2014 [place], [place].  _______________________________________________________________________

## 2017-03-26 ENCOUNTER — Encounter (HOSPITAL_COMMUNITY): Payer: Self-pay

## 2017-03-26 ENCOUNTER — Encounter (HOSPITAL_COMMUNITY)
Admission: RE | Admit: 2017-03-26 | Discharge: 2017-03-26 | Disposition: A | Discharge status: Home / Self Care | Payer: BLUE CROSS/BLUE SHIELD | Source: Ambulatory Visit | Attending: Orthopedic Surgery | Admitting: Orthopedic Surgery

## 2017-03-26 DIAGNOSIS — Z01818 Encounter for other preprocedural examination: Secondary | ICD-10-CM | POA: Diagnosis present

## 2017-03-26 DIAGNOSIS — M1711 Unilateral primary osteoarthritis, right knee: Secondary | ICD-10-CM | POA: Insufficient documentation

## 2017-03-26 LAB — SURGICAL PCR SCREEN
MRSA, PCR: NEGATIVE
Staphylococcus aureus: POSITIVE — AB

## 2017-03-28 NOTE — H&P (Signed)
UNICOMPARTMENTAL KNEE ADMISSION H&P  Patient is being admitted for right medial unicompartmental knee arthroplasty.  Subjective:  Chief Complaint:  Right knee medial compartmental primary OA /pain    HPI: [name], 61 y.o. female female, has a history of pain and functional disability in the right and has failed non-surgical conservative treatments for greater than 12 weeks to include NSAID's and/or analgesics, corticosteriod injections, use of assistive devices and activity modification.  Onset of symptoms was gradual, starting 2+ years ago with gradually worsening course since that time. The patient noted prior procedures on the knee to include  unicompartmental arthroplasty on the left knee, in August 2010.  Patient currently rates pain in the right knee(s) at 8 out of 10 with activity. Patient has night pain, worsening of pain with activity and weight bearing, pain that interferes with activities of daily living, pain with passive range of motion, crepitus and joint swelling.  Patient has evidence of periarticular osteophytes and joint space narrowing of the medial compartment by imaging studies.  There is no active infection.  Risks, benefits and expectations were discussed with the patient.  Risks including but not limited to the risk of anesthesia, blood clots, nerve damage, blood vessel damage, failure of the prosthesis, infection and up to and including death.  Patient understand the risks, benefits and expectations and wishes to proceed with surgery.   PCP: Harlan Stains, MD  D/C Plans:      Home  Post-op Meds:       No Rx given   Tranexamic Acid:      To be given - IV  Decadron:    It is to be given  FYI:     ASA  Norco   DME: Rx given for RW and 3-n-1  PT:  OPPT Rx given   Past Medical History:  Diagnosis Date  . Acne rosacea   . Allergic rhinitis   . Anemia   . Colitis   . Diverticulosis   . GERD (gastroesophageal reflux disease)   . Goiter   . Hip  bursitis   . Hypertension   . Hypothyroidism   . Migraines   . Multiple thyroid nodules   . Obesity   . Osteoarthritis   . Overactive bladder    carpal tunnel   . Shortness of breath    with exertion   . Tubular adenoma of colon 04/2011  . Wears glasses      Past Surgical History:  Procedure Laterality Date  . CAUTERY OF TURBINATES  1990's  . COLONOSCOPY    . GANGLION CYST EXCISION Right 06/25/2013   Procedure: RIGHT ANKLE GANGLION CYST EXCISION;  Surgeon: Wylene Simmer, MD;  Location: [facility];  Service: Orthopedics;  Laterality: Right;  . INCISION AND DRAINAGE OF WOUND Right 07/23/2013   Procedure: IRRIGATION AND DEBRIDEMENT OF RIGHT ANKLE;  Surgeon: Wylene Simmer, MD;  Location: [facility];  Service: Orthopedics;  Laterality: Right;  [place] used for tourniquet--on at 1417, off at 1437.  Marland Kitchen KNEE ARTHROSCOPY  11/2010   Right  . NASAL SEPTUM SURGERY  1991  . partial knee replacement  04/2009   Left  . THYROIDECTOMY  05/08/2012   Procedure: THYROIDECTOMY;  Surgeon: Earnstine Regal, MD;  Location: [facility];  Service: General;  Laterality: N/A;  Total Thyriodectomy  . TOTAL ABDOMINAL HYSTERECTOMY W/ BILATERAL SALPINGOOPHORECTOMY  2003  . TUBAL LIGATION    . WOUND DEBRIDEMENT Right 07/27/14   ankle    Allergies  Allergen Reactions  . Cephalexin Hives  . Macrobid [Nitrofurantoin Macrocrystal] Nausea And Vomiting and Other (See Comments)    Light headed  . Zithromax [Azithromycin] Other (See Comments)    Gi upset     Social History  Substance Use Topics  . Smoking status: Never Smoker  . Smokeless tobacco: Never Used  . Alcohol use No    Family History  Problem Relation Age of Onset  . Colon cancer Maternal Grandfather   . Diabetes Mother   . Heart disease Mother   . Breast cancer Mother   . Pulmonary embolism Father   . Hypertension Sister      Review of Systems  Constitutional: Negative.   HENT: Negative.   Eyes: Negative.    Respiratory: Positive for shortness of breath (with exertion).   Cardiovascular: Negative.   Gastrointestinal: Positive for heartburn.       IBS  Genitourinary: Negative.   Musculoskeletal: Positive for joint pain.  Skin: Negative.   Neurological: Positive for headaches.  Endo/Heme/Allergies: Positive for environmental allergies.  Psychiatric/Behavioral: Negative.      Objective:   Physical Exam  Constitutional: She is well-developed, well-nourished, and in no distress.  Eyes: Pupils are equal, round, and reactive to light.  Neck: Neck supple. No JVD present. No tracheal deviation present. No thyromegaly present.  Cardiovascular: Normal rate, regular rhythm and intact distal pulses.   Pulmonary/Chest: Effort normal and breath sounds normal. No respiratory distress. She has no wheezes.  Abdominal: Soft. There is no tenderness. There is no guarding.  Musculoskeletal:       Right knee: She exhibits decreased range of motion, swelling and bony tenderness. She exhibits no ecchymosis, no deformity, no laceration and no erythema. Tenderness found. Medial joint line tenderness noted. No lateral joint line tenderness noted.  Lymphadenopathy:    She has no cervical adenopathy.  Neurological: She is alert.  Skin: Skin is warm and dry.       Labs:  Estimated body mass index is 31.96 kg/m as calculated from the following:   Height as of 03/26/17: 5' 6.5" (1.689 m).   Weight as of 03/26/17: 91.2 kg (201 lb).   Imaging Review Plain radiographs demonstrate severe degenerative joint disease of the right knee(s) medial compartment.  The bone quality appears to be good for age and reported activity level.  Assessment/Plan:  End stage arthritis, right knee medial compartment  The patient history, physical examination, clinical judgment of the provider and imaging studies are consistent with end stage degenerative joint disease of the right knee(s) and medial unicompartmental knee  arthroplasty is deemed medically necessary. The treatment options including medical management, injection therapy arthroscopy and arthroplasty were discussed at length. The risks and benefits of total knee arthroplasty were presented and reviewed. The risks due to aseptic loosening, infection, stiffness, patella tracking problems, thromboembolic complications and other imponderables were discussed. The patient acknowledged the explanation, agreed to proceed with the plan and consent was signed. Patient is being admitted for outpatient / observation treatment for surgery, pain control, PT, OT, prophylactic antibiotics, VTE prophylaxis, progressive ambulation and ADL's and discharge planning. The patient is planning to be discharged home.    [place] Pugh Benen Weida   PA-C  03/28/2017, 1:23 PM

## 2017-04-01 ENCOUNTER — Ambulatory Visit (HOSPITAL_COMMUNITY): Payer: BLUE CROSS/BLUE SHIELD | Admitting: Certified Registered Nurse Anesthetist

## 2017-04-01 ENCOUNTER — Encounter (HOSPITAL_COMMUNITY): Payer: Self-pay

## 2017-04-01 ENCOUNTER — Observation Stay (HOSPITAL_COMMUNITY)
Admission: RE | Admit: 2017-04-01 | Discharge: 2017-04-02 | Disposition: A | Discharge status: Home / Self Care | Payer: BLUE CROSS/BLUE SHIELD | Source: Ambulatory Visit | Attending: Orthopedic Surgery | Admitting: Orthopedic Surgery

## 2017-04-01 ENCOUNTER — Encounter (HOSPITAL_COMMUNITY)
Admission: RE | Disposition: A | Discharge status: Home / Self Care | Payer: Self-pay | Source: Ambulatory Visit | Attending: Orthopedic Surgery

## 2017-04-01 DIAGNOSIS — N3281 Overactive bladder: Secondary | ICD-10-CM | POA: Diagnosis not present

## 2017-04-01 DIAGNOSIS — M1711 Unilateral primary osteoarthritis, right knee: Secondary | ICD-10-CM | POA: Diagnosis present

## 2017-04-01 DIAGNOSIS — I10 Essential (primary) hypertension: Secondary | ICD-10-CM | POA: Insufficient documentation

## 2017-04-01 DIAGNOSIS — Z9889 Other specified postprocedural states: Secondary | ICD-10-CM | POA: Diagnosis not present

## 2017-04-01 DIAGNOSIS — Z881 Allergy status to other antibiotic agents status: Secondary | ICD-10-CM | POA: Diagnosis not present

## 2017-04-01 DIAGNOSIS — Z8249 Family history of ischemic heart disease and other diseases of the circulatory system: Secondary | ICD-10-CM | POA: Insufficient documentation

## 2017-04-01 DIAGNOSIS — J309 Allergic rhinitis, unspecified: Secondary | ICD-10-CM | POA: Diagnosis not present

## 2017-04-01 DIAGNOSIS — Z96652 Presence of left artificial knee joint: Secondary | ICD-10-CM | POA: Insufficient documentation

## 2017-04-01 DIAGNOSIS — Z7951 Long term (current) use of inhaled steroids: Secondary | ICD-10-CM | POA: Diagnosis not present

## 2017-04-01 DIAGNOSIS — L719 Rosacea, unspecified: Secondary | ICD-10-CM | POA: Diagnosis not present

## 2017-04-01 DIAGNOSIS — E669 Obesity, unspecified: Secondary | ICD-10-CM | POA: Insufficient documentation

## 2017-04-01 DIAGNOSIS — Z800 Family history of malignant neoplasm of digestive organs: Secondary | ICD-10-CM | POA: Insufficient documentation

## 2017-04-01 DIAGNOSIS — K219 Gastro-esophageal reflux disease without esophagitis: Secondary | ICD-10-CM | POA: Insufficient documentation

## 2017-04-01 DIAGNOSIS — Z6833 Body mass index (BMI) 33.0-33.9, adult: Secondary | ICD-10-CM | POA: Diagnosis not present

## 2017-04-01 DIAGNOSIS — Z803 Family history of malignant neoplasm of breast: Secondary | ICD-10-CM | POA: Diagnosis not present

## 2017-04-01 DIAGNOSIS — Z79899 Other long term (current) drug therapy: Secondary | ICD-10-CM | POA: Insufficient documentation

## 2017-04-01 DIAGNOSIS — Z96651 Presence of right artificial knee joint: Secondary | ICD-10-CM

## 2017-04-01 DIAGNOSIS — Z90710 Acquired absence of both cervix and uterus: Secondary | ICD-10-CM | POA: Diagnosis not present

## 2017-04-01 DIAGNOSIS — D649 Anemia, unspecified: Secondary | ICD-10-CM | POA: Insufficient documentation

## 2017-04-01 DIAGNOSIS — Z833 Family history of diabetes mellitus: Secondary | ICD-10-CM | POA: Insufficient documentation

## 2017-04-01 DIAGNOSIS — E890 Postprocedural hypothyroidism: Secondary | ICD-10-CM | POA: Diagnosis not present

## 2017-04-01 DIAGNOSIS — Z86010 Personal history of colonic polyps: Secondary | ICD-10-CM | POA: Diagnosis not present

## 2017-04-01 HISTORY — PX: PARTIAL KNEE ARTHROPLASTY: SHX2174

## 2017-04-01 LAB — TYPE AND SCREEN
ABO/RH(D): A POS
ANTIBODY SCREEN: NEGATIVE

## 2017-04-01 SURGERY — ARTHROPLASTY, KNEE, UNICOMPARTMENTAL
Anesthesia: Spinal | Site: Knee | Laterality: Right

## 2017-04-01 MED ORDER — FENTANYL CITRATE (PF) 100 MCG/2ML IJ SOLN
INTRAMUSCULAR | Status: AC
  Administered 2017-04-01: 100 ug via INTRAVENOUS
  Filled 2017-04-01: qty 2

## 2017-04-01 MED ORDER — EPHEDRINE 5 MG/ML INJ
INTRAVENOUS | Status: AC
  Filled 2017-04-01: qty 10

## 2017-04-01 MED ORDER — VALACYCLOVIR HCL 500 MG PO TABS: 1000.0000 mg | ORAL_TABLET | Freq: Two times a day (BID) | ORAL | Status: DC | PRN

## 2017-04-01 MED ORDER — LIDOCAINE 2% (20 MG/ML) 5 ML SYRINGE
INTRAMUSCULAR | Status: AC
  Filled 2017-04-01: qty 5

## 2017-04-01 MED ORDER — ASPIRIN 81 MG PO CHEW: 81.0000 mg | CHEWABLE_TABLET | Freq: Two times a day (BID) | ORAL | 0 refills | Status: AC

## 2017-04-01 MED ORDER — POLYETHYLENE GLYCOL 3350 17 G PO PACK
17.0000 g | PACK | Freq: Two times a day (BID) | ORAL | 0 refills | Status: DC
Start: 2017-04-01 — End: 2017-04-24

## 2017-04-01 MED ORDER — ONDANSETRON HCL 4 MG/2ML IJ SOLN
INTRAMUSCULAR | Status: DC | PRN
  Administered 2017-04-01: 4 mg via INTRAVENOUS

## 2017-04-01 MED ORDER — 0.9 % SODIUM CHLORIDE (POUR BTL) OPTIME
TOPICAL | Status: DC | PRN
  Administered 2017-04-01: 1000 mL

## 2017-04-01 MED ORDER — MIDAZOLAM HCL 5 MG/ML IJ SOLN
2.0000 mg | Freq: Once | INTRAMUSCULAR | Status: AC
  Administered 2017-04-01: 1 mg via INTRAVENOUS

## 2017-04-01 MED ORDER — HYDROCODONE-ACETAMINOPHEN 7.5-325 MG PO TABS
1.0000 | ORAL_TABLET | ORAL | Status: DC
  Administered 2017-04-01: 1 via ORAL
  Administered 2017-04-02 (×2): 2 via ORAL
  Administered 2017-04-02 (×2): 1 via ORAL
  Filled 2017-04-01: qty 2
  Filled 2017-04-01 (×3): qty 1
  Filled 2017-04-01: qty 2

## 2017-04-01 MED ORDER — BUPIVACAINE-EPINEPHRINE 0.25% -1:200000 IJ SOLN
INTRAMUSCULAR | Status: DC | PRN
  Administered 2017-04-01: 30 mL

## 2017-04-01 MED ORDER — LEVOTHYROXINE SODIUM 25 MCG PO TABS
175.0000 ug | ORAL_TABLET | Freq: Every day | ORAL | Status: DC
  Administered 2017-04-01: 175 ug via ORAL
  Filled 2017-04-01: qty 1
  Filled 2017-04-01: qty 2

## 2017-04-01 MED ORDER — DEXAMETHASONE SODIUM PHOSPHATE 10 MG/ML IJ SOLN
10.0000 mg | Freq: Once | INTRAMUSCULAR | Status: AC
  Administered 2017-04-01: 10 mg via INTRAVENOUS

## 2017-04-01 MED ORDER — HYDROMORPHONE HCL-NACL 0.5-0.9 MG/ML-% IV SOSY
PREFILLED_SYRINGE | INTRAVENOUS | Status: AC
Start: 2017-04-01 — End: 2017-04-01
  Administered 2017-04-01: 0.5 mg via INTRAVENOUS
  Filled 2017-04-01: qty 2

## 2017-04-01 MED ORDER — MENTHOL 3 MG MT LOZG: 1.0000 | LOZENGE | OROMUCOSAL | Status: DC | PRN

## 2017-04-01 MED ORDER — HYDROMORPHONE HCL-NACL 0.5-0.9 MG/ML-% IV SOSY
PREFILLED_SYRINGE | INTRAVENOUS | Status: AC
  Administered 2017-04-01: 0.5 mg via INTRAVENOUS
  Filled 2017-04-01: qty 2

## 2017-04-01 MED ORDER — FENTANYL CITRATE (PF) 100 MCG/2ML IJ SOLN
25.0000 ug | INTRAMUSCULAR | Status: DC | PRN
  Administered 2017-04-01: 25 ug via INTRAVENOUS
  Filled 2017-04-01: qty 2

## 2017-04-01 MED ORDER — KETOROLAC TROMETHAMINE 30 MG/ML IJ SOLN
INTRAMUSCULAR | Status: DC | PRN
  Administered 2017-04-01: 30 mg

## 2017-04-01 MED ORDER — DOCUSATE SODIUM 100 MG PO CAPS
100.0000 mg | ORAL_CAPSULE | Freq: Two times a day (BID) | ORAL | Status: DC
  Administered 2017-04-01 – 2017-04-02 (×2): 100 mg via ORAL
  Filled 2017-04-01 (×2): qty 1

## 2017-04-01 MED ORDER — SODIUM CHLORIDE 0.9 % IJ SOLN
INTRAMUSCULAR | Status: DC | PRN
  Administered 2017-04-01: 30 mL

## 2017-04-01 MED ORDER — FERROUS SULFATE 325 (65 FE) MG PO TABS
325.0000 mg | ORAL_TABLET | Freq: Three times a day (TID) | ORAL | Status: DC
  Administered 2017-04-02 (×2): 325 mg via ORAL
  Filled 2017-04-01 (×2): qty 1

## 2017-04-01 MED ORDER — CHLORHEXIDINE GLUCONATE 4 % EX LIQD: 60.0000 mL | Freq: Once | CUTANEOUS | Status: DC

## 2017-04-01 MED ORDER — PHENYLEPHRINE HCL 10 MG/ML IJ SOLN
INTRAMUSCULAR | Status: DC | PRN
  Administered 2017-04-01: 40 ug via INTRAVENOUS
  Administered 2017-04-01 (×2): 80 ug via INTRAVENOUS
  Administered 2017-04-01: 40 ug via INTRAVENOUS

## 2017-04-01 MED ORDER — METHOCARBAMOL 500 MG PO TABS: 500.0000 mg | ORAL_TABLET | Freq: Four times a day (QID) | ORAL | Status: DC | PRN

## 2017-04-01 MED ORDER — VANCOMYCIN HCL IN DEXTROSE 1-5 GM/200ML-% IV SOLN
INTRAVENOUS | Status: AC
  Administered 2017-04-01: 1000 mg via INTRAVENOUS
  Filled 2017-04-01: qty 200

## 2017-04-01 MED ORDER — OXYCODONE HCL 5 MG PO TABS
5.0000 mg | ORAL_TABLET | Freq: Once | ORAL | Status: AC | PRN
  Administered 2017-04-01: 5 mg via ORAL

## 2017-04-01 MED ORDER — METHOCARBAMOL 1000 MG/10ML IJ SOLN
500.0000 mg | Freq: Four times a day (QID) | INTRAVENOUS | Status: DC | PRN
  Administered 2017-04-01: 500 mg via INTRAVENOUS
  Filled 2017-04-01: qty 550

## 2017-04-01 MED ORDER — PANTOPRAZOLE SODIUM 20 MG PO TBEC
20.0000 mg | DELAYED_RELEASE_TABLET | Freq: Every day | ORAL | Status: DC
  Administered 2017-04-02: 20 mg via ORAL
  Filled 2017-04-01: qty 1

## 2017-04-01 MED ORDER — METOCLOPRAMIDE HCL 5 MG/ML IJ SOLN
5.0000 mg | Freq: Three times a day (TID) | INTRAMUSCULAR | Status: DC | PRN
  Administered 2017-04-01: 10 mg via INTRAVENOUS
  Filled 2017-04-01: qty 2

## 2017-04-01 MED ORDER — SODIUM CHLORIDE 0.9 % IV SOLN
INTRAVENOUS | Status: DC
  Administered 2017-04-01: 18:00:00 via INTRAVENOUS
  Filled 2017-04-01 (×4): qty 1000

## 2017-04-01 MED ORDER — MIDAZOLAM HCL 5 MG/5ML IJ SOLN
INTRAMUSCULAR | Status: DC | PRN
  Administered 2017-04-01: 2 mg via INTRAVENOUS

## 2017-04-01 MED ORDER — BUPIVACAINE-EPINEPHRINE (PF) 0.25% -1:200000 IJ SOLN
INTRAMUSCULAR | Status: AC
  Filled 2017-04-01: qty 30

## 2017-04-01 MED ORDER — CELECOXIB 200 MG PO CAPS
200.0000 mg | ORAL_CAPSULE | Freq: Two times a day (BID) | ORAL | Status: DC
  Administered 2017-04-02: 200 mg via ORAL
  Filled 2017-04-01 (×2): qty 1

## 2017-04-01 MED ORDER — ONDANSETRON HCL 4 MG/2ML IJ SOLN
INTRAMUSCULAR | Status: AC
  Filled 2017-04-01: qty 2

## 2017-04-01 MED ORDER — ASPIRIN 81 MG PO CHEW
81.0000 mg | CHEWABLE_TABLET | Freq: Two times a day (BID) | ORAL | Status: DC
  Administered 2017-04-01 – 2017-04-02 (×2): 81 mg via ORAL
  Filled 2017-04-01 (×2): qty 1

## 2017-04-01 MED ORDER — LACTATED RINGERS IV SOLN
INTRAVENOUS | Status: DC
  Administered 2017-04-01: 1000 mL via INTRAVENOUS
  Administered 2017-04-01 (×2): via INTRAVENOUS

## 2017-04-01 MED ORDER — BUPIVACAINE IN DEXTROSE 0.75-8.25 % IT SOLN
INTRATHECAL | Status: DC | PRN
  Administered 2017-04-01: 1.8 mL via INTRATHECAL

## 2017-04-01 MED ORDER — ALUM & MAG HYDROXIDE-SIMETH 200-200-20 MG/5ML PO SUSP: 30.0000 mL | ORAL | Status: DC | PRN

## 2017-04-01 MED ORDER — DEXAMETHASONE SODIUM PHOSPHATE 10 MG/ML IJ SOLN
INTRAMUSCULAR | Status: AC
  Filled 2017-04-01: qty 1

## 2017-04-01 MED ORDER — PROPOFOL 10 MG/ML IV BOLUS
INTRAVENOUS | Status: AC
  Filled 2017-04-01: qty 20

## 2017-04-01 MED ORDER — ONDANSETRON HCL 4 MG PO TABS: 4.0000 mg | ORAL_TABLET | Freq: Four times a day (QID) | ORAL | Status: DC | PRN

## 2017-04-01 MED ORDER — DOCUSATE SODIUM 100 MG PO CAPS: 100.0000 mg | ORAL_CAPSULE | Freq: Two times a day (BID) | ORAL | 0 refills | Status: DC

## 2017-04-01 MED ORDER — FERROUS SULFATE 325 (65 FE) MG PO TABS: 325.0000 mg | ORAL_TABLET | Freq: Three times a day (TID) | ORAL | Status: DC

## 2017-04-01 MED ORDER — LIDOCAINE HCL (CARDIAC) 20 MG/ML IV SOLN
INTRAVENOUS | Status: DC | PRN
  Administered 2017-04-01: 50 mg via INTRAVENOUS

## 2017-04-01 MED ORDER — CHLORHEXIDINE GLUCONATE CLOTH 2 % EX PADS: 6.0000 | MEDICATED_PAD | Freq: Every day | CUTANEOUS | Status: DC

## 2017-04-01 MED ORDER — IRBESARTAN 300 MG PO TABS
300.0000 mg | ORAL_TABLET | Freq: Every day | ORAL | Status: DC
Start: 2017-04-01 — End: 2017-04-02
  Administered 2017-04-02: 300 mg via ORAL
  Filled 2017-04-01 (×2): qty 1
  Filled 2017-04-01: qty 2

## 2017-04-01 MED ORDER — PROPOFOL 10 MG/ML IV BOLUS
INTRAVENOUS | Status: AC
  Filled 2017-04-01: qty 40

## 2017-04-01 MED ORDER — HYDROCODONE-ACETAMINOPHEN 7.5-325 MG PO TABS: 1.0000 | ORAL_TABLET | ORAL | 0 refills | Status: DC | PRN

## 2017-04-01 MED ORDER — LORAZEPAM 0.5 MG PO TABS: 0.5000 mg | ORAL_TABLET | Freq: Two times a day (BID) | ORAL | Status: DC | PRN

## 2017-04-01 MED ORDER — OXYCODONE HCL 5 MG PO TABS
ORAL_TABLET | ORAL | Status: AC
  Filled 2017-04-01: qty 1

## 2017-04-01 MED ORDER — DIPHENHYDRAMINE HCL 25 MG PO CAPS: 25.0000 mg | ORAL_CAPSULE | Freq: Four times a day (QID) | ORAL | Status: DC | PRN

## 2017-04-01 MED ORDER — FLUTICASONE PROPIONATE 50 MCG/ACT NA SUSP
2.0000 | Freq: Every day | NASAL | Status: DC | PRN
  Filled 2017-04-01: qty 16

## 2017-04-01 MED ORDER — TRANEXAMIC ACID 1000 MG/10ML IV SOLN
1000.0000 mg | INTRAVENOUS | Status: AC
  Administered 2017-04-01: 1000 mg via INTRAVENOUS
  Filled 2017-04-01: qty 1100

## 2017-04-01 MED ORDER — PROPOFOL 500 MG/50ML IV EMUL
INTRAVENOUS | Status: DC | PRN
  Administered 2017-04-01: 100 ug/kg/min via INTRAVENOUS

## 2017-04-01 MED ORDER — MIDAZOLAM HCL 2 MG/2ML IJ SOLN
INTRAMUSCULAR | Status: AC
  Filled 2017-04-01: qty 2

## 2017-04-01 MED ORDER — PHENYLEPHRINE 40 MCG/ML (10ML) SYRINGE FOR IV PUSH (FOR BLOOD PRESSURE SUPPORT)
PREFILLED_SYRINGE | INTRAVENOUS | Status: AC
  Filled 2017-04-01: qty 10

## 2017-04-01 MED ORDER — KETOROLAC TROMETHAMINE 30 MG/ML IJ SOLN
INTRAMUSCULAR | Status: AC
  Filled 2017-04-01: qty 1

## 2017-04-01 MED ORDER — METHOCARBAMOL 500 MG PO TABS: 500.0000 mg | ORAL_TABLET | Freq: Four times a day (QID) | ORAL | 0 refills | Status: DC | PRN

## 2017-04-01 MED ORDER — FENTANYL CITRATE (PF) 100 MCG/2ML IJ SOLN
100.0000 ug | Freq: Once | INTRAMUSCULAR | Status: AC
  Administered 2017-04-01: 100 ug via INTRAVENOUS

## 2017-04-01 MED ORDER — METOCLOPRAMIDE HCL 5 MG PO TABS: 5.0000 mg | ORAL_TABLET | Freq: Three times a day (TID) | ORAL | Status: DC | PRN

## 2017-04-01 MED ORDER — POLYETHYLENE GLYCOL 3350 17 G PO PACK
17.0000 g | PACK | Freq: Two times a day (BID) | ORAL | Status: DC
  Administered 2017-04-01 – 2017-04-02 (×2): 17 g via ORAL
  Filled 2017-04-01 (×2): qty 1

## 2017-04-01 MED ORDER — DEXAMETHASONE SODIUM PHOSPHATE 10 MG/ML IJ SOLN
10.0000 mg | Freq: Once | INTRAMUSCULAR | Status: AC
  Administered 2017-04-02: 10 mg via INTRAVENOUS
  Filled 2017-04-01: qty 1

## 2017-04-01 MED ORDER — HYOSCYAMINE SULFATE 0.125 MG SL SUBL: 0.1250 mg | SUBLINGUAL_TABLET | Freq: Three times a day (TID) | SUBLINGUAL | Status: DC | PRN

## 2017-04-01 MED ORDER — PHENOL 1.4 % MT LIQD: 1.0000 | OROMUCOSAL | Status: DC | PRN

## 2017-04-01 MED ORDER — ROPIVACAINE HCL 5 MG/ML IJ SOLN
INTRAMUSCULAR | Status: DC | PRN
  Administered 2017-04-01: 30 mL via PERINEURAL

## 2017-04-01 MED ORDER — ONDANSETRON HCL 4 MG/2ML IJ SOLN
4.0000 mg | Freq: Four times a day (QID) | INTRAMUSCULAR | Status: DC | PRN
  Administered 2017-04-01: 4 mg via INTRAVENOUS
  Filled 2017-04-01: qty 2

## 2017-04-01 MED ORDER — SODIUM CHLORIDE 0.9 % IJ SOLN
INTRAMUSCULAR | Status: AC
  Filled 2017-04-01: qty 50

## 2017-04-01 MED ORDER — PROMETHAZINE HCL 25 MG/ML IJ SOLN
12.5000 mg | Freq: Four times a day (QID) | INTRAMUSCULAR | Status: DC | PRN
  Administered 2017-04-01: 12.5 mg via INTRAVENOUS
  Filled 2017-04-01: qty 1

## 2017-04-01 MED ORDER — EPHEDRINE SULFATE 50 MG/ML IJ SOLN
INTRAMUSCULAR | Status: DC | PRN
  Administered 2017-04-01 (×3): 5 mg via INTRAVENOUS

## 2017-04-01 MED ORDER — BISACODYL 10 MG RE SUPP: 10.0000 mg | Freq: Every day | RECTAL | Status: DC | PRN

## 2017-04-01 MED ORDER — VANCOMYCIN HCL IN DEXTROSE 1-5 GM/200ML-% IV SOLN
1000.0000 mg | INTRAVENOUS | Status: AC
  Administered 2017-04-01: 1000 mg via INTRAVENOUS

## 2017-04-01 MED ORDER — MUPIROCIN 2 % EX OINT
1.0000 "application " | TOPICAL_OINTMENT | Freq: Two times a day (BID) | CUTANEOUS | Status: DC
  Administered 2017-04-02: 1 via NASAL
  Filled 2017-04-01: qty 22

## 2017-04-01 MED ORDER — VANCOMYCIN HCL IN DEXTROSE 1-5 GM/200ML-% IV SOLN
1000.0000 mg | Freq: Two times a day (BID) | INTRAVENOUS | Status: AC
  Administered 2017-04-01: 1000 mg via INTRAVENOUS
  Filled 2017-04-01: qty 200

## 2017-04-01 MED ORDER — MAGNESIUM CITRATE PO SOLN: 1.0000 | Freq: Once | ORAL | Status: DC | PRN

## 2017-04-01 MED ORDER — HYDROMORPHONE HCL-NACL 0.5-0.9 MG/ML-% IV SOSY
0.2500 mg | PREFILLED_SYRINGE | INTRAVENOUS | Status: DC | PRN
  Administered 2017-04-01 (×4): 0.5 mg via INTRAVENOUS

## 2017-04-01 MED ORDER — PROMETHAZINE HCL 25 MG/ML IJ SOLN: 6.2500 mg | INTRAMUSCULAR | Status: DC | PRN

## 2017-04-01 MED ORDER — ZOLPIDEM TARTRATE 5 MG PO TABS: 2.5000 mg | ORAL_TABLET | Freq: Every evening | ORAL | Status: DC | PRN

## 2017-04-01 SURGICAL SUPPLY — 42 items
ADH SKN CLS APL DERMABOND .7 (GAUZE/BANDAGES/DRESSINGS) ×1
BAG DECANTER FOR FLEXI CONT (MISCELLANEOUS) IMPLANT
BAG SPEC THK2 15X12 ZIP CLS (MISCELLANEOUS)
BAG ZIPLOCK 12X15 (MISCELLANEOUS) IMPLANT
BANDAGE ACE 6X5 VEL STRL LF (GAUZE/BANDAGES/DRESSINGS) ×2 IMPLANT
BLADE SAW RECIPROCATING 77.5 (BLADE) ×2 IMPLANT
BLADE SAW SGTL 13.0X1.19X90.0M (BLADE) ×2 IMPLANT
BOWL SMART MIX CTS (DISPOSABLE) ×2 IMPLANT
CAPT KNEE PARTIAL 2 ×1 IMPLANT
CEMENT HV SMART SET (Cement) ×1 IMPLANT
CLOTH BEACON ORANGE TIMEOUT ST (SAFETY) ×2 IMPLANT
COVER SURGICAL LIGHT HANDLE (MISCELLANEOUS) ×2 IMPLANT
CUFF TOURN SGL QUICK 34 (TOURNIQUET CUFF) ×2
CUFF TRNQT CYL 34X4X40X1 (TOURNIQUET CUFF) ×1 IMPLANT
DERMABOND ADVANCED (GAUZE/BANDAGES/DRESSINGS) ×1
DERMABOND ADVANCED .7 DNX12 (GAUZE/BANDAGES/DRESSINGS) ×1 IMPLANT
DRAPE U-SHAPE 47X51 STRL (DRAPES) ×2 IMPLANT
DRESSING AQUACEL AG SP 3.5X10 (GAUZE/BANDAGES/DRESSINGS) ×1 IMPLANT
DRSG AQUACEL AG SP 3.5X10 (GAUZE/BANDAGES/DRESSINGS) ×2
DURAPREP 26ML APPLICATOR (WOUND CARE) ×4 IMPLANT
ELECT REM PT RETURN 15FT ADLT (MISCELLANEOUS) ×2 IMPLANT
GLOVE BIOGEL M 7.0 STRL (GLOVE) IMPLANT
GLOVE BIOGEL PI IND STRL 7.5 (GLOVE) ×1 IMPLANT
GLOVE BIOGEL PI IND STRL 8.5 (GLOVE) ×1 IMPLANT
GLOVE BIOGEL PI INDICATOR 7.5 (GLOVE) ×1
GLOVE BIOGEL PI INDICATOR 8.5 (GLOVE) ×1
GLOVE ECLIPSE 8.0 STRL XLNG CF (GLOVE) ×2 IMPLANT
GLOVE ORTHO TXT STRL SZ7.5 (GLOVE) ×4 IMPLANT
GOWN STRL REUS W/TWL LRG LVL3 (GOWN DISPOSABLE) ×2 IMPLANT
GOWN STRL REUS W/TWL XL LVL3 (GOWN DISPOSABLE) ×2 IMPLANT
LEGGING LITHOTOMY PAIR STRL (DRAPES) ×2 IMPLANT
MANIFOLD NEPTUNE II (INSTRUMENTS) ×2 IMPLANT
PACK TOTAL KNEE CUSTOM (KITS) ×2 IMPLANT
SUT MNCRL AB 4-0 PS2 18 (SUTURE) ×2 IMPLANT
SUT STRATAFIX 0 PDS 27 VIOLET (SUTURE) ×2
SUT VIC AB 1 CT1 36 (SUTURE) ×2 IMPLANT
SUT VIC AB 2-0 CT1 27 (SUTURE) ×4
SUT VIC AB 2-0 CT1 TAPERPNT 27 (SUTURE) ×2 IMPLANT
SUTURE STRATFX 0 PDS 27 VIOLET (SUTURE) ×1 IMPLANT
SYR 50ML LL SCALE MARK (SYRINGE) ×1 IMPLANT
TRAY FOLEY CATH 14FRSI W/METER (CATHETERS) ×1 IMPLANT
TRAY FOLEY W/METER SILVER 16FR (SET/KITS/TRAYS/PACK) IMPLANT

## 2017-04-01 NOTE — Anesthesia Procedure Notes (Signed)
Anesthesia Procedure Image    

## 2017-04-01 NOTE — Discharge Instructions (Signed)

## 2017-04-01 NOTE — Progress Notes (Signed)
Assisted Dr. Rose with right, ultrasound guided, adductor canal block. Side rails up, monitors on throughout procedure. See vital signs in flow sheet. Tolerated Procedure well.  

## 2017-04-01 NOTE — Anesthesia Preprocedure Evaluation (Signed)
Anesthesia Evaluation  Patient identified by MRN, date of birth, ID band Patient awake    Reviewed: Allergy & Precautions, NPO status , Patient's Chart, lab work & pertinent test results  Airway Mallampati: II  TM Distance: >3 FB Neck ROM: Full    Dental no notable dental hx.    Pulmonary neg pulmonary ROS,    Pulmonary exam normal breath sounds clear to auscultation       Cardiovascular hypertension, Normal cardiovascular exam Rhythm:Regular Rate:Normal     Neuro/Psych negative neurological ROS  negative psych ROS   GI/Hepatic Neg liver ROS, GERD  Medicated,  Endo/Other  Hypothyroidism   Renal/GU negative Renal ROS  negative genitourinary   Musculoskeletal negative musculoskeletal ROS (+)   Abdominal   Peds negative pediatric ROS (+)  Hematology negative hematology ROS (+)   Anesthesia Other Findings   Reproductive/Obstetrics negative OB ROS                             Anesthesia Physical Anesthesia Plan  ASA: II  Anesthesia Plan: Spinal   Post-op Pain Management:  [facility] for Post-op pain   Induction: Intravenous  PONV Risk Score and Plan: 1 and Ondansetron, Dexamethasone and Propofol  Airway Management Planned: Simple Face Mask  Additional Equipment:   Intra-op Plan:   Post-operative Plan:   Informed Consent: I have reviewed the patients History and Physical, chart, labs and discussed the procedure including the risks, benefits and alternatives for the proposed anesthesia with the patient or authorized representative who has indicated his/her understanding and acceptance.   Dental advisory given  Plan Discussed with: CRNA and Surgeon  Anesthesia Plan Comments:         Anesthesia Quick Evaluation

## 2017-04-01 NOTE — Addendum Note (Signed)
Addendum  created 04/01/17 1300 by Lollie Sails, CRNA   Anesthesia Intra Meds edited

## 2017-04-01 NOTE — Anesthesia Procedure Notes (Signed)
Spinal  Patient location during procedure: OR Start time: 04/01/2017 10:10 AM End time: 04/01/2017 10:17 AM Staffing Anesthesiologist: Teodora Baumgarten, Iona Beard Performed: anesthesiologist  Preanesthetic Checklist Completed: patient identified, site marked, surgical consent, pre-op evaluation, timeout performed, IV checked, risks and benefits discussed and monitors and equipment checked Spinal Block Patient position: sitting Prep: Betadine Patient monitoring: heart rate, continuous pulse ox and blood pressure Injection technique: single-shot Needle Needle type: Sprotte  Needle gauge: 24 G Needle length: 9 cm Additional Notes Expiration date of kit checked and confirmed. Patient tolerated procedure well, without complications.

## 2017-04-01 NOTE — Anesthesia Postprocedure Evaluation (Signed)
Anesthesia Post Note  Patient: [name]  Procedure(s) Performed: Procedure(s) (LRB): UNICOMPARTMENTAL RIGHT KNEE- Medially (Right)     Patient location during evaluation: PACU Anesthesia Type: Spinal Level of consciousness: oriented and awake and alert Pain management: pain level controlled Vital Signs Assessment: post-procedure vital signs reviewed and stable Respiratory status: spontaneous breathing, respiratory function stable and patient connected to nasal cannula oxygen Cardiovascular status: blood pressure returned to baseline and stable Postop Assessment: no headache and no backache Anesthetic complications: no    Last Vitals:  Vitals:   04/01/17 1230 04/01/17 1245  BP: 132/74   Pulse: 71 71  Resp: 12 14  Temp:  (!) 36.4 C    Last Pain:  Vitals:   04/01/17 1245  TempSrc:   PainSc: 1     LLE Motor Response: Purposeful movement (04/01/17 1245) LLE Sensation: Numbness;Tingling (04/01/17 1245) RLE Motor Response: Purposeful movement (04/01/17 1245) RLE Sensation: Numbness;Tingling (04/01/17 1245) L Sensory Level: S1-Sole of foot, small toes (04/01/17 1245) R Sensory Level: S1-Sole of foot, small toes (04/01/17 1245)  [name] S

## 2017-04-01 NOTE — Op Note (Addendum)
NAME: [name]    MEDICAL RECORD NO.: [id number]   FACILITY: [facility] BIRTH: 1955-12-09  PHYSICIAN: Pietro Cassis. Alvan Dame, M.D.    DATE OF PROCEDURE: 04/01/2017    OPERATIVE REPORT   PREOPERATIVE DIAGNOSIS: Right knee medial compartment osteoarthritis.   POSTOPERATIVE DIAGNOSIS: Right knee medial compartment osteoarthritis.  PROCEDURE: Right partial knee replacement utilizing Biomet Oxford knee  component, size small femur, a right medial size A tibial tray with a size 4 insert.   SURGEON: Pietro Cassis. Alvan Dame, M.D.   ASSISTANT: Danae Orleans, PAC.  Please note that Mr. [name] was present for the entirety of the case,  utilized for preoperative positioning, perioperative retractor  management, general facilitation of the case and primary wound closure.   ANESTHESIA: Regional and spinal.   SPECIMENS: None.   COMPLICATIONS: None.  DRAINS: None   TOURNIQUET TIME: 30 minutes at 250 mmHg.   INDICATIONS FOR PROCEDURE: The patient is a 61 y.o. patient of mine who presented for evaluation of right knee pain.  They presented with primary complaints of pain on the medial side of their knee. Radiographs revealed advanced medial compartment arthritis with specifically an antero-medial wear pattern.  There was bone on bone changes noted with subchondral sclerosis and osteophytes present. The patient has had progressive problems failing to respond to conservative measures of medications, injections and activity modification. Risks of infection, DVT, component failure, need for future revision surgery were all discussed and reviewed.  Consent was obtained for benefit of pain relief.   PROCEDURE IN DETAIL: The patient was brought to the operative theater.  Once adequate anesthesia, preoperative antibiotics, 1 gm of Vancomycin, 1 gm of Tranexamic Acid, and 10 mg of Decadron administered, the patient was positioned in supine position with a right thigh tourniquet  placed. The right lower  extremity was prepped and draped in sterile  fashion with the leg on the Oxford leg holder.  The leg was allowed to flex to 120 degrees. A time-out  was performed identifying the patient, planned procedure, and extremity.  The leg was exsanguinated, tourniquet elevated to 250 mmHg. A midline  incision was made from the proximal pole of the patella to the tibial tubercle. A  soft tissue plane was created and partial median arthrotomy was then  made to allow for subluxation of the patella. Following initial synovectomy and  debridement, the osteophytes were removed off the medial aspect of the  knee.   Attention was first directed to the tibia. I sized the femur to be best fit with the small spoon.  With the small spoon in place the tibial  extramedullary guide was positioned over the anterior crest of the tibia  and pinned into position, and using a measured resection guide from the  [employer]man system, a 4 mm resection was made off the proximal tibia. First  the reciprocating saw along the medial aspect of the tibial spines, then the oscillating saw.    At this point, I sized this cut surface seem to be best fit for a size A tibial tray.  With the retractors out of the wound and the knee held at 90 degrees the 4 feeler gauge had appropriate tension on the medial ligament.   At this point, the femoral canal was opened with a drill and the  intramedullary rod passed. Then using the guide for a small resection off  the posterior aspect of the femur was positioned over the mid portion of the medial femoral condyle.  The  orientation was set using the guide that mates the femoral guide to the intramedullary rod.  The 2 drill holes were made into the distal femur.  The posterior cutting block was then impacted into place and the posterior  femoral cut made.  At this point, I milled the distal femur with a size 4 spigot in place. At this point, we did a trial reduction of the small femur, size right  medial size A tibial tray and a size 4 feeler gauget. At 90 degrees of  flexion and at 20 degrees of flexion the knee had symmetric tension on  the ligaments.   Given these findings, the trial femoral component was removed. Final preparation of tibia was carried out by pinning it in position. Then  using a reciprocating saw I removed bone for the keel. Further bone was  removed with an osteotome.  Trial reduction was now carried out with the small femur, the left medial A keeled tibia, and a size 4 lollipop insert. The balance of the  ligaments appeared to be symmetric at 20 degrees and 90 degrees. Given  all these findings, the trial components were removed.   Cement was mixed. The final components were opened. The knee was irrigated with  normal saline solution. Then final debridements of the  soft tissue was carried out, I also drilled the sclerotic bone with a drill.  The synovial capsular tissue was injected with a combination of 30 cc of .25% Marcaine with epi, 30 cc of NS plus 1 cc of Toradol.  The final components were cemented with a single batch of cement in a  two-stage technique with the tibial component cemented first. The knee  was then brought  to 45 degrees of flexion with a size 4 feeler gauge, held with pressure for a minute and half.  After this the femoral component was cemented in place.  The knee was again held at 45 degrees of flexion while the cement fully cured.  Excess cement was removed throughout the knee. Tourniquet was let down  after 30 minutes. After the cement had fully cured and excessive cement  was removed throughout the knee there was no visualized cement present.   The final right medial size 4 insert to match the small femur was chosen and snapped into position. We re-irrigated  the knee. The extensor mechanism  was then reapproximated using a #1 Vicryl and #0 V-lock sutures with the knee in flexion. The  remaining wound was closed with 2-0 Vicryl and  a running 4-0 Monocryl.  The knee was cleaned, dried, and dressed sterilely using Dermabond and  Aquacel dressing. The patient  was brought to the recovery room, Ace wrap in place, tolerating the  procedure well. She will be in the hospital for overnight observation.  We will initiate physical therapy and progress to ambulate.     Pietro Cassis Alvan Dame, M.D.

## 2017-04-01 NOTE — Anesthesia Procedure Notes (Signed)
Date/Time: 04/01/2017 10:20 AM Performed by: Claudia Desanctis Oxygen Delivery Method: Simple face mask

## 2017-04-01 NOTE — Anesthesia Procedure Notes (Signed)
Anesthesia Regional Block: Adductor canal block   Pre-Anesthetic Checklist: ,, timeout performed, Correct Patient, Correct Site, Correct Laterality, Correct Procedure, Correct Position, site marked, Risks and benefits discussed,  Surgical consent,  Pre-op evaluation,  At surgeon's request and post-op pain management  Laterality: Right  Prep: chloraprep       Needles:  Injection technique: Single-shot  Needle Type: Echogenic Needle     Needle Length: 9cm      Additional Needles:   Procedures: ultrasound guided,,,,,,,,  Narrative:  Start time: 04/01/2017 9:49 AM End time: 04/01/2017 9:49 AM Injection made incrementally with aspirations every 5 mL.  Performed by: Personally  Anesthesiologist: Mileidy Atkin  Additional Notes: Patient tolerated the procedure well without complications

## 2017-04-01 NOTE — Transfer of Care (Signed)
Immediate Anesthesia Transfer of Care Note  Patient: [name]  Procedure(s) Performed: Procedure(s) with comments: UNICOMPARTMENTAL RIGHT KNEE- Medially (Right) - 90 mins  Patient Location: PACU  Anesthesia Type:Spinal  Level of Consciousness:  sedated, patient cooperative and responds to stimulation  Airway & Oxygen Therapy:Patient Spontanous Breathing and Patient connected to face mask oxgen  Post-op Assessment:  Report given to PACU RN and Post -op Vital signs reviewed and stable  Post vital signs:  Reviewed and stable  Last Vitals:  Vitals:   04/01/17 0950 04/01/17 0951  BP:    Pulse: 72 73  Resp: (!) 42 (!) 24  Temp:      Complications: No apparent anesthesia complications

## 2017-04-01 NOTE — Interval H&P Note (Signed)
History and Physical Interval Note:  04/01/2017 9:11 AM  [name]  has presented today for surgery, with the diagnosis of Right knee osteoarthritis medially  The various methods of treatment have been discussed with the patient and family. After consideration of risks, benefits and other options for treatment, the patient has consented to  Procedure(s) with comments: UNICOMPARTMENTAL RIGHT KNEE- Medially (Right) - 90 mins as a surgical intervention .  The patient's history has been reviewed, patient examined, no change in status, stable for surgery.  I have reviewed the patient's chart and labs.  Questions were answered to the patient's satisfaction.     [name]

## 2017-04-02 DIAGNOSIS — M1711 Unilateral primary osteoarthritis, right knee: Secondary | ICD-10-CM | POA: Diagnosis not present

## 2017-04-02 LAB — BASIC METABOLIC PANEL
Anion gap: 7 (ref 5–15)
BUN: 20 mg/dL (ref 6–20)
CALCIUM: 8.7 mg/dL — AB (ref 8.9–10.3)
CHLORIDE: 112 mmol/L — AB (ref 101–111)
CO2: 24 mmol/L (ref 22–32)
CREATININE: 0.81 mg/dL (ref 0.44–1.00)
GFR calc non Af Amer: >60 mL/min (ref >60)
Glucose, Bld: 139 mg/dL — ABNORMAL HIGH (ref 65–99)
Potassium: 3.8 mmol/L (ref 3.5–5.1)
Sodium: 143 mmol/L (ref 135–145)

## 2017-04-02 LAB — CBC
HEMATOCRIT: 37.5 % (ref 36.0–46.0)
HEMOGLOBIN: 12.5 g/dL (ref 12.0–15.0)
MCH: 30 pg (ref 26.0–34.0)
MCHC: 33.3 g/dL (ref 30.0–36.0)
MCV: 90.1 fL (ref 78.0–100.0)
Platelets: 273 10*3/uL (ref 150–400)
RBC: 4.16 MIL/uL (ref 3.87–5.11)
RDW: 13.6 % (ref 11.5–15.5)
WBC: 11.5 10*3/uL — ABNORMAL HIGH (ref 4.0–10.5)

## 2017-04-02 MED ORDER — DIPHENHYDRAMINE HCL 25 MG PO CAPS: 25.0000 mg | ORAL_CAPSULE | Freq: Four times a day (QID) | ORAL | Status: DC | PRN

## 2017-04-02 NOTE — Progress Notes (Signed)
     Subjective: 1 Day Post-Op Procedure(s) (LRB): UNICOMPARTMENTAL RIGHT KNEE- Medially (Right)   Patient reports pain as mild,l pain controlled. No events throughout the night.  Discussed OPPT and a Rx is given to her   Objective:   VITALS:   Vitals:   04/02/17 0145 04/02/17 0646  BP: (!) 128/52 (!) 131/56  Pulse: 75 88  Resp: 16 16  Temp: 97.9 F (36.6 C) 98.6 F (37 C)    Dorsiflexion/Plantar flexion intact Incision: dressing C/D/I No cellulitis present Compartment soft  LABS  Recent Labs  04/02/17 0530  HGB 12.5  HCT 37.5  WBC 11.5*  PLT 273     Recent Labs  04/02/17 0530  NA 143  K 3.8  BUN 20  CREATININE 0.81  GLUCOSE 139*     Assessment/Plan: 1 Day Post-Op Procedure(s) (LRB): UNICOMPARTMENTAL RIGHT KNEE- Medially (Right) Foley cath d/c'ed Advance diet Up with therapy D/C IV fluids Discharge home Follow up in 2 weeks at [facility]. Follow up with OLIN,Pamela Maddy D in 2 weeks.  Contact information:  [facility] [address], [address] [place] Stainback   PAC  04/02/2017, 9:30 AM

## 2017-04-02 NOTE — Progress Notes (Signed)
Patient ID: [name], female   DOB: [birth year], 61 y.o.   MRN: [id number] Subjective: 1 Day Post-Op Procedure(s) (LRB): UNICOMPARTMENTAL RIGHT KNEE- Medially (Right)    Patient reports pain as mild.  Doing well, no events.  Reviewed intra-operative findings  Objective:   VITALS:   Vitals:   04/02/17 0646 04/02/17 1007  BP: (!) 131/56 137/66  Pulse: 88 69  Resp: 16 16  Temp: 98.6 F (37 C) 97.9 F (36.6 C)    Neurovascular intact Incision: dressing C/D/I  LABS  Recent Labs  04/02/17 0530  HGB 12.5  HCT 37.5  WBC 11.5*  PLT 273     Recent Labs  04/02/17 0530  NA 143  K 3.8  BUN 20  CREATININE 0.81  GLUCOSE 139*    No results for input(s): LABPT, INR in the last 72 hours.   Assessment/Plan: 1 Day Post-Op Procedure(s) (LRB): UNICOMPARTMENTAL RIGHT KNEE- Medially (Right)   Advance diet Up with therapy  Home today after therapy Reviewed goals Out pt PT at [facility] location - Rx provided RTC in 2 weeks

## 2017-04-02 NOTE — Evaluation (Signed)
Physical Therapy Evaluation Patient Details Name: [name] MRN: [id number] DOB: 07-07-1956 Today's Date: 04/02/2017   History of Present Illness  s/p R UKR  Clinical Impression  Pt admitted with above diagnosis. Pt currently with functional limitations due to the deficits listed below (see PT Problem List). * Pt will benefit from skilled PT to increase their independence and safety with mobility to allow discharge to the venue listed below.   Will see for second session for stair training    Follow Up Recommendations DC plan and follow up therapy as arranged by surgeon;Outpatient PT    Equipment Recommendations  None recommended by PT;3in1 (PT)    Recommendations for Other Services       Precautions / Restrictions Precautions Precautions: Fall Restrictions Weight Bearing Restrictions: No      Mobility  Bed Mobility Overal bed mobility: Modified Independent             General bed mobility comments: NT--pt in chair  Transfers Overall transfer level: Needs assistance Equipment used: Rolling walker (2 wheeled) Transfers: Sit to/from Stand Sit to Stand: Supervision;Min guard         General transfer comment: Cues for UE/LE placement  Ambulation/Gait Ambulation/Gait assistance: Min guard Ambulation Distance (Feet): 80 Feet Assistive device: Rolling walker (2 wheeled) Gait Pattern/deviations: Step-to pattern     General Gait Details: cues for sequence and RW position  Stairs            Wheelchair Mobility    Modified Rankin (Stroke Patients Only)       Balance                                             Pertinent Vitals/Pain Pain Assessment: 0-10 Pain Score: 2  Pain Location: R knee Pain Descriptors / Indicators: Sore Pain Intervention(s): Limited activity within patient's tolerance;Monitored during session;Premedicated before session;Repositioned;Ice applied    Home Living Family/patient expects to be discharged  to:: Private residence Living Arrangements: Spouse/significant other;Children Available Help at Discharge: Family Type of Home: House Home Access: Stairs to enter Entrance Stairs-Rails: Right Entrance Stairs-Number of Steps: 5 Home Layout: Two level;Able to live on main level with bedroom/bathroom Home Equipment: Gilford Rile - 2 wheels      Prior Function Level of Independence: Independent               Hand Dominance        Extremity/Trunk Assessment   Upper Extremity Assessment Upper Extremity Assessment: Defer to OT evaluation;Overall WFL for tasks assessed    Lower Extremity Assessment Lower Extremity Assessment: RLE deficits/detail RLE Deficits / Details: ankle WFL; knee AAROM grossly 5 to 65* flexion; knee extension and hip flexion 2+/5       Communication   Communication: No difficulties  Cognition Arousal/Alertness: Awake/alert Behavior During Therapy: WFL for tasks assessed/performed Overall Cognitive Status: Within Functional Limits for tasks assessed                                        General Comments      Exercises Total Joint Exercises Ankle Circles/Pumps: AROM;Both;10 reps Quad Sets: 10 reps;Both;AROM Towel Squeeze: AROM;Strengthening;10 reps;Both Short Arc Quad: AROM;Right;10 reps Heel Slides: AAROM;Right;10 reps Hip ABduction/ADduction: AROM;Strengthening;Right;10 reps Straight Leg Raises: AROM;Strengthening;Right;10 reps   Assessment/Plan  PT Assessment Patient needs continued PT services  PT Problem List Decreased strength;Decreased range of motion;Decreased activity tolerance;Decreased mobility;Pain;Decreased knowledge of use of DME       PT Treatment Interventions Functional mobility training;Gait training;DME instruction;Therapeutic activities;Therapeutic exercise;Patient/family education;Stair training    PT Goals (Current goals can be found in the Care Plan section)  Acute Rehab PT Goals Patient Stated Goal:  home; resume independence PT Goal Formulation: With patient Time For Goal Achievement: 04/09/17 Potential to Achieve Goals: Good    Frequency 7X/week   Barriers to discharge        Co-evaluation               AM-PAC PT "6 Clicks" Daily Activity  Outcome Measure Difficulty turning over in bed (including adjusting bedclothes, sheets and blankets)?: Total Difficulty moving from lying on back to sitting on the side of the bed? : Total Difficulty sitting down on and standing up from a chair with arms (e.g., wheelchair, bedside commode, etc,.)?: Total Help needed moving to and from a bed to chair (including a wheelchair)?: A Little Help needed walking in hospital room?: A Little Help needed climbing 3-5 steps with a railing? : A Little 6 Click Score: 12    End of Session Equipment Utilized During Treatment: Gait belt Activity Tolerance: Patient tolerated treatment well Patient left: in chair;with call bell/phone within reach   PT Visit Diagnosis: Difficulty in walking, not elsewhere classified (R26.2)    Time: 1610-9604 PT Time Calculation (min) (ACUTE ONLY): 35 min   Charges:   PT Evaluation $PT Eval Low Complexity: 1 Procedure PT Treatments $Therapeutic Exercise: 8-22 mins   PT G Codes:   PT G-Codes **NOT FOR INPATIENT CLASS** Functional Assessment Tool Used: AM-PAC 6 Clicks Basic Mobility Functional Limitation: Mobility: Walking and moving around Mobility: Walking and Moving Around Current Status (V4098): At least 1 percent but less than 20 percent impaired, limited or restricted Mobility: Walking and Moving Around Goal Status [id number]): At least 1 percent but less than 20 percent impaired, limited or restricted    Kenyon Ana, PT Pager: [phone] 04/02/2017   [facility] 04/02/2017, 11:48 AM

## 2017-04-02 NOTE — Progress Notes (Signed)
   04/02/17 1300  PT Visit Information  Last PT Received On 04/02/17  Assistance Needed +1  History of Present Illness s/p R UKR  Subjective Data  Patient Stated Goal home; resume independence  Restrictions  Weight Bearing Restrictions No  Other Position/Activity Restrictions WBAT  Pain Assessment  Pain Assessment 0-10  Pain Score 1  Pain Location R knee  Pain Descriptors / Indicators Sore  Pain Intervention(s) Limited activity within patient's tolerance;Monitored during session;Ice applied  Cognition  Arousal/Alertness Awake/alert  Behavior During Therapy WFL for tasks assessed/performed  Overall Cognitive Status Within Functional Limits for tasks assessed  Bed Mobility  General bed mobility comments NT--pt in chair  Transfers  Overall transfer level Needs assistance  Equipment used Rolling walker (2 wheeled)  Transfers Sit to/from Stand  Sit to Stand Supervision  General transfer comment Cues for UE/LE placement  Ambulation/Gait  Ambulation/Gait assistance Supervision;Min guard  Ambulation Distance (Feet) 100 Feet  Assistive device Rolling walker (2 wheeled)  Gait Pattern/deviations Step-to pattern;Step-through pattern  General Gait Details cues for sequence and RW position  Stairs Yes  Stairs assistance Min guard  Stair Management One rail Right;One rail Left;Step to pattern;Sideways  Number of Stairs 5  General stair comments cues for sequence  Total Joint Exercises  Knee Flexion AAROM;Right;5 reps;Seated  Long [employer] AROM;Strengthening;Right;5 reps;Seated  PT - End of Session  Equipment Utilized During Treatment Gait belt  Activity Tolerance Patient tolerated treatment well  Patient left in chair;with call bell/phone within reach  Nurse Communication Other (comment) (ready for D/C)  PT - Assessment/Plan  PT Plan Current plan remains appropriate  PT Visit Diagnosis Difficulty in walking, not elsewhere classified (R26.2)  PT Frequency (ACUTE ONLY) 7X/week   Follow Up Recommendations DC plan and follow up therapy as arranged by surgeon;Outpatient PT  PT equipment None recommended by PT;3in1 (PT)  AM-PAC PT "6 Clicks" Daily Activity Outcome Measure  Difficulty turning over in bed (including adjusting bedclothes, sheets and blankets)? 1  Difficulty moving from lying on back to sitting on the side of the bed?  1  Difficulty sitting down on and standing up from a chair with arms (e.g., wheelchair, bedside commode, etc,.)? 3  Help needed moving to and from a bed to chair (including a wheelchair)? 3  Help needed walking in hospital room? 3  Help needed climbing 3-5 steps with a railing?  3  6 Click Score 14  Mobility G Code  CK  PT Goal Progression  Progress towards PT goals Progressing toward goals  Acute Rehab PT Goals  PT Goal Formulation With patient  Time For Goal Achievement 04/09/17  Potential to Achieve Goals Good  PT Time Calculation  PT Start Time (ACUTE ONLY) 1210  PT Stop Time (ACUTE ONLY) 1227  PT Time Calculation (min) (ACUTE ONLY) 17 min  PT General Charges  $$ ACUTE PT VISIT 1 Procedure  PT Treatments  $Gait Training 8-22 mins

## 2017-04-02 NOTE — Evaluation (Signed)
Occupational Therapy Evaluation Patient Details Name: [name] MRN: [id number] DOB: 1955-10-11 Today's Date: 04/02/2017    History of Present Illness s/p L UKR   Clinical Impression   This 61 year old female was admitted for the above sx. All education was completed. No further OT is needed at this time    Follow Up Recommendations  No OT follow up;Supervision/Assistance - 24 hour    Equipment Recommendations  3 in 1 bedside commode    Recommendations for Other Services       Precautions / Restrictions Precautions Precautions: Fall Restrictions Weight Bearing Restrictions: No      Mobility Bed Mobility Overal bed mobility: Modified Independent             General bed mobility comments: HOB raised  Transfers Overall transfer level: Needs assistance Equipment used: Rolling walker (2 wheeled) Transfers: Sit to/from Stand Sit to Stand: Supervision;Min guard         General transfer comment: depending on surface height. Cues for UE/LE placement    Balance                                           ADL either performed or assessed with clinical judgement   ADL Overall ADL's : Needs assistance/impaired Eating/Feeding: Independent   Grooming: Oral care;Supervision/safety;Standing   Upper Body Bathing: Set up;Sitting   Lower Body Bathing: Supervison/ safety;Sit to/from stand   Upper Body Dressing : Set up;Sitting   Lower Body Dressing: Supervision/safety;Sit to/from stand   Toilet Transfer: Min guard;Ambulation;Comfort height toilet;Grab bars;RW   Toileting- Water quality scientist and Hygiene: Min guard;Sit to/from stand   Tub/ Shower Transfer: Walk-in shower;Min guard;Ambulation     General ADL Comments: pt needed grab bar to get up from comfort height commode. Min guard for safety due to lower height (vs bed). Able to don socks. Cues for safety as pt went to adjust bedding and turned without walker. Also make too large of a  turn with inability to keep body inside of walker at first.  Reviewed knee precautions     Vision         Perception     Praxis      Pertinent Vitals/Pain Pain Assessment: 0-10 Pain Score: 2  Pain Location: R knee Pain Descriptors / Indicators: Sore Pain Intervention(s): Limited activity within patient's tolerance;Monitored during session;Premedicated before session;Repositioned;Ice applied     Hand Dominance     Extremity/Trunk Assessment Upper Extremity Assessment Upper Extremity Assessment: Overall WFL for tasks assessed   Lower Extremity Assessment Lower Extremity Assessment: Defer to PT evaluation       Communication Communication Communication: No difficulties   Cognition Arousal/Alertness: Awake/alert Behavior During Therapy: WFL for tasks assessed/performed Overall Cognitive Status: Within Functional Limits for tasks assessed                                     General Comments       Exercises     Shoulder Instructions      Home Living Family/patient expects to be discharged to:: Private residence Living Arrangements: Spouse/significant other;Children Available Help at Discharge: Family Type of Home: House Home Access: Stairs to enter Technical brewer of Steps: 5 Entrance Stairs-Rails: Right Home Layout: Two level;Able to live on main level with bedroom/bathroom  Bathroom Shower/Tub: Occupational psychologist: Handicapped height     Home Equipment: Environmental consultant - 2 wheels          Prior Functioning/Environment Level of Independence: Independent                 OT Problem List:        OT Treatment/Interventions:      OT Goals(Current goals can be found in the care plan section) Acute Rehab OT Goals Patient Stated Goal: home; resume independence OT Goal Formulation: All assessment and education complete, DC therapy  OT Frequency:     Barriers to D/C:            Co-evaluation               AM-PAC PT "6 Clicks" Daily Activity     Outcome Measure Help from another person eating meals?: None Help from another person taking care of personal grooming?: A Little Help from another person toileting, which includes using toliet, bedpan, or urinal?: A Little Help from another person bathing (including washing, rinsing, drying)?: A Little Help from another person to put on and taking off regular upper body clothing?: A Little Help from another person to put on and taking off regular lower body clothing?: A Little 6 Click Score: 19   End of Session    Activity Tolerance: Patient tolerated treatment well Patient left: in chair;with call bell/phone within reach  OT Visit Diagnosis: Pain Pain - Right/Left: Left Pain - part of body: Knee                Time: 5102-5852 OT Time Calculation (min): 23 min Charges:  OT General Charges $OT Visit: 1 Procedure OT Evaluation $OT Eval Low Complexity: 1 Procedure G-Codes: OT G-codes **NOT FOR INPATIENT CLASS** Functional Assessment Tool Used: Clinical judgement Functional Limitation: Self care Self Care Current Status (D7824): At least 1 percent but less than 20 percent impaired, limited or restricted Self Care Goal Status (M3536): At least 1 percent but less than 20 percent impaired, limited or restricted Self Care Discharge Status [phone]): At least 1 percent but less than 20 percent impaired, limited or restricted   [name], OTR/L [phone] 04/02/2017  [name] 04/02/2017, 10:53 AM

## 2017-04-02 NOTE — Care Management Note (Signed)
Case Management Note  Patient Details  Name: [name] MRN: [id number] Date of Birth: 16-Dec-1955  Subjective/Objective:  61 y/o f admitted w/s/p R unicompartmental knee replacement. From home.[facility] dme rep Brad aware home 3n1 ordered & to deliver to rm.PT cons-await recc.OT-no f/u needs.                  Action/Plan:d/c plan home.   Expected Discharge Date:  04/02/17               Expected Discharge Plan:  Home/Self Care  In-House Referral:     Discharge planning Services  CM Consult  Post Acute Care Choice:    Choice offered to:     DME Arranged:    DME Agency:     HH Arranged:    HH Agency:     Status of Service:  In process, will continue to follow  If discussed at Long Length of Stay Meetings, dates discussed:    Additional Comments:  Dessa Phi, RN 04/02/2017, 11:14 AM

## 2017-04-03 ENCOUNTER — Encounter (HOSPITAL_COMMUNITY): Payer: Self-pay | Admitting: Orthopedic Surgery

## 2017-04-04 ENCOUNTER — Encounter: Payer: Self-pay | Admitting: Physical Therapy

## 2017-04-04 ENCOUNTER — Ambulatory Visit: Payer: BLUE CROSS/BLUE SHIELD | Attending: Orthopedic Surgery | Admitting: Physical Therapy

## 2017-04-04 DIAGNOSIS — M25561 Pain in right knee: Secondary | ICD-10-CM

## 2017-04-04 DIAGNOSIS — R2241 Localized swelling, mass and lump, right lower limb: Secondary | ICD-10-CM

## 2017-04-04 DIAGNOSIS — R262 Difficulty in walking, not elsewhere classified: Secondary | ICD-10-CM | POA: Diagnosis present

## 2017-04-04 DIAGNOSIS — M25661 Stiffness of right knee, not elsewhere classified: Secondary | ICD-10-CM | POA: Diagnosis present

## 2017-04-04 NOTE — Therapy (Signed)
[address] [place], [place], [address] Phone: [phone]   Fax:  [phone]  Physical Therapy Evaluation  Patient Details  Name: [name] MRN: [id number] Date of Birth: 1956/09/04 Referring Provider: Alvan Dame  Encounter Date: 04/04/2017      PT End of Session - 04/04/17 1019    Visit Number 1   Date for PT Re-Evaluation 06/05/17   PT Start Time 0931   PT Stop Time 1010   PT Time Calculation (min) 39 min   Activity Tolerance Patient limited by pain;Patient limited by lethargy   Behavior During Therapy [facility] for tasks assessed/performed      Past Medical History:  Diagnosis Date  . Acne rosacea   . Allergic rhinitis   . Anemia   . Colitis   . Diverticulosis   . GERD (gastroesophageal reflux disease)   . Goiter   . Hip bursitis   . Hypertension   . Hypothyroidism   . Migraines   . Multiple thyroid nodules   . Obesity   . Osteoarthritis   . Overactive bladder    carpal tunnel   . Shortness of breath    with exertion   . Tubular adenoma of colon 04/2011  . Wears glasses     Past Surgical History:  Procedure Laterality Date  . CAUTERY OF TURBINATES  1990's  . COLONOSCOPY    . GANGLION CYST EXCISION Right 06/25/2013   Procedure: RIGHT ANKLE GANGLION CYST EXCISION;  Surgeon: Wylene Simmer, MD;  Location: [place];  Service: Orthopedics;  Laterality: Right;  . INCISION AND DRAINAGE OF WOUND Right 07/23/2013   Procedure: IRRIGATION AND DEBRIDEMENT OF RIGHT ANKLE;  Surgeon: Wylene Simmer, MD;  Location: [place];  Service: Orthopedics;  Laterality: Right;  Esmark used for tourniquet--on at 1417, off at 1437.  Marland Kitchen KNEE ARTHROSCOPY  11/2010   Right  . NASAL SEPTUM SURGERY  1991  . PARTIAL KNEE ARTHROPLASTY Right 04/01/2017   Procedure: UNICOMPARTMENTAL RIGHT KNEE- Medially;  Surgeon: Paralee Cancel, MD;  Location: [facility];  Service: Orthopedics;  Laterality: Right;  90  mins  . partial knee replacement  04/2009   Left  . THYROIDECTOMY  05/08/2012   Procedure: THYROIDECTOMY;  Surgeon: Earnstine Regal, MD;  Location: [facility];  Service: General;  Laterality: N/A;  Total Thyriodectomy  . TOTAL ABDOMINAL HYSTERECTOMY W/ BILATERAL SALPINGOOPHORECTOMY  2003  . TUBAL LIGATION    . WOUND DEBRIDEMENT Right 07/27/14   ankle    There were no vitals filed for this visit.       Subjective Assessment - 04/04/17 0937    Subjective Patient underwent a right unicompartmental knee replacement on 04/01/2017 by Dr. Alvan Dame.  She reports that she has been having knee problems for about 5 years, has a history of the left partial knee in 2010.  She stayred one night in the hospital.  Reports that she feels like she did too much on Tuesday and took two pain pills this AM without eating and is nauseated right now.   Limitations Standing;Walking;House hold activities   Patient Stated Goals have no pain, better motion and walk   Currently in Pain? Yes   Pain Score 4    Pain Location Knee   Pain Orientation Right   Pain Descriptors / Indicators Aching   Pain Type Surgical pain   Pain Onset In the past 7 days   Pain Frequency Constant   Aggravating Factors  standing, bending, walking, up to 7/10   Pain Relieving Factors pain meds, mm relaxer, icing, pain has been 0/10   Effect of Pain on Daily Activities really limits everything            [facility] PT Assessment - 04/04/17 0001      Assessment   Medical Diagnosis s/p right unicompartmental knee replacement   Referring Provider Alvan Dame   Onset Date/Surgical Date 04/01/17   Prior Therapy no     Precautions   Precautions None     Balance Screen   Has the patient fallen in the past 6 months No   Has the patient had a decrease in activity level because of a fear of falling?  No   Is the patient reluctant to leave their home because of a fear of falling?  No     Home Environment   Additional Comments has 5 steps into the home,  does some housework     Prior Function   Level of Independence Independent   Vocation Full time employment   Pharmacologist   Leisure no exercise     Observation/Other Assessments-Edema    Edema Circumferential     Circumferential Edema   Circumferential - Right 53cm   Circumferential - Left  44cm     ROM / Strength   AROM / PROM / Strength AROM;PROM;Strength     AROM   AROM Assessment Site Knee   Right/Left Knee Right   Right Knee Extension 28   Right Knee Flexion 30     PROM   PROM Assessment Site Knee   Right/Left Knee Right   Right Knee Extension 0   Right Knee Flexion 40     Strength   Overall Strength Comments unable to activate the quads     Palpation   Palpation comment a lot of swelling, very tender in the lower to mid thigh     Transfers   Comments uses hands or other leg to hold the surgical leg due to inability to activate the mms     Ambulation/Gait   Gait Comments uses a FWW, slow, very stiff gait, step to pattern            Objective measurements completed on examination: See above findings.                  PT Education - 04/04/17 1018    Education provided Yes   Education Details HEP for QS and SAQ   Person(s) Educated Patient;Spouse   Methods Explanation;Demonstration;Tactile cues;Verbal cues;Handout   Comprehension Verbalized understanding;Verbal cues required;Returned demonstration;Tactile cues required;Need further instruction          PT Short Term Goals - 04/04/17 1031      PT SHORT TERM GOAL #1   Title independent with initial HEP   Time 2   Period Weeks   Status New           PT Long Term Goals - 04/04/17 1031      PT LONG TERM GOAL #1   Title understand and perform RICE   Time 8   Period Weeks   Status New     PT LONG TERM GOAL #2   Title decrease pain 50%   Time 8   Period Weeks   Status New     PT LONG TERM GOAL #3   Title walk without device 500 feet with  minimal deviation   Time 8   Period Weeks  Status New     PT LONG TERM GOAL #4   Title go up and down stairs step over step    Time 8   Period Weeks   Status New     PT LONG TERM GOAL #5   Title increase AROM of the right knee to 5-110 degrees flexion   Time 8   Period Weeks   Status New                Plan - 04/04/17 1020    Clinical Impression Statement Patient underwent a right unicompartmental knee replacement on 04/01/17.  She reports that she took two pain pills this AM without eating and is now very nauseated and unable to tolerate activity.  She is unable to really activate the quads as she is scared and has a lot of swelling.  AROM was 25-30 degrees flexion, PROM 0-40 degrees flexion.  Ambulates with a FWW, slow, step to gait with stiff right leg.  She was very nauseated and could not toerate much today   Clinical Presentation Evolving   Clinical Decision Making Low   Rehab Potential Good   PT Frequency 3x / week   PT Duration 8 weeks   PT Treatment/Interventions ADLs/Self Care Home Management;Cryotherapy;Electrical Stimulation;Gait training;Stair training;Functional mobility training;Patient/family education;Balance training;Therapeutic exercise;Therapeutic activities;Vasopneumatic Device;Manual techniques   PT Next Visit Plan slowly work on ROM and mm activtation   Consulted and Agree with Plan of Care Patient      Patient will benefit from skilled therapeutic intervention in order to improve the following deficits and impairments:  Abnormal gait, Decreased activity tolerance, Decreased balance, Decreased mobility, Decreased strength, Impaired sensation, Decreased scar mobility, Pain, Decreased range of motion, Difficulty walking  Visit Diagnosis: Acute pain of right knee - Plan: PT plan of care cert/re-cert  Stiffness of right knee, not elsewhere classified - Plan: PT plan of care cert/re-cert  Difficulty in walking, not elsewhere classified - Plan: PT plan  of care cert/re-cert  Localized swelling, mass and lump, right lower limb - Plan: PT plan of care cert/re-cert     Problem List Patient Active Problem List   Diagnosis Date Noted  . S/P right unicompartmental knee replacement 04/01/2017  . Obesity (BMI 30.0-34.9) 11/27/2012  . Hypothyroidism 09/13/2012  . Colitis 09/10/2012  . Hypertension 09/10/2012  . Rosacea 09/10/2012  . Thyroid cancer, follicular variant of papillary thyroid carcinoma 06/09/2012  . Multinodular goiter (nontoxic) 04/17/2012  . GERD 05/26/2009    Sumner Boast., PT 04/04/2017, 10:34 AM  [address] [place], [place], [address] Phone: [phone]   Fax:  [phone]  Name: [name] MRN: [id number] Date of Birth: March 07, 1956

## 2017-04-05 ENCOUNTER — Ambulatory Visit: Payer: BLUE CROSS/BLUE SHIELD | Admitting: Physical Therapy

## 2017-04-05 ENCOUNTER — Encounter: Payer: Self-pay | Admitting: Physical Therapy

## 2017-04-05 DIAGNOSIS — M25561 Pain in right knee: Secondary | ICD-10-CM

## 2017-04-05 DIAGNOSIS — M25661 Stiffness of right knee, not elsewhere classified: Secondary | ICD-10-CM

## 2017-04-05 DIAGNOSIS — R2241 Localized swelling, mass and lump, right lower limb: Secondary | ICD-10-CM

## 2017-04-05 DIAGNOSIS — R262 Difficulty in walking, not elsewhere classified: Secondary | ICD-10-CM

## 2017-04-05 NOTE — Therapy (Signed)
[address] [place], [place], [address] Phone: [phone]   Fax:  [phone]  Physical Therapy Treatment  Patient Details  Name: [name] MRN: [id number] Date of Birth: May 27, 1956 Referring Provider: Alvan Dame  Encounter Date: 04/05/2017      PT End of Session - 04/05/17 1121    Visit Number 2   Date for PT Re-Evaluation 06/05/17   PT Start Time 1013   PT Stop Time 9702   PT Time Calculation (min) 65 min   Activity Tolerance Patient limited by pain;Patient limited by lethargy  nausea, light headed   Behavior During Therapy WFL for tasks assessed/performed      Past Medical History:  Diagnosis Date  . Acne rosacea   . Allergic rhinitis   . Anemia   . Colitis   . Diverticulosis   . GERD (gastroesophageal reflux disease)   . Goiter   . Hip bursitis   . Hypertension   . Hypothyroidism   . Migraines   . Multiple thyroid nodules   . Obesity   . Osteoarthritis   . Overactive bladder    carpal tunnel   . Shortness of breath    with exertion   . Tubular adenoma of colon 04/2011  . Wears glasses     Past Surgical History:  Procedure Laterality Date  . CAUTERY OF TURBINATES  1990's  . COLONOSCOPY    . GANGLION CYST EXCISION Right 06/25/2013   Procedure: RIGHT ANKLE GANGLION CYST EXCISION;  Surgeon: Wylene Simmer, MD;  Location: [place];  Service: Orthopedics;  Laterality: Right;  . INCISION AND DRAINAGE OF WOUND Right 07/23/2013   Procedure: IRRIGATION AND DEBRIDEMENT OF RIGHT ANKLE;  Surgeon: Wylene Simmer, MD;  Location: [place];  Service: Orthopedics;  Laterality: Right;  Esmark used for tourniquet--on at 1417, off at 1437.  Marland Kitchen KNEE ARTHROSCOPY  11/2010   Right  . NASAL SEPTUM SURGERY  1991  . PARTIAL KNEE ARTHROPLASTY Right 04/01/2017   Procedure: UNICOMPARTMENTAL RIGHT KNEE- Medially;  Surgeon: Paralee Cancel, MD;  Location: [facility];  Service: Orthopedics;   Laterality: Right;  90 mins  . partial knee replacement  04/2009   Left  . THYROIDECTOMY  05/08/2012   Procedure: THYROIDECTOMY;  Surgeon: Earnstine Regal, MD;  Location: [facility];  Service: General;  Laterality: N/A;  Total Thyriodectomy  . TOTAL ABDOMINAL HYSTERECTOMY W/ BILATERAL SALPINGOOPHORECTOMY  2003  . TUBAL LIGATION    . WOUND DEBRIDEMENT Right 07/27/14   ankle    There were no vitals filed for this visit.      Subjective Assessment - 04/05/17 1012    Subjective Patient reports that her nausea is better, she is "light headed", she reports pain and swelling   Currently in Pain? Yes   Pain Score 6    Pain Location Knee   Pain Orientation Right                         [facility] Adult PT Treatment/Exercise - 04/05/17 0001      Ambulation/Gait   Gait Comments gait with SPC, cues for correct hand, to bend the knee, to get toe off and to get good weight shift     High Level Balance   High Level Balance Comments pre gait activities, weight shift side to side and front to back     Exercises   Exercises Knee/Hip  Knee/Hip Exercises: Aerobic   Nustep level 3 x 5 minutes, very slow     Knee/Hip Exercises: Standing   Knee Flexion Right;2 sets;10 reps   Hip Flexion Right;2 sets;10 reps   Hip Flexion Limitations 6" toe clears     Knee/Hip Exercises: Seated   Long Arc Quad Right;2 sets;10 reps   Heel Slides Right;2 sets;10 reps   Ball Squeeze 20   Other Seated Knee/Hip Exercises toe taps, heel raises   Hamstring Curl Right;2 sets;10 reps   Hamstring Limitations red tband     Knee/Hip Exercises: Supine   Quad Sets Right;2 sets;10 reps     Modalities   Modalities Vasopneumatic     Vasopneumatic   Number Minutes Vasopneumatic  15 minutes   Vasopnuematic Location  Knee   Vasopneumatic Pressure Medium   Vasopneumatic Temperature  35     Manual Therapy   Manual Therapy Passive ROM   Passive ROM PROM in supine, working only on flexion                 PT Education - 04/05/17 1121    Education provided Yes   Education Details gave low load long duration stretch for flexion   Person(s) Educated Patient   Methods Explanation;Demonstration   Comprehension Verbalized understanding          PT Short Term Goals - 04/04/17 1031      PT SHORT TERM GOAL #1   Title independent with initial HEP   Time 2   Period Weeks   Status New           PT Long Term Goals - 04/04/17 1031      PT LONG TERM GOAL #1   Title understand and perform RICE   Time 8   Period Weeks   Status New     PT LONG TERM GOAL #2   Title decrease pain 50%   Time 8   Period Weeks   Status New     PT LONG TERM GOAL #3   Title walk without device 500 feet with minimal deviation   Time 8   Period Weeks   Status New     PT LONG TERM GOAL #4   Title go up and down stairs step over step    Time 8   Period Weeks   Status New     PT LONG TERM GOAL #5   Title increase AROM of the right knee to 5-110 degrees flexion   Time 8   Period Weeks   Status New               Plan - 04/05/17 1122    Clinical Impression Statement Patient needed two times where she needed to lie down and be fanned due to nausea and light headed.  She feel like it is the affects of the medicine.  She was able to get about 70 degrees of flexion today, but needed a lot of cues to relax.  She does not trust the right LE and needs a lot of cues to bear weight and bend the knee with gait   PT Next Visit Plan slowly work on ROM and mm activtation   Consulted and Agree with Plan of Care Patient      Patient will benefit from skilled therapeutic intervention in order to improve the following deficits and impairments:  Abnormal gait, Decreased activity tolerance, Decreased balance, Decreased mobility, Decreased strength, Impaired sensation, Decreased scar mobility, Pain, Decreased range of  motion, Difficulty walking  Visit Diagnosis: Acute pain of right knee  Stiffness of  right knee, not elsewhere classified  Difficulty in walking, not elsewhere classified  Localized swelling, mass and lump, right lower limb     Problem List Patient Active Problem List   Diagnosis Date Noted  . S/P right unicompartmental knee replacement 04/01/2017  . Obesity (BMI 30.0-34.9) 11/27/2012  . Hypothyroidism 09/13/2012  . Colitis 09/10/2012  . Hypertension 09/10/2012  . Rosacea 09/10/2012  . Thyroid cancer, follicular variant of papillary thyroid carcinoma 06/09/2012  . Multinodular goiter (nontoxic) 04/17/2012  . GERD 05/26/2009    Sumner Boast., PT 04/05/2017, 11:24 AM  [place] [address] [facility] [place], [place], [address] Phone: [phone]   Fax:  [phone]  Name: [name] MRN: [id number] Date of Birth: 07-08-1956

## 2017-04-08 ENCOUNTER — Ambulatory Visit: Payer: BLUE CROSS/BLUE SHIELD | Admitting: Physical Therapy

## 2017-04-08 ENCOUNTER — Encounter: Payer: Self-pay | Admitting: Physical Therapy

## 2017-04-08 DIAGNOSIS — R262 Difficulty in walking, not elsewhere classified: Secondary | ICD-10-CM

## 2017-04-08 DIAGNOSIS — M25561 Pain in right knee: Secondary | ICD-10-CM

## 2017-04-08 DIAGNOSIS — R2241 Localized swelling, mass and lump, right lower limb: Secondary | ICD-10-CM

## 2017-04-08 DIAGNOSIS — M25661 Stiffness of right knee, not elsewhere classified: Secondary | ICD-10-CM

## 2017-04-08 NOTE — Discharge Summary (Signed)
Physician Discharge Summary  Patient ID: [name] MRN: [id number] DOB/AGE: 62-May-1957 62 y.o.  Admit date: 04/01/2017 Discharge date: 04/02/2017   Procedures:  Procedure(s) (LRB): UNICOMPARTMENTAL RIGHT KNEE- Medially (Right)  Attending Physician:  Dr. Paralee Cancel   Admission Diagnoses:   Right knee medial compartmental primary OA /pain  Discharge Diagnoses:  Active Problems:   S/P right unicompartmental knee replacement  Past Medical History:  Diagnosis Date  . Acne rosacea   . Allergic rhinitis   . Anemia   . Colitis   . Diverticulosis   . GERD (gastroesophageal reflux disease)   . Goiter   . Hip bursitis   . Hypertension   . Hypothyroidism   . Migraines   . Multiple thyroid nodules   . Obesity   . Osteoarthritis   . Overactive bladder    carpal tunnel   . Shortness of breath    with exertion   . Tubular adenoma of colon 04/2011  . Wears glasses     HPI:    [name], 61 y.o. female female, has a history of pain and functional disability in the right and has failed non-surgical conservative treatments for greater than 12 weeks to include NSAID's and/or analgesics, corticosteriod injections, use of assistive devices and activity modification.  Onset of symptoms was gradual, starting 2+ years ago with gradually worsening course since that time. The patient noted prior procedures on the knee to include  unicompartmental arthroplasty on the left knee, in August 2010.  Patient currently rates pain in the right knee(s) at 8 out of 10 with activity. Patient has night pain, worsening of pain with activity and weight bearing, pain that interferes with activities of daily living, pain with passive range of motion, crepitus and joint swelling.  Patient has evidence of periarticular osteophytes and joint space narrowing of the medial compartment by imaging studies.  There is no active infection.  Risks, benefits and expectations were discussed with the patient.  Risks  including but not limited to the risk of anesthesia, blood clots, nerve damage, blood vessel damage, failure of the prosthesis, infection and up to and including death.  Patient understand the risks, benefits and expectations and wishes to proceed with surgery.   PCP: Harlan Stains, MD   Discharged Condition: good  Hospital Course:  Patient underwent the above stated procedure on 04/01/2017. Patient tolerated the procedure well and brought to the recovery room in good condition and subsequently to the floor.  POD #1 BP: 131/56 ; Pulse: 88 ; Temp: 98.6 F (37 C) ; Resp: 16 Patient reports pain as mild,l pain controlled. No events throughout the night.  Discussed OPPT and a Rx is given to her. Dorsiflexion/plantar flexion intact, incision: dressing C/D/I, no cellulitis present and compartment soft.   LABS  Basename    HGB     12.5  HCT     37.5    Discharge Exam: General appearance: alert, cooperative and no distress Extremities: Homans sign is negative, no sign of DVT, no edema, redness or tenderness in the calves or thighs and no ulcers, gangrene or trophic changes  Disposition: Home with follow up in 2 weeks   Follow-up Information    Paralee Cancel, MD. Schedule an appointment as soon as possible for a visit in 2 week(s).   Specialty:  Orthopedic Surgery Contact information: [address] [place] [address] [phone]        [facility] - Dme Follow up.  Why:  home bedside commode Contact information: [address] [place]  [address] [phone]           Discharge Instructions    Call MD / Call 911    Complete by:  As directed    If you experience chest pain or shortness of breath, CALL 911 and be transported to the hospital emergency room.  If you develope a fever above 101 F, pus (white drainage) or increased drainage or redness at the wound, or calf pain, call your surgeon's office.   Change dressing    Complete  by:  As directed    Maintain surgical dressing until follow up in the clinic. If the edges start to pull up, may reinforce with tape. If the dressing is no longer working, may remove and cover with gauze and tape, but must keep the area dry and clean.  Call with any questions or concerns.   Constipation Prevention    Complete by:  As directed    Drink plenty of fluids.  Prune juice may be helpful.  You may use a stool softener, such as Colace (over the counter) 100 mg twice a day.  Use MiraLax (over the counter) for constipation as needed.   Diet - low sodium heart healthy    Complete by:  As directed    Discharge instructions    Complete by:  As directed    Maintain surgical dressing until follow up in the clinic. If the edges start to pull up, may reinforce with tape. If the dressing is no longer working, may remove and cover with gauze and tape, but must keep the area dry and clean.  Follow up in 2 weeks at [facility] Call with any questions or concerns.   Increase activity slowly as tolerated    Complete by:  As directed    Weight bearing as tolerated with assist device (walker, cane, etc) as directed, use it as long as suggested by your surgeon or therapist, typically at least 4-6 weeks.   TED hose    Complete by:  As directed    Use stockings (TED hose) for 2 weeks on both leg(s).  You may remove them at night for sleeping.      Allergies as of 04/02/2017      Reactions   Cephalexin Hives   Macrobid [nitrofurantoin Macrocrystal] Nausea And Vomiting, Other (See Comments)   Light headed   Zithromax [azithromycin] Other (See Comments)   Gi upset       Medication List    STOP taking these medications   ALEVE 220 MG Caps Generic drug:  Naproxen Sodium     TAKE these medications   aspirin 81 MG chewable tablet Chew 1 tablet (81 mg total) by mouth 2 (two) times daily. Take for 4 weeks.   CALCIUM 1000 + D PO Take by mouth.   CALCIUM 600 + D PO Take 1 capsule by  mouth 2 (two) times daily.   docusate sodium 100 MG capsule Commonly known as:  COLACE Take 1 capsule (100 mg total) by mouth 2 (two) times daily.   ferrous sulfate 325 (65 FE) MG tablet Commonly known as:  FERROUSUL Take 1 tablet (325 mg total) by mouth 3 (three) times daily with meals.   fluticasone 50 MCG/ACT nasal spray Commonly known as:  FLONASE Place 2 sprays into the nose daily as needed for allergies.   HYDROcodone-acetaminophen 7.5-325 MG tablet Commonly known as:  NORCO Take 1-2 tablets by mouth  every 4 (four) hours as needed for moderate pain or severe pain.   hyoscyamine 0.125 MG SL tablet Commonly known as:  LEVSIN SL Take 1 tablet (0.125 mg total) by mouth 3 (three) times daily before meals. What changed:  when to take this  reasons to take this   lansoprazole 15 MG capsule Commonly known as:  PREVACID Take 15 mg by mouth daily.   levothyroxine 175 MCG tablet Commonly known as:  SYNTHROID, LEVOTHROID Take 175 mcg by mouth at bedtime.   LORazepam 0.5 MG tablet Commonly known as:  ATIVAN Take 0.5 mg by mouth 2 (two) times daily as needed for anxiety.   methocarbamol 500 MG tablet Commonly known as:  ROBAXIN Take 1 tablet (500 mg total) by mouth every 6 (six) hours as needed for muscle spasms.   metroNIDAZOLE 0.75 % cream Commonly known as:  METROCREAM Apply 1 application topically 2 (two) times daily.   polyethylene glycol packet Commonly known as:  MIRALAX / GLYCOLAX Take 17 g by mouth 2 (two) times daily.   psyllium 0.52 g capsule Commonly known as:  REGULOID Take 2 capsules by mouth at bedtime.   RESTORA Caps TAKE ONE CAPSULE BY MOUTH EVERY DAY (NEED APPT FOR FURTHER REFILLS) What changed:  how much to take  how to take this  when to take this  additional instructions   valACYclovir 500 MG tablet Commonly known as:  VALTREX Take 1,000 mg by mouth 2 (two) times daily as needed (fever blisters).   valsartan 320 MG tablet Commonly  known as:  DIOVAN Take 320 mg by mouth at bedtime.   VITAMIN D PO Take 1 tablet by mouth daily.   zolpidem 5 MG tablet Commonly known as:  AMBIEN Take 2.5 mg by mouth at bedtime as needed for sleep. What changed:  Another medication with the same name was removed. Continue taking this medication, and follow the directions you see here.        Signed: West Pugh. Nan Maya   PA-C  04/08/2017, 4:45 PM

## 2017-04-08 NOTE — Therapy (Signed)
[place] [address] [place], [place], [address] Phone: [phone]   Fax:  [phone]  Physical Therapy Treatment  Patient Details  Name: [name] MRN: [id number] Date of Birth: 01/23/1956 Referring Provider: Alvan Dame  Encounter Date: 04/08/2017      PT End of Session - 04/08/17 1128    Visit Number 3   Date for PT Re-Evaluation 06/05/17   PT Start Time 1016   PT Stop Time 1110   PT Time Calculation (min) 54 min   Activity Tolerance Patient limited by pain;Patient limited by lethargy   Behavior During Therapy [facility] for tasks assessed/performed      Past Medical History:  Diagnosis Date  . Acne rosacea   . Allergic rhinitis   . Anemia   . Colitis   . Diverticulosis   . GERD (gastroesophageal reflux disease)   . Goiter   . Hip bursitis   . Hypertension   . Hypothyroidism   . Migraines   . Multiple thyroid nodules   . Obesity   . Osteoarthritis   . Overactive bladder    carpal tunnel   . Shortness of breath    with exertion   . Tubular adenoma of colon 04/2011  . Wears glasses     Past Surgical History:  Procedure Laterality Date  . CAUTERY OF TURBINATES  1990's  . COLONOSCOPY    . GANGLION CYST EXCISION Right 06/25/2013   Procedure: RIGHT ANKLE GANGLION CYST EXCISION;  Surgeon: Wylene Simmer, MD;  Location: [facility];  Service: Orthopedics;  Laterality: Right;  . INCISION AND DRAINAGE OF WOUND Right 07/23/2013   Procedure: IRRIGATION AND DEBRIDEMENT OF RIGHT ANKLE;  Surgeon: Wylene Simmer, MD;  Location: [facility];  Service: Orthopedics;  Laterality: Right;  Esmark used for tourniquet--on at 1417, off at 1437.  Marland Kitchen KNEE ARTHROSCOPY  11/2010   Right  . NASAL SEPTUM SURGERY  1991  . PARTIAL KNEE ARTHROPLASTY Right 04/01/2017   Procedure: UNICOMPARTMENTAL RIGHT KNEE- Medially;  Surgeon: Paralee Cancel, MD;  Location: [facility];  Service: Orthopedics;  Laterality: Right;  90  mins  . partial knee replacement  04/2009   Left  . THYROIDECTOMY  05/08/2012   Procedure: THYROIDECTOMY;  Surgeon: Earnstine Regal, MD;  Location: [facility];  Service: General;  Laterality: N/A;  Total Thyriodectomy  . TOTAL ABDOMINAL HYSTERECTOMY W/ BILATERAL SALPINGOOPHORECTOMY  2003  . TUBAL LIGATION    . WOUND DEBRIDEMENT Right 07/27/14   ankle    There were no vitals filed for this visit.      Subjective Assessment - 04/08/17 1022    Subjective Patient reports a "bad weekend" she reports that Friday afternoon she had a large area that was purple and "hot" on the medial and posterior mid thigh.  She reports that she called the MD and they felt like it was the deep bruising from the tourniquet.  She reports that she did not do any stretches or exercise   Currently in Pain? Yes   Pain Score 5    Pain Location Knee   Pain Orientation Right                         [facility] Adult PT Treatment/Exercise - 04/08/17 0001      Ambulation/Gait   Gait Comments HHA slow, very stiff, stiffer today than last week     High Level Balance   High Level  Balance Comments pre gait activities, weight shift side to side and front to back     Knee/Hip Exercises: Aerobic   Nustep level 3 x 5 minutes, very slow     Knee/Hip Exercises: Machines for Strengthening   Cybex Leg Press 20# 2x10, then right leg only no weight, able to come down to position 9 1/2 only due to pain     Knee/Hip Exercises: Standing   Knee Flexion Right;2 sets;10 reps   Hip Flexion Right;2 sets;10 reps   Hip Flexion Limitations 6" toe clears     Knee/Hip Exercises: Seated   Long Arc Quad Right;2 sets;10 reps   Long Arc Quad Limitations some assist from red tband to get started   Heel Slides Right;2 sets;10 reps   [facility] 20   Other Seated Knee/Hip Exercises toe taps, heel raises   Hamstring Curl Right;2 sets;10 reps   Hamstring Limitations red tband     Vasopneumatic   Number Minutes Vasopneumatic  15  minutes  had to stop early, felt like a seam was "pushing on the stit   Vasopnuematic Location  Knee   Vasopneumatic Pressure Medium   Vasopneumatic Temperature  35     Manual Therapy   Manual Therapy Passive ROM   Passive ROM PROM in sitting, working only on flexion                  PT Short Term Goals - 04/04/17 1031      PT SHORT TERM GOAL #1   Title independent with initial HEP   Time 2   Period Weeks   Status New           PT Long Term Goals - 04/04/17 1031      PT LONG TERM GOAL #1   Title understand and perform RICE   Time 8   Period Weeks   Status New     PT LONG TERM GOAL #2   Title decrease pain 50%   Time 8   Period Weeks   Status New     PT LONG TERM GOAL #3   Title walk without device 500 feet with minimal deviation   Time 8   Period Weeks   Status New     PT LONG TERM GOAL #4   Title go up and down stairs step over step    Time 8   Period Weeks   Status New     PT LONG TERM GOAL #5   Title increase AROM of the right knee to 5-110 degrees flexion   Time 8   Period Weeks   Status New               Plan - 04/08/17 1130    Clinical Impression Statement Patient actually doing worse today than last week, she reports bruising and warmth in the posterior medial thigh on Friday, MD felt like it was from the tourniquet.  She reports that she stopped all exercises, she is very stiff, reports that she has "tightness" with any stretches   PT Next Visit Plan slowly work on ROM and mm activtation   Consulted and Agree with Plan of Care Patient      Patient will benefit from skilled therapeutic intervention in order to improve the following deficits and impairments:  Abnormal gait, Decreased activity tolerance, Decreased balance, Decreased mobility, Decreased strength, Impaired sensation, Decreased scar mobility, Pain, Decreased range of motion, Difficulty walking  Visit Diagnosis: Acute pain of right knee  Stiffness of right knee,  not elsewhere classified  Difficulty in walking, not elsewhere classified  Localized swelling, mass and lump, right lower limb     Problem List Patient Active Problem List   Diagnosis Date Noted  . S/P right unicompartmental knee replacement 04/01/2017  . Obesity (BMI 30.0-34.9) 11/27/2012  . Hypothyroidism 09/13/2012  . Colitis 09/10/2012  . Hypertension 09/10/2012  . Rosacea 09/10/2012  . Thyroid cancer, follicular variant of papillary thyroid carcinoma 06/09/2012  . Multinodular goiter (nontoxic) 04/17/2012  . GERD 05/26/2009    Sumner Boast., PT 04/08/2017, 11:32 AM  [place] [address] [place], [place], [address] Phone: [phone]   Fax:  [phone]  Name: [name] MRN: [id number] Date of Birth: July 24, 1956

## 2017-04-10 ENCOUNTER — Encounter: Payer: Self-pay | Admitting: Physical Therapy

## 2017-04-10 ENCOUNTER — Ambulatory Visit: Payer: BLUE CROSS/BLUE SHIELD | Attending: Orthopedic Surgery | Admitting: Physical Therapy

## 2017-04-10 DIAGNOSIS — M25561 Pain in right knee: Secondary | ICD-10-CM

## 2017-04-10 DIAGNOSIS — R2241 Localized swelling, mass and lump, right lower limb: Secondary | ICD-10-CM | POA: Diagnosis present

## 2017-04-10 DIAGNOSIS — M25661 Stiffness of right knee, not elsewhere classified: Secondary | ICD-10-CM | POA: Insufficient documentation

## 2017-04-10 DIAGNOSIS — R262 Difficulty in walking, not elsewhere classified: Secondary | ICD-10-CM | POA: Insufficient documentation

## 2017-04-10 NOTE — Therapy (Signed)
[place], [place], [address] Phone: [phone]   Fax:  [phone]  Physical Therapy Treatment  Patient Details  Name: [name] MRN: [id number] Date of Birth: May 09, 1956 Referring Provider: Alvan Dame  Encounter Date: 04/10/2017      PT End of Session - 04/10/17 1429    Visit Number 4   Date for PT Re-Evaluation 06/05/17   PT Start Time 3846   PT Stop Time 1444   PT Time Calculation (min) 55 min   Activity Tolerance Patient tolerated treatment well   Behavior During Therapy [facility] for tasks assessed/performed      Past Medical History:  Diagnosis Date  . Acne rosacea   . Allergic rhinitis   . Anemia   . Colitis   . Diverticulosis   . GERD (gastroesophageal reflux disease)   . Goiter   . Hip bursitis   . Hypertension   . Hypothyroidism   . Migraines   . Multiple thyroid nodules   . Obesity   . Osteoarthritis   . Overactive bladder    carpal tunnel   . Shortness of breath    with exertion   . Tubular adenoma of colon 04/2011  . Wears glasses     Past Surgical History:  Procedure Laterality Date  . CAUTERY OF TURBINATES  1990's  . COLONOSCOPY    . GANGLION CYST EXCISION Right 06/25/2013   Procedure: RIGHT ANKLE GANGLION CYST EXCISION;  Surgeon: Wylene Simmer, MD;  Location: [facility];  Service: Orthopedics;  Laterality: Right;  . INCISION AND DRAINAGE OF WOUND Right 07/23/2013   Procedure: IRRIGATION AND DEBRIDEMENT OF RIGHT ANKLE;  Surgeon: Wylene Simmer, MD;  Location: [facility];  Service: Orthopedics;  Laterality: Right;  Esmark used for tourniquet--on at 1417, off at 1437.  Marland Kitchen KNEE ARTHROSCOPY  11/2010   Right  . NASAL SEPTUM SURGERY  1991  . PARTIAL KNEE ARTHROPLASTY Right 04/01/2017   Procedure: UNICOMPARTMENTAL RIGHT KNEE- Medially;  Surgeon: Paralee Cancel, MD;  Location[facility];  Service: Orthopedics;  Laterality: Right;  90 mins  . partial knee  replacement  04/2009   Left  . THYROIDECTOMY  05/08/2012   Procedure: THYROIDECTOMY;  Surgeon: Earnstine Regal, MD;  Location[facility];  Service: General;  Laterality: N/A;  Total Thyriodectomy  . TOTAL ABDOMINAL HYSTERECTOMY W/ BILATERAL SALPINGOOPHORECTOMY  2003  . TUBAL LIGATION    . WOUND DEBRIDEMENT Right 07/27/14   ankle    There were no vitals filed for this visit.      Subjective Assessment - 04/10/17 1353    Subjective Pt reports that she has a little more swelling at the bottom of her leg.   Currently in Pain? Yes   Pain Location Knee   Pain Orientation Right                         [facility] Adult PT Treatment/Exercise - 04/10/17 0001      Knee/Hip Exercises: Aerobic   Nustep level 3 x 5 minutes, very slow     Knee/Hip Exercises: Machines for Strengthening   Cybex Leg Press 20# 2x10, then right leg only no weight, able to come down to position 9 1/2 only due to pain     Knee/Hip Exercises: Seated   Long Arc Quad Right;2 sets;4 sets;5 reps  2 were sets eccentrics    Ball Squeeze 2x10 3 sec hold  Hamstring Curl Right;2 sets;10 reps   Hamstring Limitations red tband   Sit to Sand 2 sets;10 reps;with UE support     Vasopneumatic   Number Minutes Vasopneumatic  15 minutes   Vasopnuematic Location  Knee   Vasopneumatic Pressure Medium   Vasopneumatic Temperature  35     Manual Therapy   Manual Therapy Passive ROM   Passive ROM PROM in sitting, working only on flexion                  PT Short Term Goals - 04/04/17 1031      PT SHORT TERM GOAL #1   Title independent with initial HEP   Time 2   Period Weeks   Status New           PT Long Term Goals - 04/04/17 1031      PT LONG TERM GOAL #1   Title understand and perform RICE   Time 8   Period Weeks   Status New     PT LONG TERM GOAL #2   Title decrease pain 50%   Time 8   Period Weeks   Status New     PT LONG TERM GOAL #3   Title walk without device 500 feet with  minimal deviation   Time 8   Period Weeks   Status New     PT LONG TERM GOAL #4   Title go up and down stairs step over step    Time 8   Period Weeks   Status New     PT LONG TERM GOAL #5   Title increase AROM of the right knee to 5-110 degrees flexion   Time 8   Period Weeks   Status New               Plan - 04/10/17 1430    Clinical Impression Statement Pt with abetter tolerance with today's activities. Pt guarded at times needing cues to use RLE instead of assisting it with UE and other LE. SL let press pt able to get low as level *. Very weak quad, assist needed with LAQ.   Rehab Potential Good   PT Frequency 3x / week   PT Duration 8 weeks   PT Treatment/Interventions ADLs/Self Care Home Management;Cryotherapy;Electrical Stimulation;Gait training;Stair training;Functional mobility training;Patient/family education;Balance training;Therapeutic exercise;Therapeutic activities;Vasopneumatic Device;Manual techniques   PT Next Visit Plan slowly work on ROM and mm activtation      Patient will benefit from skilled therapeutic intervention in order to improve the following deficits and impairments:  Abnormal gait, Decreased activity tolerance, Decreased balance, Decreased mobility, Decreased strength, Impaired sensation, Decreased scar mobility, Pain, Decreased range of motion, Difficulty walking  Visit Diagnosis: Acute pain of right knee  Stiffness of right knee, not elsewhere classified  Difficulty in walking, not elsewhere classified  Localized swelling, mass and lump, right lower limb     Problem List Patient Active Problem List   Diagnosis Date Noted  . S/P right unicompartmental knee replacement 04/01/2017  . Obesity (BMI 30.0-34.9) 11/27/2012  . Hypothyroidism 09/13/2012  . Colitis 09/10/2012  . Hypertension 09/10/2012  . Rosacea 09/10/2012  . Thyroid cancer, follicular variant of papillary thyroid carcinoma 06/09/2012  . Multinodular goiter (nontoxic)  04/17/2012  . GERD 05/26/2009    Scot Jun, PTA 04/10/2017, 2:31 PM  [place] [address] [place], [place], [address] Phone: [phone]   Fax:  [phone]  Name: Robin Stormes  [name] MRN: [id number] Date of Birth: 1956-08-14

## 2017-04-12 ENCOUNTER — Encounter: Payer: Self-pay | Admitting: Physical Therapy

## 2017-04-12 ENCOUNTER — Ambulatory Visit: Payer: BLUE CROSS/BLUE SHIELD | Admitting: Physical Therapy

## 2017-04-12 DIAGNOSIS — M25561 Pain in right knee: Secondary | ICD-10-CM | POA: Diagnosis not present

## 2017-04-12 DIAGNOSIS — M25661 Stiffness of right knee, not elsewhere classified: Secondary | ICD-10-CM

## 2017-04-12 DIAGNOSIS — R2241 Localized swelling, mass and lump, right lower limb: Secondary | ICD-10-CM

## 2017-04-12 DIAGNOSIS — R262 Difficulty in walking, not elsewhere classified: Secondary | ICD-10-CM

## 2017-04-12 NOTE — Therapy (Signed)
[place], [place], [address] Phone: [phone]   Fax:  [phone]  Physical Therapy Treatment  Patient Details  Name: [name] MRN: [id number] Date of Birth: [birth year] Referring Provider: Alvan Dame  Encounter Date: 04/12/2017      PT End of Session - 04/12/17 1103    Visit Number 5   Date for PT Re-Evaluation 06/05/17   PT Start Time 1016   PT Stop Time 1118   PT Time Calculation (min) 62 min   Activity Tolerance Patient tolerated treatment well;Patient limited by pain      Past Medical History:  Diagnosis Date  . Acne rosacea   . Allergic rhinitis   . Anemia   . Colitis   . Diverticulosis   . GERD (gastroesophageal reflux disease)   . Goiter   . Hip bursitis   . Hypertension   . Hypothyroidism   . Migraines   . Multiple thyroid nodules   . Obesity   . Osteoarthritis   . Overactive bladder    carpal tunnel   . Shortness of breath    with exertion   . Tubular adenoma of colon 04/2011  . Wears glasses     Past Surgical History:  Procedure Laterality Date  . CAUTERY OF TURBINATES  1990's  . COLONOSCOPY    . GANGLION CYST EXCISION Right 06/25/2013   Procedure: RIGHT ANKLE GANGLION CYST EXCISION;  Surgeon: Wylene Simmer, MD;  Location: [facility];  Service: Orthopedics;  Laterality: Right;  . INCISION AND DRAINAGE OF WOUND Right 07/23/2013   Procedure: IRRIGATION AND DEBRIDEMENT OF RIGHT ANKLE;  Surgeon: Wylene Simmer, MD;  Location: [facility];  Service: Orthopedics;  Laterality: Right;  [employer]smark used for tourniquet--on at 1417, off at 1437.  Marland Kitchen KNEE ARTHROSCOPY  11/2010   Right  . NASAL SEPTUM SURGERY  1991  . PARTIAL KNEE ARTHROPLASTY Right 04/01/2017   Procedure: UNICOMPARTMENTAL RIGHT KNEE- Medially;  Surgeon: Paralee Cancel, MD;  Location: [facility];  Service: Orthopedics;  Laterality: Right;  90 mins  . partial knee replacement  04/2009   Left  .  THYROIDECTOMY  05/08/2012   Procedure: THYROIDECTOMY;  Surgeon: Earnstine Regal, MD;  Location: [facility];  Service: General;  Laterality: N/A;  Total Thyriodectomy  . TOTAL ABDOMINAL HYSTERECTOMY W/ BILATERAL SALPINGOOPHORECTOMY  2003  . TUBAL LIGATION    . WOUND DEBRIDEMENT Right 07/27/14   ankle    There were no vitals filed for this visit.      Subjective Assessment - 04/12/17 1019    Subjective "It is just always stiff"   Currently in Pain? No/denies   Pain Score 0-No pain                         [facility] Adult PT Treatment/Exercise - 04/12/17 0001      Knee/Hip Exercises: Aerobic   Nustep level 3 x 6 minutes, very slow     Knee/Hip Exercises: Machines for Strengthening   Cybex Leg Press 20# 2x10, then right leg only no weight, able to come down to position 9 1/2 only due to pain     Knee/Hip Exercises: Seated   Hamstring Curl Right;2 sets;10 reps   Hamstring Limitations red tband   Sit to Sand 2 sets;10 reps;with UE support  elevated UBE seat      Knee/Hip Exercises: Supine   Quad Sets Right;2 sets;10 reps  seated,  tactile cues    Short Arc Quad Sets Right;2 sets;10 reps  seateed      Vasopneumatic   Number Minutes Vasopneumatic  15 minutes   Vasopnuematic Location  Knee   Vasopneumatic Pressure Low   Vasopneumatic Temperature  35     Manual Therapy   Manual Therapy Passive ROM   Manual therapy comments some PROM taken to end range and held   Passive ROM PROM in sitting, working only on flexion                  PT Short Term Goals - 04/04/17 1031      PT SHORT TERM GOAL #1   Title independent with initial HEP   Time 2   Period Weeks   Status New           PT Long Term Goals - 04/12/17 1104      PT LONG TERM GOAL #1   Title understand and perform RICE   Status Achieved     PT LONG TERM GOAL #2   Title decrease pain 50%   Status Partially Met               Plan - 04/12/17 1103    Clinical Impression Statement Pt  knee really stiff at the beginning of therapy. MT taken really slow, pt is fearful of pain. Pt able to achieve quad contraction with quad sets with hand pushing up behind knee and cueing pt to push down. Progressed to SAQ without assist.    Rehab Potential Good   PT Frequency 3x / week   PT Duration 8 weeks   PT Treatment/Interventions ADLs/Self Care Home Management;Cryotherapy;Electrical Stimulation;Gait training;Stair training;Functional mobility training;Patient/family education;Balance training;Therapeutic exercise;Therapeutic activities;Vasopneumatic Device;Manual techniques   PT Next Visit Plan slowly work on ROM and mm activtation      Patient will benefit from skilled therapeutic intervention in order to improve the following deficits and impairments:  Abnormal gait, Decreased activity tolerance, Decreased balance, Decreased mobility, Decreased strength, Impaired sensation, Decreased scar mobility, Pain, Decreased range of motion, Difficulty walking  Visit Diagnosis: Acute pain of right knee  Stiffness of right knee, not elsewhere classified  Difficulty in walking, not elsewhere classified  Localized swelling, mass and lump, right lower limb     Problem List Patient Active Problem List   Diagnosis Date Noted  . S/P right unicompartmental knee replacement 04/01/2017  . Obesity (BMI 30.0-34.9) 11/27/2012  . Hypothyroidism 09/13/2012  . Colitis 09/10/2012  . Hypertension 09/10/2012  . Rosacea 09/10/2012  . Thyroid cancer, follicular variant of papillary thyroid carcinoma 06/09/2012  . Multinodular goiter (nontoxic) 04/17/2012  . GERD 05/26/2009    Scot Jun, PTA 04/12/2017, 11:05 AM  [place], [place], [address] Phone: [phone]   Fax:  [phone]  Name: [name] MRN: [id number] Date of Birth: 09/16/1955

## 2017-04-15 ENCOUNTER — Ambulatory Visit: Payer: BLUE CROSS/BLUE SHIELD | Admitting: Physical Therapy

## 2017-04-15 ENCOUNTER — Encounter: Payer: Self-pay | Admitting: Physical Therapy

## 2017-04-15 DIAGNOSIS — R262 Difficulty in walking, not elsewhere classified: Secondary | ICD-10-CM

## 2017-04-15 DIAGNOSIS — M25661 Stiffness of right knee, not elsewhere classified: Secondary | ICD-10-CM

## 2017-04-15 DIAGNOSIS — M25561 Pain in right knee: Secondary | ICD-10-CM | POA: Diagnosis not present

## 2017-04-15 DIAGNOSIS — R2241 Localized swelling, mass and lump, right lower limb: Secondary | ICD-10-CM

## 2017-04-15 NOTE — Therapy (Signed)
[place], [place], [address] Phone: [phone]   Fax:  [phone]  Physical Therapy Treatment  Patient Details  Name: [name] MRN: [id number] Date of Birth: [birth year] Referring Provider: Alvan Dame  Encounter Date: 04/15/2017      PT End of Session - 04/15/17 1602    Visit Number 6   Date for PT Re-Evaluation 06/05/17   PT Start Time 7654   PT Stop Time 1614   PT Time Calculation (min) 59 min   Activity Tolerance Patient tolerated treatment well   Behavior During Therapy [facility] for tasks assessed/performed      Past Medical History:  Diagnosis Date  . Acne rosacea   . Allergic rhinitis   . Anemia   . Colitis   . Diverticulosis   . GERD (gastroesophageal reflux disease)   . Goiter   . Hip bursitis   . Hypertension   . Hypothyroidism   . Migraines   . Multiple thyroid nodules   . Obesity   . Osteoarthritis   . Overactive bladder    carpal tunnel   . Shortness of breath    with exertion   . Tubular adenoma of colon 04/2011  . Wears glasses     Past Surgical History:  Procedure Laterality Date  . CAUTERY OF TURBINATES  1990's  . COLONOSCOPY    . GANGLION CYST EXCISION Right 06/25/2013   Procedure: RIGHT ANKLE GANGLION CYST EXCISION;  Surgeon: Wylene Simmer, MD;  Location: [facility];  Service: Orthopedics;  Laterality: Right;  . INCISION AND DRAINAGE OF WOUND Right 07/23/2013   Procedure: IRRIGATION AND DEBRIDEMENT OF RIGHT ANKLE;  Surgeon: Wylene Simmer, MD;  Location: [facility];  Service: Orthopedics;  Laterality: Right;  Esmark used for tourniquet--on at 1417, off at 1437.  Marland Kitchen KNEE ARTHROSCOPY  11/2010   Right  . NASAL SEPTUM SURGERY  1991  . PARTIAL KNEE ARTHROPLASTY Right 04/01/2017   Procedure: UNICOMPARTMENTAL RIGHT KNEE- Medially;  Surgeon: Paralee Cancel, MD;  Location: [facility];  Service: Orthopedics;  Laterality: Right;  90 mins  . partial knee  replacement  04/2009   Left  . THYROIDECTOMY  05/08/2012   Procedure: THYROIDECTOMY;  Surgeon: Earnstine Regal, MD;  Location: [facility];  Service: General;  Laterality: N/A;  Total Thyriodectomy  . TOTAL ABDOMINAL HYSTERECTOMY W/ BILATERAL SALPINGOOPHORECTOMY  2003  . TUBAL LIGATION    . WOUND DEBRIDEMENT Right 07/27/14   ankle    There were no vitals filed for this visit.      Subjective Assessment - 04/15/17 1516    Subjective Pt reports that the MD was pleased with her RLE extension, but wanted to see more flexion.   Currently in Pain? Yes   Pain Score 2    Pain Location Knee   Pain Orientation Right   Pain Descriptors / Indicators Aching                         [facility] Adult PT Treatment/Exercise - 04/15/17 0001      Knee/Hip Exercises: Aerobic   Nustep level 3 x 6 minutes, very slow LE only      Knee/Hip Exercises: Machines for Strengthening   Cybex Leg Press 30# 2x10, then right leg only no weight, able to come down to position 9 1/2 only due to pain     Knee/Hip Exercises: Supine   Quad Sets Right;10 reps;1  set   Short Arc [employer] Right;2 sets;10 reps   Heel Slides 2 sets;10 reps   Straight Leg Raises Right;2 sets;10 reps     Vasopneumatic   Number Minutes Vasopneumatic  15 minutes   Vasopnuematic Location  Knee   Vasopneumatic Pressure Low   Vasopneumatic Temperature  35     Manual Therapy   Manual Therapy Passive ROM   Manual therapy comments some PROM taken to end range and held   Passive ROM PROM in sitting, working only on flexion                  PT Short Term Goals - 04/04/17 1031      PT SHORT TERM GOAL #1   Title independent with initial HEP   Time 2   Period Weeks   Status New           PT Long Term Goals - 04/12/17 1104      PT LONG TERM GOAL #1   Title understand and perform RICE   Status Achieved     PT LONG TERM GOAL #2   Title decrease pain 50%   Status Partially Met               Plan -  04/15/17 1603    Clinical Impression Statement MD very pleased with pt R knee flexion, pt able to gather more quad activation and perform all interventions without assist. Pt reports pain wit MT at end range of flexion. Pt RLE feels like it can ben more but unable to due to pt c/o pain. Some heel slides with assist to gain more flexion.   Rehab Potential Good   PT Frequency 3x / week   PT Duration 8 weeks   PT Treatment/Interventions ADLs/Self Care Home Management;Cryotherapy;Electrical Stimulation;Gait training;Stair training;Functional mobility training;Patient/family education;Balance training;Therapeutic exercise;Therapeutic activities;Vasopneumatic Device;Manual techniques   PT Next Visit Plan slowly work on ROM and mm activation      Patient will benefit from skilled therapeutic intervention in order to improve the following deficits and impairments:  Abnormal gait, Decreased activity tolerance, Decreased balance, Decreased mobility, Decreased strength, Impaired sensation, Decreased scar mobility, Pain, Decreased range of motion, Difficulty walking  Visit Diagnosis: Stiffness of right knee, not elsewhere classified  Acute pain of right knee  Difficulty in walking, not elsewhere classified  Localized swelling, mass and lump, right lower limb     Problem List Patient Active Problem List   Diagnosis Date Noted  . S/P right unicompartmental knee replacement 04/01/2017  . Obesity (BMI 30.0-34.9) 11/27/2012  . Hypothyroidism 09/13/2012  . Colitis 09/10/2012  . Hypertension 09/10/2012  . Rosacea 09/10/2012  . Thyroid cancer, follicular variant of papillary thyroid carcinoma 06/09/2012  . Multinodular goiter (nontoxic) 04/17/2012  . GERD 05/26/2009    Scot Jun, PTA 04/15/2017, 4:05 PM  [place], [place], [address] Phone: [phone]   Fax:  [phone]  Name: [name] MRN:  [id number] Date of Birth: 01/27/1956

## 2017-04-17 ENCOUNTER — Telehealth: Payer: Self-pay | Admitting: Gastroenterology

## 2017-04-17 ENCOUNTER — Ambulatory Visit: Payer: BLUE CROSS/BLUE SHIELD | Admitting: Physical Therapy

## 2017-04-17 NOTE — Telephone Encounter (Signed)
Spoke with patient and she states she is having problems with constipation due to pain medication. She has tried colace, enema and suppository with minimal results and lots of abdominal cramping. Suggested she try Miralax  BID today. She understands she may do up to 4 doses/day if needed. She will call back if this does not help.

## 2017-04-18 ENCOUNTER — Other Ambulatory Visit (HOSPITAL_COMMUNITY): Payer: Self-pay

## 2017-04-18 ENCOUNTER — Emergency Department (HOSPITAL_COMMUNITY): Payer: BLUE CROSS/BLUE SHIELD

## 2017-04-18 ENCOUNTER — Inpatient Hospital Stay (HOSPITAL_COMMUNITY)
Admission: EM | Admit: 2017-04-18 | Discharge: 2017-04-24 | DRG: 392 | Disposition: A | Discharge status: Home / Self Care | Payer: BLUE CROSS/BLUE SHIELD | Attending: Internal Medicine | Admitting: Internal Medicine

## 2017-04-18 ENCOUNTER — Encounter (HOSPITAL_COMMUNITY): Payer: Self-pay | Admitting: Emergency Medicine

## 2017-04-18 DIAGNOSIS — K5792 Diverticulitis of intestine, part unspecified, without perforation or abscess without bleeding: Secondary | ICD-10-CM | POA: Diagnosis not present

## 2017-04-18 DIAGNOSIS — Z8249 Family history of ischemic heart disease and other diseases of the circulatory system: Secondary | ICD-10-CM

## 2017-04-18 DIAGNOSIS — I10 Essential (primary) hypertension: Secondary | ICD-10-CM | POA: Diagnosis present

## 2017-04-18 DIAGNOSIS — L719 Rosacea, unspecified: Secondary | ICD-10-CM | POA: Diagnosis present

## 2017-04-18 DIAGNOSIS — G43009 Migraine without aura, not intractable, without status migrainosus: Secondary | ICD-10-CM

## 2017-04-18 DIAGNOSIS — K219 Gastro-esophageal reflux disease without esophagitis: Secondary | ICD-10-CM | POA: Diagnosis present

## 2017-04-18 DIAGNOSIS — K5903 Drug induced constipation: Secondary | ICD-10-CM | POA: Diagnosis not present

## 2017-04-18 DIAGNOSIS — E890 Postprocedural hypothyroidism: Secondary | ICD-10-CM | POA: Diagnosis present

## 2017-04-18 DIAGNOSIS — E876 Hypokalemia: Secondary | ICD-10-CM | POA: Diagnosis present

## 2017-04-18 DIAGNOSIS — R1032 Left lower quadrant pain: Secondary | ICD-10-CM | POA: Diagnosis not present

## 2017-04-18 DIAGNOSIS — N3281 Overactive bladder: Secondary | ICD-10-CM | POA: Diagnosis present

## 2017-04-18 DIAGNOSIS — E669 Obesity, unspecified: Secondary | ICD-10-CM | POA: Diagnosis present

## 2017-04-18 DIAGNOSIS — Z90710 Acquired absence of both cervix and uterus: Secondary | ICD-10-CM

## 2017-04-18 DIAGNOSIS — Z96653 Presence of artificial knee joint, bilateral: Secondary | ICD-10-CM | POA: Diagnosis present

## 2017-04-18 DIAGNOSIS — K589 Irritable bowel syndrome without diarrhea: Secondary | ICD-10-CM | POA: Diagnosis present

## 2017-04-18 DIAGNOSIS — K5732 Diverticulitis of large intestine without perforation or abscess without bleeding: Principal | ICD-10-CM | POA: Diagnosis present

## 2017-04-18 DIAGNOSIS — Z96651 Presence of right artificial knee joint: Secondary | ICD-10-CM | POA: Diagnosis not present

## 2017-04-18 DIAGNOSIS — E039 Hypothyroidism, unspecified: Secondary | ICD-10-CM | POA: Diagnosis present

## 2017-04-18 DIAGNOSIS — K529 Noninfective gastroenteritis and colitis, unspecified: Secondary | ICD-10-CM

## 2017-04-18 DIAGNOSIS — Z6834 Body mass index (BMI) 34.0-34.9, adult: Secondary | ICD-10-CM

## 2017-04-18 DIAGNOSIS — T402X5A Adverse effect of other opioids, initial encounter: Secondary | ICD-10-CM | POA: Diagnosis not present

## 2017-04-18 DIAGNOSIS — R1084 Generalized abdominal pain: Secondary | ICD-10-CM | POA: Diagnosis not present

## 2017-04-18 DIAGNOSIS — Z79899 Other long term (current) drug therapy: Secondary | ICD-10-CM

## 2017-04-18 DIAGNOSIS — K5900 Constipation, unspecified: Secondary | ICD-10-CM

## 2017-04-18 DIAGNOSIS — Z833 Family history of diabetes mellitus: Secondary | ICD-10-CM

## 2017-04-18 DIAGNOSIS — G4700 Insomnia, unspecified: Secondary | ICD-10-CM | POA: Diagnosis present

## 2017-04-18 DIAGNOSIS — Z881 Allergy status to other antibiotic agents status: Secondary | ICD-10-CM

## 2017-04-18 DIAGNOSIS — F419 Anxiety disorder, unspecified: Secondary | ICD-10-CM | POA: Diagnosis present

## 2017-04-18 HISTORY — DX: Diverticulitis of intestine, part unspecified, without perforation or abscess without bleeding: K57.92

## 2017-04-18 HISTORY — DX: Reserved for concepts with insufficient information to code with codable children: IMO0002

## 2017-04-18 HISTORY — DX: Family history of other specified conditions: Z84.89

## 2017-04-18 HISTORY — DX: Anxiety disorder, unspecified: F41.9

## 2017-04-18 LAB — I-STAT TROPONIN, ED: Troponin i, poc: 0 ng/mL (ref 0.00–0.08)

## 2017-04-18 LAB — BASIC METABOLIC PANEL
ANION GAP: 9 (ref 5–15)
BUN: 16 mg/dL (ref 6–20)
CHLORIDE: 103 mmol/L (ref 101–111)
CO2: 26 mmol/L (ref 22–32)
CREATININE: 0.84 mg/dL (ref 0.44–1.00)
Calcium: 9.8 mg/dL (ref 8.9–10.3)
GFR calc non Af Amer: >60 mL/min (ref >60)
Glucose, Bld: 110 mg/dL — ABNORMAL HIGH (ref 65–99)
POTASSIUM: 3.6 mmol/L (ref 3.5–5.1)
SODIUM: 138 mmol/L (ref 135–145)

## 2017-04-18 LAB — CBC
HEMATOCRIT: 40.2 % (ref 36.0–46.0)
HEMOGLOBIN: 13.5 g/dL (ref 12.0–15.0)
MCH: 29.9 pg (ref 26.0–34.0)
MCHC: 33.6 g/dL (ref 30.0–36.0)
MCV: 89.1 fL (ref 78.0–100.0)
PLATELETS: 404 10*3/uL — AB (ref 150–400)
RBC: 4.51 MIL/uL (ref 3.87–5.11)
RDW: 13.5 % (ref 11.5–15.5)
WBC: 11.3 10*3/uL — AB (ref 4.0–10.5)

## 2017-04-18 MED ORDER — IRBESARTAN 300 MG PO TABS
300.0000 mg | ORAL_TABLET | Freq: Every day | ORAL | Status: DC
  Administered 2017-04-19 – 2017-04-24 (×7): 300 mg via ORAL
  Filled 2017-04-18 (×7): qty 1

## 2017-04-18 MED ORDER — ASPIRIN-ACETAMINOPHEN-CAFFEINE 250-250-65 MG PO TABS
2.0000 | ORAL_TABLET | Freq: Three times a day (TID) | ORAL | Status: DC | PRN
  Administered 2017-04-18: 2 via ORAL
  Filled 2017-04-18: qty 3

## 2017-04-18 MED ORDER — LORAZEPAM 0.5 MG PO TABS
0.5000 mg | ORAL_TABLET | Freq: Two times a day (BID) | ORAL | Status: DC | PRN
  Administered 2017-04-19: 0.5 mg via ORAL
  Filled 2017-04-18 (×2): qty 1

## 2017-04-18 MED ORDER — ONDANSETRON HCL 4 MG/2ML IJ SOLN
4.0000 mg | Freq: Once | INTRAMUSCULAR | Status: AC
  Administered 2017-04-18: 4 mg via INTRAVENOUS
  Filled 2017-04-18: qty 2

## 2017-04-18 MED ORDER — CIPROFLOXACIN IN D5W 400 MG/200ML IV SOLN
400.0000 mg | Freq: Two times a day (BID) | INTRAVENOUS | Status: DC
  Administered 2017-04-19 – 2017-04-24 (×11): 400 mg via INTRAVENOUS
  Filled 2017-04-18 (×11): qty 200

## 2017-04-18 MED ORDER — HYOSCYAMINE SULFATE 0.125 MG SL SUBL
0.2500 mg | SUBLINGUAL_TABLET | Freq: Once | SUBLINGUAL | Status: AC
  Administered 2017-04-19: 0.25 mg via SUBLINGUAL
  Filled 2017-04-18: qty 2

## 2017-04-18 MED ORDER — HYDROMORPHONE HCL 1 MG/ML IJ SOLN
1.0000 mg | Freq: Once | INTRAMUSCULAR | Status: AC
  Administered 2017-04-18: 1 mg via INTRAVENOUS
  Filled 2017-04-18: qty 1

## 2017-04-18 MED ORDER — HYDROCODONE-ACETAMINOPHEN 7.5-325 MG PO TABS: 1.0000 | ORAL_TABLET | ORAL | Status: DC | PRN

## 2017-04-18 MED ORDER — METRONIDAZOLE IN NACL 5-0.79 MG/ML-% IV SOLN
500.0000 mg | Freq: Once | INTRAVENOUS | Status: AC
  Administered 2017-04-18: 500 mg via INTRAVENOUS
  Filled 2017-04-18: qty 100

## 2017-04-18 MED ORDER — SODIUM CHLORIDE 0.9 % IV SOLN
INTRAVENOUS | Status: DC
  Administered 2017-04-18 – 2017-04-22 (×5): via INTRAVENOUS

## 2017-04-18 MED ORDER — ZOLPIDEM TARTRATE 5 MG PO TABS
2.5000 mg | ORAL_TABLET | Freq: Every evening | ORAL | Status: DC | PRN
  Administered 2017-04-20 – 2017-04-23 (×3): 2.5 mg via ORAL
  Filled 2017-04-18 (×4): qty 1

## 2017-04-18 MED ORDER — ENOXAPARIN SODIUM 40 MG/0.4ML ~~LOC~~ SOLN
40.0000 mg | SUBCUTANEOUS | Status: DC
  Administered 2017-04-19 – 2017-04-24 (×6): 40 mg via SUBCUTANEOUS
  Filled 2017-04-18 (×6): qty 0.4

## 2017-04-18 MED ORDER — METRONIDAZOLE IN NACL 5-0.79 MG/ML-% IV SOLN
500.0000 mg | Freq: Three times a day (TID) | INTRAVENOUS | Status: DC
  Administered 2017-04-19 – 2017-04-24 (×17): 500 mg via INTRAVENOUS
  Filled 2017-04-18 (×16): qty 100

## 2017-04-18 MED ORDER — PANTOPRAZOLE SODIUM 20 MG PO TBEC
20.0000 mg | DELAYED_RELEASE_TABLET | Freq: Every day | ORAL | Status: DC
  Administered 2017-04-18 – 2017-04-24 (×7): 20 mg via ORAL
  Filled 2017-04-18 (×8): qty 1

## 2017-04-18 MED ORDER — POLYETHYLENE GLYCOL 3350 17 G PO PACK
17.0000 g | PACK | Freq: Two times a day (BID) | ORAL | Status: DC
  Administered 2017-04-19 (×2): 17 g via ORAL
  Filled 2017-04-18 (×2): qty 1

## 2017-04-18 MED ORDER — ENSURE ENLIVE PO LIQD: 237.0000 mL | Freq: Two times a day (BID) | ORAL | Status: DC

## 2017-04-18 MED ORDER — MAGNESIUM HYDROXIDE 400 MG/5ML PO SUSP
30.0000 mL | Freq: Once | ORAL | Status: DC
  Filled 2017-04-18: qty 30

## 2017-04-18 MED ORDER — KETOROLAC TROMETHAMINE 30 MG/ML IJ SOLN
30.0000 mg | Freq: Once | INTRAMUSCULAR | Status: AC
  Administered 2017-04-18: 30 mg via INTRAVENOUS
  Filled 2017-04-18: qty 1

## 2017-04-18 MED ORDER — ASPIRIN 81 MG PO CHEW
81.0000 mg | CHEWABLE_TABLET | Freq: Two times a day (BID) | ORAL | Status: DC
  Administered 2017-04-19 – 2017-04-24 (×11): 81 mg via ORAL
  Filled 2017-04-18 (×11): qty 1

## 2017-04-18 MED ORDER — ONDANSETRON HCL 4 MG/2ML IJ SOLN
4.0000 mg | Freq: Four times a day (QID) | INTRAMUSCULAR | Status: DC | PRN
  Administered 2017-04-18 – 2017-04-23 (×6): 4 mg via INTRAVENOUS
  Filled 2017-04-18 (×6): qty 2

## 2017-04-18 MED ORDER — IOPAMIDOL (ISOVUE-300) INJECTION 61%
INTRAVENOUS | Status: AC
  Administered 2017-04-18: 100 mL
  Filled 2017-04-18: qty 100

## 2017-04-18 MED ORDER — ONDANSETRON HCL 4 MG PO TABS
4.0000 mg | ORAL_TABLET | Freq: Four times a day (QID) | ORAL | Status: DC | PRN
Start: 2017-04-18 — End: 2017-04-24

## 2017-04-18 MED ORDER — PROMETHAZINE HCL 25 MG/ML IJ SOLN
12.5000 mg | Freq: Three times a day (TID) | INTRAMUSCULAR | Status: DC | PRN
  Administered 2017-04-18: 25 mg via INTRAVENOUS
  Filled 2017-04-18: qty 1

## 2017-04-18 MED ORDER — SODIUM CHLORIDE 0.9 % IV BOLUS (SEPSIS)
1000.0000 mL | Freq: Once | INTRAVENOUS | Status: AC
  Administered 2017-04-18: 1000 mL via INTRAVENOUS

## 2017-04-18 MED ORDER — METHOCARBAMOL 500 MG PO TABS
500.0000 mg | ORAL_TABLET | Freq: Four times a day (QID) | ORAL | Status: DC | PRN
  Administered 2017-04-21 – 2017-04-23 (×5): 500 mg via ORAL
  Filled 2017-04-18 (×6): qty 1

## 2017-04-18 MED ORDER — BOOST / RESOURCE BREEZE PO LIQD
1.0000 | Freq: Three times a day (TID) | ORAL | Status: DC
  Administered 2017-04-19 – 2017-04-24 (×14): 1 via ORAL

## 2017-04-18 MED ORDER — CIPROFLOXACIN IN D5W 400 MG/200ML IV SOLN
400.0000 mg | Freq: Once | INTRAVENOUS | Status: AC
  Administered 2017-04-18: 400 mg via INTRAVENOUS
  Filled 2017-04-18: qty 200

## 2017-04-18 MED ORDER — LEVOTHYROXINE SODIUM 50 MCG PO TABS
175.0000 ug | ORAL_TABLET | Freq: Every day | ORAL | Status: DC
  Administered 2017-04-19 – 2017-04-23 (×6): 175 ug via ORAL
  Filled 2017-04-18 (×6): qty 1

## 2017-04-18 NOTE — ED Notes (Signed)
Pt given water 

## 2017-04-18 NOTE — ED Provider Notes (Signed)
[facility] DEPT Provider Note   CSN: [id number] Arrival date & time: 04/18/17  0216     History   Chief Complaint Chief Complaint  Patient presents with  . Chest Pain  . Constipation    HPI [name] is a 61 y.o. female.  Patient presenting with a complaint of constipation. Patient with abdominal cramping and discomfort. Also with predominantly left lower quadrant pain. Symptoms made significantly worse with food. Patient has a history of IBS. Patient recently had ght knee replacement surgery. And has been on pain medicines. Surgery 1/2 weeks ago. States  that the knee is healing well. Patient had several calls into her primary care doctor with them suggesting a laxatives and stool softeners. [facility]. Did have a rectal exam without any significant stool in the rectum. And was recommended to go home and do an enema Patient with evening meal got severe pain so they came in for evaluation. Unfortunately patient had a very long stay in the ED overnight due to large patient numbers. Patient has had no symptom relief with the laxatives and stool softeners. In addition patient had acute abdominal series done yesterday which showed stool throughout the colon.      Past Medical History:  Diagnosis Date  . Acne rosacea   . Allergic rhinitis   . Anemia   . Colitis   . Diverticulosis   . GERD (gastroesophageal reflux disease)   . Goiter   . Hip bursitis   . Hypertension   . Hypothyroidism   . Migraines   . Multiple thyroid nodules   . Obesity   . Osteoarthritis   . Overactive bladder    carpal tunnel   . Shortness of breath    with exertion   . Tubular adenoma of colon 04/2011  . Wears glasses     Patient Active Problem List   Diagnosis Date Noted  . Diverticulitis 04/18/2017  . Constipation due to opioid therapy 04/18/2017  . Migraine headache without aura 04/18/2017  . Generalized abdominal pain   . S/P right  unicompartmental knee replacement 04/01/2017  . Obesity (BMI 30.0-34.9) 11/27/2012  . Hypothyroidism 09/13/2012  . Colitis 09/10/2012  . Hypertension 09/10/2012  . Rosacea 09/10/2012  . Thyroid cancer, follicular variant of papillary thyroid carcinoma 06/09/2012  . Multinodular goiter (nontoxic) 04/17/2012  . GERD 05/26/2009    Past Surgical History:  Procedure Laterality Date  . CAUTERY OF TURBINATES  1990's  . COLONOSCOPY    . GANGLION CYST EXCISION Right 06/25/2013   Procedure: RIGHT ANKLE GANGLION CYST EXCISION;  Surgeon: Wylene Simmer, MD;  Location: [facility];  Service: Orthopedics;  Laterality: Right;  . INCISION AND DRAINAGE OF WOUND Right 07/23/2013   Procedure: IRRIGATION AND DEBRIDEMENT OF RIGHT ANKLE;  Surgeon: Wylene Simmer, MD;  Location: [facility];  Service: Orthopedics;  Laterality: Right;  Esmark used for tourniquet--on at 1417, off at 1437.  Marland Kitchen KNEE ARTHROSCOPY  11/2010   Right  . NASAL SEPTUM SURGERY  1991  . PARTIAL KNEE ARTHROPLASTY Right 04/01/2017   Procedure: UNICOMPARTMENTAL RIGHT KNEE- Medially;  Surgeon: Paralee Cancel, MD;  Location: [facility];  Service: Orthopedics;  Laterality: Right;  90 mins  . partial knee replacement  04/2009   Left  . THYROIDECTOMY  05/08/2012   Procedure: THYROIDECTOMY;  Surgeon: Earnstine Regal, MD;  Location: [facility];  Service: General;  Laterality: N/A;  Total Thyriodectomy  . TOTAL ABDOMINAL HYSTERECTOMY W/ BILATERAL SALPINGOOPHORECTOMY  2003  . TUBAL LIGATION    . WOUND DEBRIDEMENT Right 07/27/14   ankle    OB History    No data available       Home Medications    Prior to Admission medications   Medication Sig Start Date End Date Taking? Authorizing Provider  aspirin 81 MG chewable tablet Chew 1 tablet (81 mg total) by mouth 2 (two) times daily. Take for 4 weeks. 04/01/17 05/01/17 Yes Babish, Rodman Key, PA-C  Calcium Carbonate-Vit D-Min (CALCIUM 1200 PO) Take 1,200 Units by mouth daily.   Yes [provider]  docusate sodium (COLACE) 100 MG capsule Take 1 capsule (100 mg total) by mouth 2 (two) times daily. 04/01/17  Yes Babish, Rodman Key, PA-C  ferrous sulfate (FERROUSUL) 325 (65 FE) MG tablet Take 1 tablet (325 mg total) by mouth 3 (three) times daily with meals. 04/01/17  Yes Babish, Rodman Key, PA-C  fluticasone (FLONASE) 50 MCG/ACT nasal spray Place 2 sprays into the nose daily as needed for allergies.    Yes [provider]  HYDROcodone-acetaminophen (NORCO) 7.5-325 MG tablet Take 1-2 tablets by mouth every 4 (four) hours as needed for moderate pain or severe pain. 04/01/17  Yes Babish, Rodman Key, PA-C  lansoprazole (PREVACID) 15 MG capsule Take 15 mg by mouth daily.   Yes [provider]  levothyroxine (SYNTHROID, LEVOTHROID) 175 MCG tablet Take 175 mcg by mouth at bedtime.   Yes [provider]  LORazepam (ATIVAN) 0.5 MG tablet Take 0.5 mg by mouth 2 (two) times daily as needed for anxiety.    Yes [provider]  methocarbamol (ROBAXIN) 500 MG tablet Take 1 tablet (500 mg total) by mouth every 6 (six) hours as needed for muscle spasms. 04/01/17  Yes Babish, Rodman Key, PA-C  metroNIDAZOLE (METROCREAM) 0.75 % cream Apply 1 application topically 2 (two) times daily.    Yes [provider]  polyethylene glycol (MIRALAX / GLYCOLAX) packet Take 17 g by mouth 2 (two) times daily. 04/01/17  Yes Babish, Rodman Key, PA-C  Probiotic Product (RESTORA) CAPS TAKE ONE CAPSULE BY MOUTH EVERY DAY (NEED APPT FOR FURTHER REFILLS) Patient taking differently: Take 1 capsule by mouth daily. TAKE ONE CAPSULE BY MOUTH EVERY DAY (NEED APPT FOR FURTHER REFILLS) 01/24/15  Yes Ladene Artist, MD  psyllium (REGULOID) 0.52 g capsule Take 2 capsules by mouth at bedtime.   Yes [provider]  valACYclovir (VALTREX) 500 MG tablet Take 1,000 mg by mouth 2 (two) times daily as needed (fever blisters).   Yes [provider]  valsartan (DIOVAN) 320 MG tablet Take 320 mg  by mouth at bedtime.   Yes [provider]  zolpidem (AMBIEN) 5 MG tablet Take 2.5 mg by mouth at bedtime as needed for sleep.   Yes [provider]  hyoscyamine (LEVSIN SL) 0.125 MG SL tablet Take 1 tablet (0.125 mg total) by mouth 3 (three) times daily before meals. Patient not taking: Reported on 04/18/2017 01/19/16   Ladene Artist, MD    Family History Family History  Problem Relation Age of Onset  . Colon cancer Maternal Grandfather   . Diabetes Mother   . Heart disease Mother   . Breast cancer Mother   . Pulmonary embolism Father   . Hypertension Sister     Social History Social History  Substance Use Topics  . Smoking status: Never Smoker  . Smokeless tobacco: Never Used  . Alcohol use No     Allergies   Cephalexin; Macrobid [nitrofurantoin macrocrystal]; and  Zithromax [azithromycin]   Review of Systems Review of Systems  Constitutional: Positive for fatigue.  HENT: Negative for congestion.   Eyes: Negative for visual disturbance.  Respiratory: Negative for shortness of breath.   Cardiovascular: Positive for chest pain.  Gastrointestinal: Positive for abdominal pain and nausea. Negative for diarrhea and vomiting.  Genitourinary: Negative for dysuria.  Musculoskeletal: Positive for back pain.  Skin: Positive for wound. Negative for rash.  Neurological: Negative for headaches.  Hematological: Does not bruise/bleed easily.  Psychiatric/Behavioral: Negative for confusion.     Physical Exam Updated Vital Signs BP 125/63   Pulse 63   Temp 98.7 F (37.1 C) (Oral)   Resp 16   SpO2 95%   Physical Exam  Constitutional: She is oriented to person, place, and time. She appears well-developed and well-nourished. No distress.  HENT:  Head: Normocephalic and atraumatic.  Mouth/Throat: Oropharynx is clear and moist.  Eyes: Pupils are equal, round, and reactive to light. Conjunctivae and EOM are normal.  Neck: Neck supple.  Cardiovascular: Normal  rate, regular rhythm and normal heart sounds.   Pulmonary/Chest: Effort normal and breath sounds normal.  Abdominal: Soft. Bowel sounds are normal. There is no tenderness.  Slight distention  Genitourinary:  Genitourinary Comments: Rectal exam not redone was just done last evening.  Musculoskeletal: Normal range of motion.  Neurological: She is alert and oriented to person, place, and time. No cranial nerve deficit. She exhibits normal muscle tone. Coordination normal.  Skin: Skin is warm.  Nursing note and vitals reviewed.    ED Treatments / Results  Labs (all labs ordered are listed, but only abnormal results are displayed) Labs Reviewed  BASIC METABOLIC PANEL - Abnormal; Notable for the following:       Result Value   Glucose, Bld 110 (*)    All other components within normal limits  CBC - Abnormal; Notable for the following:    WBC 11.3 (*)    Platelets 404 (*)    All other components within normal limits  HIV ANTIBODY (ROUTINE TESTING)  MAGNESIUM  PHOSPHORUS  CBC  BASIC METABOLIC PANEL  I-STAT TROPONIN, ED    EKG  EKG Interpretation  Date/Time:  Thursday April 18 2017 02:17:32 EDT Ventricular Rate:  87 PR Interval:  126 QRS Duration: 82 QT Interval:  360 QTC Calculation: 433 R Axis:   -23 Text Interpretation:  Normal sinus rhythm Cannot rule out Anterior infarct , age undetermined Abnormal ECG Confirmed by Fredia Sorrow [id number]) on 04/18/2017 10:47:29 AM       Radiology Ct Abdomen Pelvis W Contrast  Result Date: 04/18/2017 CLINICAL DATA:  Abdominal pain and discomfort.  Recent surgery. EXAM: CT ABDOMEN AND PELVIS WITH CONTRAST TECHNIQUE: Multidetector CT imaging of the abdomen and pelvis was performed using the standard protocol following bolus administration of intravenous contrast. CONTRAST:  100 cc Isovue-300 intravenous contrast. COMPARISON:  CT abdomen and pelvis dated September 10, 2012. FINDINGS: Lower chest: Bibasilar atelectasis.  No acute abnormality.  Hepatobiliary: No focal liver abnormality is seen. No gallstones, gallbladder wall thickening, or biliary dilatation. Pancreas: Unremarkable. No pancreatic ductal dilatation or surrounding inflammatory changes. Spleen: Normal in size without focal abnormality. Adrenals/Urinary Tract: Adrenal glands are unremarkable. Kidneys are normal, without renal calculi, focal lesion, or hydronephrosis. Bladder is unremarkable. Stomach/Bowel: Extensive diverticulosis with long segment prominent wall thickening and pericolonic fat stranding of the distal descending and proximal sigmoid colon. No pneumoperitoneum or drainable fluid collection. The stomach and small bowel are unremarkable.  No obstruction. Vascular/Lymphatic: No  significant vascular findings are present. No enlarged abdominal or pelvic lymph nodes. Reproductive: Status post hysterectomy. No adnexal masses. Other: No abdominal wall hernia or abnormality. Trace free fluid in the left paracolic gutter and pelvis. Musculoskeletal: No acute or significant osseous findings. IMPRESSION: 1. Long segment wall thickening and pericolonic fat stranding of the distal descending and proximal sigmoid colon, favored to represent acute uncomplicated diverticulitis given the presence of several inflamed diverticula. The length of the involved colon, however, is greater than typical for diverticulitis, and additional etiologies such as infectious or inflammatory colitis should be considered. No evidence of perforation or drainable fluid collection. 2. No evidence of bowel obstruction as clinically queried. Electronically Signed   By: Titus Dubin M.D.   On: 04/18/2017 14:33    Procedures Procedures (including critical care time)  Medications Ordered in ED Medications  0.9 %  sodium chloride infusion ( Intravenous New Bag/Given 04/18/17 1133)  aspirin-acetaminophen-caffeine (EXCEDRIN MIGRAINE) per tablet 2-3 tablet (2 tablets Oral Given 04/18/17 1817)  aspirin chewable tablet  81 mg (not administered)  HYDROcodone-acetaminophen (NORCO) 7.5-325 MG per tablet 1-2 tablet (not administered)  levothyroxine (SYNTHROID, LEVOTHROID) tablet 175 mcg (not administered)  methocarbamol (ROBAXIN) tablet 500 mg (not administered)  irbesartan (AVAPRO) tablet 300 mg (not administered)  zolpidem (AMBIEN) tablet 2.5 mg (not administered)  LORazepam (ATIVAN) tablet 0.5 mg (not administered)  pantoprazole (PROTONIX) EC tablet 20 mg (not administered)  polyethylene glycol (MIRALAX / GLYCOLAX) packet 17 g (not administered)  magnesium hydroxide (MILK OF MAGNESIA) suspension 30 mL (not administered)  enoxaparin (LOVENOX) injection 40 mg (not administered)  ondansetron (ZOFRAN) tablet 4 mg ( Oral See Alternative 04/18/17 1648)    Or  ondansetron (ZOFRAN) injection 4 mg (4 mg Intravenous Given 04/18/17 1648)  promethazine (PHENERGAN) injection 12.5-25 mg (not administered)  sodium chloride 0.9 % bolus 1,000 mL (0 mLs Intravenous Stopped 04/18/17 1222)  ondansetron (ZOFRAN) injection 4 mg (4 mg Intravenous Given 04/18/17 1107)  HYDROmorphone (DILAUDID) injection 1 mg (1 mg Intravenous Given 04/18/17 1108)  iopamidol (ISOVUE-300) 61 % injection (100 mLs  Contrast Given 04/18/17 1240)  HYDROmorphone (DILAUDID) injection 1 mg (1 mg Intravenous Given 04/18/17 1323)  ciprofloxacin (CIPRO) IVPB 400 mg (0 mg Intravenous Stopped 04/18/17 1635)  metroNIDAZOLE (FLAGYL) IVPB 500 mg (0 mg Intravenous Stopped 04/18/17 1635)  HYDROmorphone (DILAUDID) injection 1 mg (1 mg Intravenous Given 04/18/17 1641)     Initial Impression / Assessment and Plan / ED Course  I have reviewed the triage vital signs and the nursing notes.  Pertinent labs & imaging results that were available during my care of the patient were reviewed by me and considered in my medical decision making (see chart for details).     CT scan was very revealing. Does show stool throughout the colon but no signs of obstruction no evidence of impaction.  Does show evidence of inflammation of left side of the colon could be consistent with diverticulitis as there is inflammation of some diverticulum also could just be a colitis.Does not appear that there is any significant constipation toward the patient's symptoms. Patient also has a slight leukoctosis.  Discussed with the hospitalist they will admit. I started her on the Cipro and Flagyl  Patient will receive bowel rest and continue to get IV fluids.  Patient improved here with antinausea medicine and pain medicine.  Final Clinical Impressions(s) / ED Diagnoses   Final diagnoses:  Colitis, acute  Diverticulitis  Generalized abdominal pain  Constipation, unspecified constipation type  New Prescriptions New Prescriptions   No medications on file     Fredia Sorrow, MD 04/18/17 [phone]

## 2017-04-18 NOTE — ED Notes (Signed)
Pt taken to CT.

## 2017-04-18 NOTE — ED Notes (Signed)
Patient had xrays done at [facility] can be seen on [facility]

## 2017-04-18 NOTE — H&P (Signed)
History and Physical    [name] HKV:[id number] DOB: January 06, 1956 DOA: 04/18/2017  PCP: Harlan Stains, MD Patient coming from: Home  Chief Complaint: Abd pain  HPI: [name] is a 61 y.o. female with medical history significant of tubular adenoma of the colon under regular colonoscopy monitoring, diverticulosis, overactive bladder, obesity, migraines, hypothyroidism, hypertension, goiter, GERD, anemia. Patient presenting with complaints of abdominal pain. Gradual onset ever since undergoing surgery for right partial knee replacement on 04/01/2017. Prior to surgery patient would have a normal bowel movement once daily. After surgery bowel movements slowed down to small hard stool every other day and then over the last 3-4 days patient states she's on been able to pass small amounts of mucus. Patient states she developed a "twinge" feeling in her lower abdomen which became more progressive and sharp. Bandlike pain across entire lower abdomen. Episodes would come on after attempting to eat or drink and would last for hours. Relieved with bowel rest. Patient went to the [facility] walk in clinic the day prior to admission and underwent rectal exam with no stool found in the rectal vault. Patient was told to take MiraLAX, Colace and lots of water with no improvement in symptoms.  ED Course: Objective findings outlined below. Started on metronidazole and ciprofloxacin, IV fluids, and Dilaudid  Review of Systems: As per HPI otherwise all other systems reviewed and are negative  Ambulatory Status: Current restrictions secondary to right partial knee replacement performed on 04/01/2017.  Past Medical History:  Diagnosis Date  . Acne rosacea   . Allergic rhinitis   . Anemia   . Colitis   . Diverticulosis   . GERD (gastroesophageal reflux disease)   . Goiter   . Hip bursitis   . Hypertension   . Hypothyroidism   . Migraines   . Multiple thyroid nodules   . Obesity   . Osteoarthritis   .  Overactive bladder    carpal tunnel   . Shortness of breath    with exertion   . Tubular adenoma of colon 04/2011  . Wears glasses     Past Surgical History:  Procedure Laterality Date  . CAUTERY OF TURBINATES  1990's  . COLONOSCOPY    . GANGLION CYST EXCISION Right 06/25/2013   Procedure: RIGHT ANKLE GANGLION CYST EXCISION;  Surgeon: Wylene Simmer, MD;  Location: [facility];  Service: Orthopedics;  Laterality: Right;  . INCISION AND DRAINAGE OF WOUND Right 07/23/2013   Procedure: IRRIGATION AND DEBRIDEMENT OF RIGHT ANKLE;  Surgeon: Wylene Simmer, MD;  Location: [facility];  Service: Orthopedics;  Laterality: Right;  Esmark used for tourniquet--on at 1417, off at 1437.  Marland Kitchen KNEE ARTHROSCOPY  11/2010   Right  . NASAL SEPTUM SURGERY  1991  . PARTIAL KNEE ARTHROPLASTY Right 04/01/2017   Procedure: UNICOMPARTMENTAL RIGHT KNEE- Medially;  Surgeon: Paralee Cancel, MD;  Location: [facility];  Service: Orthopedics;  Laterality: Right;  90 mins  . partial knee replacement  04/2009   Left  . THYROIDECTOMY  05/08/2012   Procedure: THYROIDECTOMY;  Surgeon: Earnstine Regal, MD;  Location: [facility];  Service: General;  Laterality: N/A;  Total Thyriodectomy  . TOTAL ABDOMINAL HYSTERECTOMY W/ BILATERAL SALPINGOOPHORECTOMY  2003  . TUBAL LIGATION    . WOUND DEBRIDEMENT Right 07/27/14   ankle    Social History   Social History  . Marital status: Married    Spouse name: N/A  . Number of children: 3  . Years of education:  N/A   Occupational History  . Speech pathology    Social History Main Topics  . Smoking status: Never Smoker  . Smokeless tobacco: Never Used  . Alcohol use No  . Drug use: No  . Sexual activity: Not on file   Other Topics Concern  . Not on file   Social History Narrative  . No narrative on file    Allergies  Allergen Reactions  . Cephalexin Hives  . Macrobid [Nitrofurantoin Macrocrystal] Nausea And Vomiting and Other (See Comments)    Light headed    . Zithromax [Azithromycin] Other (See Comments)    Gi upset     Family History  Problem Relation Age of Onset  . Colon cancer Maternal Grandfather   . Diabetes Mother   . Heart disease Mother   . Breast cancer Mother   . Pulmonary embolism Father   . Hypertension Sister       Prior to Admission medications   Medication Sig Start Date End Date Taking? Authorizing Provider  aspirin 81 MG chewable tablet Chew 1 tablet (81 mg total) by mouth 2 (two) times daily. Take for 4 weeks. 04/01/17 05/01/17  Danae Orleans, PA-C  Calcium Carb-Cholecalciferol (CALCIUM 1000 + D PO) Take by mouth.    [provider]  Calcium Carb-Cholecalciferol (CALCIUM 600 + D PO) Take 1 capsule by mouth 2 (two) times daily.    [provider]  Cholecalciferol (VITAMIN D PO) Take 1 tablet by mouth daily.    [provider]  docusate sodium (COLACE) 100 MG capsule Take 1 capsule (100 mg total) by mouth 2 (two) times daily. 04/01/17   Danae Orleans, PA-C  ferrous sulfate (FERROUSUL) 325 (65 FE) MG tablet Take 1 tablet (325 mg total) by mouth 3 (three) times daily with meals. 04/01/17   Danae Orleans, PA-C  fluticasone (FLONASE) 50 MCG/ACT nasal spray Place 2 sprays into the nose daily as needed for allergies.     [provider]  HYDROcodone-acetaminophen (NORCO) 7.5-325 MG tablet Take 1-2 tablets by mouth every 4 (four) hours as needed for moderate pain or severe pain. 04/01/17   Danae Orleans, PA-C  hyoscyamine (LEVSIN SL) 0.125 MG SL tablet Take 1 tablet (0.125 mg total) by mouth 3 (three) times daily before meals. Patient taking differently: Take 0.125 mg by mouth 3 (three) times daily as needed for cramping.  01/19/16   Ladene Artist, MD  lansoprazole (PREVACID) 15 MG capsule Take 15 mg by mouth daily.    [provider]  levothyroxine (SYNTHROID, LEVOTHROID) 175 MCG tablet Take 175 mcg by mouth at bedtime.    [provider]  LORazepam (ATIVAN) 0.5 MG  tablet Take 0.5 mg by mouth 2 (two) times daily as needed for anxiety.     [provider]  methocarbamol (ROBAXIN) 500 MG tablet Take 1 tablet (500 mg total) by mouth every 6 (six) hours as needed for muscle spasms. 04/01/17   Danae Orleans, PA-C  metroNIDAZOLE (METROCREAM) 0.75 % cream Apply 1 application topically 2 (two) times daily.     [provider]  polyethylene glycol (MIRALAX / GLYCOLAX) packet Take 17 g by mouth 2 (two) times daily. 04/01/17   Danae Orleans, PA-C  Probiotic Product (RESTORA) CAPS TAKE ONE CAPSULE BY MOUTH EVERY DAY (NEED APPT FOR FURTHER REFILLS) Patient taking differently: Take 1 capsule by mouth daily. TAKE ONE CAPSULE BY MOUTH EVERY DAY (NEED APPT FOR FURTHER REFILLS) 01/24/15   Ladene Artist, MD  psyllium (REGULOID) 0.52  g capsule Take 2 capsules by mouth at bedtime.    [provider]  valACYclovir (VALTREX) 500 MG tablet Take 1,000 mg by mouth 2 (two) times daily as needed (fever blisters).    [provider]  valsartan (DIOVAN) 320 MG tablet Take 320 mg by mouth at bedtime.    [provider]  zolpidem (AMBIEN) 5 MG tablet Take 2.5 mg by mouth at bedtime as needed for sleep.    [provider]    Physical Exam: Vitals:   04/18/17 1500 04/18/17 1530 04/18/17 1600 04/18/17 1630  BP: 123/61 130/77 133/63 133/68  Pulse: 70 73 71 65  Resp:  18 16   Temp:      TempSrc:      SpO2: 94% 98% 96% 95%     General:  Appears calm and comfortable Eyes:  PERRL, EOMI, normal lids, iris ENT:  grossly normal hearing, lips & tongue, mmm Neck:  no LAD, masses  Cardiovascular:  RRR, no m/r/g. No LE edema.  Respiratory:  CTA bilaterally, no w/r/r. Normal respiratory effort. Abdomen:  Soft, nondistended, nontender to shallow palpation only minimal tenderness to deep palpation. Hypoactive bowel sounds. Skin:  Right knee with surgical scar without significant surrounding erythema, induration or tenderness. no rash or  induration seen on limited exam Musculoskeletal:  Mild right knee swelling consistent with postsurgical changes. Left lower extremity and bilateral upper extremities with normal range of motion and tone.  Psychiatric:  grossly normal mood and affect, speech fluent and appropriate, AOx3 Neurologic:  CN 2-12 grossly intact, moves all extremities in coordinated fashion, sensation intact  Labs on Admission: I have personally reviewed following labs and imaging studies  CBC:  Recent Labs Lab 04/18/17 0226  WBC 11.3*  HGB 13.5  HCT 40.2  MCV 89.1  PLT 188*   Basic Metabolic Panel:  Recent Labs Lab 04/18/17 0226  NA 138  K 3.6  CL 103  CO2 26  GLUCOSE 110*  BUN 16  CREATININE 0.84  CALCIUM 9.8   GFR: CrCl cannot be calculated (Unknown ideal weight.). Liver Function Tests: No results for input(s): AST, ALT, ALKPHOS, BILITOT, PROT, ALBUMIN in the last 168 hours. No results for input(s): LIPASE, AMYLASE in the last 168 hours. No results for input(s): AMMONIA in the last 168 hours. Coagulation Profile: No results for input(s): INR, PROTIME in the last 168 hours. Cardiac Enzymes: No results for input(s): CKTOTAL, CKMB, CKMBINDEX, TROPONINI in the last 168 hours. BNP (last 3 results) No results for input(s): PROBNP in the last 8760 hours. HbA1C: No results for input(s): HGBA1C in the last 72 hours. CBG: No results for input(s): GLUCAP in the last 168 hours. Lipid Profile: No results for input(s): CHOL, HDL, LDLCALC, TRIG, CHOLHDL, LDLDIRECT in the last 72 hours. Thyroid Function Tests: No results for input(s): TSH, T4TOTAL, FREET4, T3FREE, THYROIDAB in the last 72 hours. Anemia Panel: No results for input(s): VITAMINB12, FOLATE, FERRITIN, TIBC, IRON, RETICCTPCT in the last 72 hours. Urine analysis:    Component Value Date/Time   COLORURINE YELLOW 09/10/2012 0910   APPEARANCEUR CLEAR 09/10/2012 0910   LABSPEC 1.034 (H) 09/10/2012 0910   PHURINE 6.0 09/10/2012 0910    GLUCOSEU NEGATIVE 09/10/2012 0910   HGBUR SMALL (A) 09/10/2012 0910   BILIRUBINUR NEGATIVE 09/10/2012 0910   KETONESUR NEGATIVE 09/10/2012 0910   PROTEINUR NEGATIVE 09/10/2012 0910   UROBILINOGEN 0.2 09/10/2012 0910   NITRITE NEGATIVE 09/10/2012 0910   LEUKOCYTESUR NEGATIVE 09/10/2012 0910    Creatinine Clearance: CrCl cannot  be calculated (Unknown ideal weight.).  Sepsis Labs: @LABRCNTIP (procalcitonin:4,lacticidven:4) )No results found for this or any previous visit (from the past 240 hour(s)).   Radiological Exams on Admission: Ct Abdomen Pelvis W Contrast  Result Date: 04/18/2017 CLINICAL DATA:  Abdominal pain and discomfort.  Recent surgery. EXAM: CT ABDOMEN AND PELVIS WITH CONTRAST TECHNIQUE: Multidetector CT imaging of the abdomen and pelvis was performed using the standard protocol following bolus administration of intravenous contrast. CONTRAST:  100 cc Isovue-300 intravenous contrast. COMPARISON:  CT abdomen and pelvis dated September 10, 2012. FINDINGS: Lower chest: Bibasilar atelectasis.  No acute abnormality. Hepatobiliary: No focal liver abnormality is seen. No gallstones, gallbladder wall thickening, or biliary dilatation. Pancreas: Unremarkable. No pancreatic ductal dilatation or surrounding inflammatory changes. Spleen: Normal in size without focal abnormality. Adrenals/Urinary Tract: Adrenal glands are unremarkable. Kidneys are normal, without renal calculi, focal lesion, or hydronephrosis. Bladder is unremarkable. Stomach/Bowel: Extensive diverticulosis with long segment prominent wall thickening and pericolonic fat stranding of the distal descending and proximal sigmoid colon. No pneumoperitoneum or drainable fluid collection. The stomach and small bowel are unremarkable.  No obstruction. Vascular/Lymphatic: No significant vascular findings are present. No enlarged abdominal or pelvic lymph nodes. Reproductive: Status post hysterectomy. No adnexal masses. Other: No abdominal wall  hernia or abnormality. Trace free fluid in the left paracolic gutter and pelvis. Musculoskeletal: No acute or significant osseous findings. IMPRESSION: 1. Long segment wall thickening and pericolonic fat stranding of the distal descending and proximal sigmoid colon, favored to represent acute uncomplicated diverticulitis given the presence of several inflamed diverticula. The length of the involved colon, however, is greater than typical for diverticulitis, and additional etiologies such as infectious or inflammatory colitis should be considered. No evidence of perforation or drainable fluid collection. 2. No evidence of bowel obstruction as clinically queried. Electronically Signed   By: Titus Dubin M.D.   On: 04/18/2017 14:33    EKG: Independently reviewed. Sinus. No ACS. Unchanged from previous EKG in 2014.  Assessment/Plan Active Problems:   GERD   Colitis   Hypertension   Hypothyroidism   S/P right unicompartmental knee replacement   Diverticulitis   Constipation due to opioid therapy   Migraine headache without aura   Diverticulitis: Noted on CT as above. Consistent with presenting symptomatology. History of diverticular disease likely exacerbated by recent increasing stool burden from constipation. - Continue ciprofloxacin and Flagyl - IV fluids - Clear liquid diet - Follow-up with [facility] for recommendations on colonoscopy. Last colonoscopy 1-2 years ago.  Constipation: New problem for patient ever since undergoing surgery on 04/01/2017 for total knee replacement. KUB and CT scan consistent with large stool burden. - MiraLAX twice a day, milk of magnesia - As bowel pain/inflammation improves consider adding senna and eventually enema if needed.   Migraines:  - continue excedrine (pt to use home meds)  Partial Knee replacement: underwent surgery on 7/23. Recovering on schedule. - Patient to perform prone physical therapy at bedside - Continue Robaxin and pain medications  (patient is actively weaning herself down)  Hypothyroidism: -Base continue Synthroid  Hypertension: On valsartan at home - Exchange valsartan for Avapro due to recall  GERD: - Continue PPI  Insomnia:  -Continue Ambien    DVT prophylaxis: Lovenox  Code Status: full  Family Communication: husband  Disposition Plan: pending improvement in pain  Consults called: none  Admission status: obs    MERRELL, DAVID J MD [facility]  If 7PM-7AM, please contact night-coverage [email or web address] Password [facility]  04/18/2017, 4:39 PM

## 2017-04-18 NOTE — ED Notes (Signed)
Pt ambulated to rr

## 2017-04-18 NOTE — ED Triage Notes (Addendum)
Patient with known bowel impaction per patient, she had an xray at [place].  Patient had recent knee surgery and has been having trouble with her bowels due to the pain meds and the surgery which was 2 1/2 weeks ago.  Patient having pain in chest whenever she is having abdominal pain and spasms.  She has IBS and has been doing multiple OTC remedies such as enemas, colace and miralax without relief.

## 2017-04-18 NOTE — ED Notes (Signed)
Admitting MD in room. Pt to be on clear liquid diet and to get a dose of 1mg  dilaudid now, then PRN oral meds that she takes at home.

## 2017-04-18 NOTE — ED Notes (Signed)
Pharmacy just informed this RN that they do have excedrin migraine in stock.

## 2017-04-18 NOTE — Progress Notes (Signed)
New Admission Note:   Arrival Method: Bed Mental Orientation: A&O X4 Telemetry: Initiated Assessment: To be completed Skin: To be assessed IV: Clean, Dry, Intact Pain: 2/10 Safety Measures: To be signed Admission: Completed Unit Orientation: Patient has been orientated to the room, unit and staff.   Orders have been reviewed and implemented. Will continue to monitor the patient. Call light has been placed within reach and bed alarm has been activated.    Dixie Dials RN, BSN

## 2017-04-19 ENCOUNTER — Ambulatory Visit: Payer: BLUE CROSS/BLUE SHIELD | Admitting: Physical Therapy

## 2017-04-19 DIAGNOSIS — E039 Hypothyroidism, unspecified: Secondary | ICD-10-CM | POA: Diagnosis not present

## 2017-04-19 DIAGNOSIS — K5792 Diverticulitis of intestine, part unspecified, without perforation or abscess without bleeding: Secondary | ICD-10-CM | POA: Diagnosis not present

## 2017-04-19 DIAGNOSIS — T402X5A Adverse effect of other opioids, initial encounter: Secondary | ICD-10-CM | POA: Diagnosis not present

## 2017-04-19 DIAGNOSIS — E876 Hypokalemia: Secondary | ICD-10-CM | POA: Diagnosis not present

## 2017-04-19 DIAGNOSIS — K5903 Drug induced constipation: Secondary | ICD-10-CM | POA: Diagnosis not present

## 2017-04-19 DIAGNOSIS — I10 Essential (primary) hypertension: Secondary | ICD-10-CM | POA: Diagnosis not present

## 2017-04-19 DIAGNOSIS — K529 Noninfective gastroenteritis and colitis, unspecified: Secondary | ICD-10-CM | POA: Diagnosis not present

## 2017-04-19 LAB — HIV ANTIBODY (ROUTINE TESTING W REFLEX): HIV Screen 4th Generation wRfx: NONREACTIVE

## 2017-04-19 LAB — CBC
HEMATOCRIT: 37.1 % (ref 36.0–46.0)
Hemoglobin: 12 g/dL (ref 12.0–15.0)
MCH: 29.3 pg (ref 26.0–34.0)
MCHC: 32.3 g/dL (ref 30.0–36.0)
MCV: 90.5 fL (ref 78.0–100.0)
Platelets: 339 10*3/uL (ref 150–400)
RBC: 4.1 MIL/uL (ref 3.87–5.11)
RDW: 13.7 % (ref 11.5–15.5)
WBC: 8.1 10*3/uL (ref 4.0–10.5)

## 2017-04-19 LAB — BASIC METABOLIC PANEL
ANION GAP: 9 (ref 5–15)
BUN: 13 mg/dL (ref 6–20)
CO2: 23 mmol/L (ref 22–32)
Calcium: 8.6 mg/dL — ABNORMAL LOW (ref 8.9–10.3)
Chloride: 109 mmol/L (ref 101–111)
Creatinine, Ser: 0.7 mg/dL (ref 0.44–1.00)
GFR calc Af Amer: >60 mL/min (ref >60)
GFR calc non Af Amer: >60 mL/min (ref >60)
Glucose, Bld: 98 mg/dL (ref 65–99)
POTASSIUM: 3.7 mmol/L (ref 3.5–5.1)
Sodium: 141 mmol/L (ref 135–145)

## 2017-04-19 LAB — PHOSPHORUS: Phosphorus: 3.5 mg/dL (ref 2.5–4.6)

## 2017-04-19 LAB — MAGNESIUM: Magnesium: 2 mg/dL (ref 1.7–2.4)

## 2017-04-19 MED ORDER — PROMETHAZINE HCL 25 MG/ML IJ SOLN
12.5000 mg | Freq: Three times a day (TID) | INTRAMUSCULAR | Status: DC | PRN
  Administered 2017-04-21 – 2017-04-22 (×2): 12.5 mg via INTRAVENOUS
  Filled 2017-04-19 (×2): qty 1

## 2017-04-19 MED ORDER — IBUPROFEN 600 MG PO TABS
600.0000 mg | ORAL_TABLET | Freq: Four times a day (QID) | ORAL | Status: DC | PRN
  Administered 2017-04-19: 600 mg via ORAL
  Filled 2017-04-19: qty 1

## 2017-04-19 MED ORDER — HYDROCODONE-ACETAMINOPHEN 7.5-325 MG PO TABS
1.0000 | ORAL_TABLET | Freq: Four times a day (QID) | ORAL | Status: DC | PRN
  Administered 2017-04-19: 1 via ORAL
  Filled 2017-04-19: qty 1

## 2017-04-19 MED ORDER — HYDROCODONE-ACETAMINOPHEN 7.5-325 MG PO TABS
1.0000 | ORAL_TABLET | ORAL | Status: DC | PRN
  Administered 2017-04-19: 1 via ORAL
  Filled 2017-04-19: qty 2

## 2017-04-19 NOTE — Progress Notes (Signed)
PROGRESS NOTE   [name]  LEX:[id number]    DOB: 07-28-1956    DOA: 04/18/2017  PCP: Harlan Stains, MD   I have briefly reviewed patients previous medical records in [facility].  Brief Narrative:  61 year old female with PMH of tubular adenoma of the colon under regular colonoscopy surveillance, diverticulosis, overactive bladder, obesity, migraine, hypothyroid, HTN, GERD, anemia, status post right partial knee replacement 04/01/17, ongoing issues with constipation, altered bowel regimen, lower abdominal discomfort, related to opioid pain medications since surgery presented with abdominal pain, seen at [facility] walk-in clinic day prior to admission and no relief with home bowel regimen and fluids, admitted for suspected acute diverticulitis versus colitis. Improving.   Assessment & Plan:   Active Problems:   GERD   Colitis   Hypertension   Hypothyroidism   S/P right unicompartmental knee replacement   Diverticulitis   Constipation due to opioid therapy   Migraine headache without aura   1. Acute diverticulitis versus colitis: CT abdomen results reviewed. Treated with bowel rest, IV fluids, IV Cipro and Flagyl and pain management. Slowly improving. Continue clear liquids for additional day and then consider advancing diet as tolerated. May follow-up outpatient with GI regarding need for further colonoscopy. 2. Constipation/IBS: Constipation has been a major factor since her surgery 04/01/17. Minimize opioids if possible. Utilize NSAIDs for postop right knee discomfort. Bowel regimen. 3. Status post partial right knee replacement: By Dr. Alvan Dame on 04/01/17. No acute findings. PT evaluation. NSAIDs for pain. 4. Hypothyroid: Clinically euthyroid. Continue Synthroid. 5. Essential hypertension: Controlled. ARB changed due to recent recall. 6. GERD: PPI   DVT prophylaxis: Lovenox Code Status: Full Family Communication: None at bedside Disposition: DC home when medically  improved   Consultants:  None   Procedures:  None  Antimicrobials:  IV Cipro and Flagyl    Subjective: Feels better. Decreased abdominal discomfort/pain and nausea. Tolerated some clear liquids. Had 2 tiny BMs this morning. No fever or chills.  ROS: Right knee discomfort, recovering from recent surgery.  Objective:  Vitals:   04/19/17 0535 04/19/17 1023 04/19/17 1621 04/19/17 1654  BP: (!) 131/50 (!) 124/58 (!) 117/42 (!) 131/53  Pulse: 67 72 67 71  Resp: 16 17 18    Temp: 98 F (36.7 C) 98.6 F (37 C) 98.2 F (36.8 C)   TempSrc: Oral Oral Oral   SpO2: 96% 98% 98%   Weight:      Height:        Examination:  General exam: Pleasant middle-aged female lying comfortably propped up in bed. Respiratory system: Clear to auscultation. Respiratory effort normal. Cardiovascular system: S1 & S2 heard, RRR. No JVD, murmurs, rubs, gallops or clicks. No pedal edema. Telemetry: Sinus rhythm. Gastrointestinal system: Abdomen is nondistended, soft and nontender. No organomegaly or masses felt. Normal bowel sounds heard. Central nervous system: Alert and oriented. No focal neurological deficits. Extremities: Symmetric 5 x 5 power. Right knee surgical incision site healing well without acute findings. Skin: No rashes, lesions or ulcers Psychiatry: Judgement and insight appear normal. Mood & affect appropriate.     Data Reviewed: I have personally reviewed following labs and imaging studies  CBC:  Recent Labs Lab 04/18/17 0226 04/19/17 0349  WBC 11.3* 8.1  HGB 13.5 12.0  HCT 40.2 37.1  MCV 89.1 90.5  PLT 404* 449   Basic Metabolic Panel:  Recent Labs Lab 04/18/17 0226 04/19/17 0349  NA 138 141  K 3.6 3.7  CL 103 109  CO2 26 23  GLUCOSE 110* 98  BUN 16 13  CREATININE 0.84 0.70  CALCIUM 9.8 8.6*  MG  --  2.0  PHOS  --  3.5        Radiology Studies: Ct Abdomen Pelvis W Contrast  Result Date: 04/18/2017 CLINICAL DATA:  Abdominal pain and discomfort.   Recent surgery. EXAM: CT ABDOMEN AND PELVIS WITH CONTRAST TECHNIQUE: Multidetector CT imaging of the abdomen and pelvis was performed using the standard protocol following bolus administration of intravenous contrast. CONTRAST:  100 cc Isovue-300 intravenous contrast. COMPARISON:  CT abdomen and pelvis dated September 10, 2012. FINDINGS: Lower chest: Bibasilar atelectasis.  No acute abnormality. Hepatobiliary: No focal liver abnormality is seen. No gallstones, gallbladder wall thickening, or biliary dilatation. Pancreas: Unremarkable. No pancreatic ductal dilatation or surrounding inflammatory changes. Spleen: Normal in size without focal abnormality. Adrenals/Urinary Tract: Adrenal glands are unremarkable. Kidneys are normal, without renal calculi, focal lesion, or hydronephrosis. Bladder is unremarkable. Stomach/Bowel: Extensive diverticulosis with long segment prominent wall thickening and pericolonic fat stranding of the distal descending and proximal sigmoid colon. No pneumoperitoneum or drainable fluid collection. The stomach and small bowel are unremarkable.  No obstruction. Vascular/Lymphatic: No significant vascular findings are present. No enlarged abdominal or pelvic lymph nodes. Reproductive: Status post hysterectomy. No adnexal masses. Other: No abdominal wall hernia or abnormality. Trace free fluid in the left paracolic gutter and pelvis. Musculoskeletal: No acute or significant osseous findings. IMPRESSION: 1. Long segment wall thickening and pericolonic fat stranding of the distal descending and proximal sigmoid colon, favored to represent acute uncomplicated diverticulitis given the presence of several inflamed diverticula. The length of the involved colon, however, is greater than typical for diverticulitis, and additional etiologies such as infectious or inflammatory colitis should be considered. No evidence of perforation or drainable fluid collection. 2. No evidence of bowel obstruction as  clinically queried. Electronically Signed   By: Titus Dubin M.D.   On: 04/18/2017 14:33        Scheduled Meds: . aspirin  81 mg Oral BID  . enoxaparin (LOVENOX) injection  40 mg Subcutaneous Q24H  . feeding supplement  1 Container Oral TID BM  . irbesartan  300 mg Oral Daily  . levothyroxine  175 mcg Oral QHS  . magnesium hydroxide  30 mL Oral Once  . pantoprazole  20 mg Oral Daily  . polyethylene glycol  17 g Oral BID   Continuous Infusions: . sodium chloride 75 mL/hr at 04/18/17 2134  . ciprofloxacin 400 mg (04/19/17 1702)  . metronidazole Stopped (04/19/17 1640)     LOS: 0 days     Erez Mccallum, MD, FACP, FHM. [facility] Pager [phone]  If 7PM-7AM, please contact night-coverage [email or web address] Password [email or web address] 04/19/2017, 5:35 PM

## 2017-04-19 NOTE — Progress Notes (Signed)
Initial Nutrition Assessment  DOCUMENTATION CODES:   Obesity unspecified  INTERVENTION:   -Boost Breeze po TID, each supplement provides 250 kcal and 9 grams of protein -RD will follow for diet advancement and supplement as appropriate  NUTRITION DIAGNOSIS:   Inadequate oral intake related to altered GI function as evidenced by  (clear liquid diet).  GOAL:   Patient will meet greater than or equal to 90% of their needs  MONITOR:   PO intake, Supplement acceptance, Diet advancement, Labs, Weight trends, Skin, I & O's  REASON FOR ASSESSMENT:   Malnutrition Screening Tool    ASSESSMENT:   [name] is a 61 y.o. female with medical history significant of tubular adenoma of the colon under regular colonoscopy monitoring, diverticulosis, overactive bladder, obesity, migraines, hypothyroidism, hypertension, goiter, GERD, anemia. Patient presenting with complaints of abdominal pain.   Pt admitted with diverticulitis.   Spoke with pt, who reports decreased appetite over the past 6 weeks, secondary to undergoing rt knee replacement. Appetite has been extremely poor over the past week; pt endorses sensitivity to cold foods, reporting spasms when consuming. She tolerated chicken broth well this morning. Pt estimates she ate only a half of a peanut butter and jelly sandwich this week. Pt is very mindful of her diet and tries to select healthful, organic food items, due to IBS.   Pt shares that she lost "a few pounds" over the past 6 weeks. Wt has been stable over the past month.   Nutrition-Focused physical exam completed. Findings are no fat depletion, no muscle depletion, and no edema.   Pt anxious to advance diet at this time. RD discussed potential for diet progression, as well as education on soft/low fiber diet. Pt with no further questions at this time, but appreciative of visit. She is amenable to continue Boost Breeze supplements while on clear liquid diet.   Labs reviewed.    Diet Order:  Diet clear liquid Room service appropriate? Yes; Fluid consistency: Thin  Skin:   (closed lt knee incision)  Last BM:  04/19/17  Height:   Ht Readings from Last 1 Encounters:  04/18/17 5\' 5"  (1.651 m)    Weight:   Wt Readings from Last 1 Encounters:  04/18/17 198 lb 6.6 oz (90 kg)    Ideal Body Weight:  56.8 kg  BMI:  Body mass index is 33.02 kg/m.  Estimated Nutritional Needs:   Kcal:  1550-1750  Protein:  75-90 grams  Fluid:  1.5-1.7 L  EDUCATION NEEDS:   Education needs addressed  Gema Ringold A. Jimmye Norman, RD, LDN, CDE Pager: [phone] After hours Pager: [phone]

## 2017-04-19 NOTE — Progress Notes (Signed)
Patient is requesting PT order for recent knee surgery. MD notified. Orders placed. Will continue to monitor.

## 2017-04-20 ENCOUNTER — Encounter (HOSPITAL_COMMUNITY): Payer: Self-pay | Admitting: Internal Medicine

## 2017-04-20 DIAGNOSIS — Z90710 Acquired absence of both cervix and uterus: Secondary | ICD-10-CM | POA: Diagnosis not present

## 2017-04-20 DIAGNOSIS — N3281 Overactive bladder: Secondary | ICD-10-CM | POA: Diagnosis present

## 2017-04-20 DIAGNOSIS — Z79899 Other long term (current) drug therapy: Secondary | ICD-10-CM | POA: Diagnosis not present

## 2017-04-20 DIAGNOSIS — F419 Anxiety disorder, unspecified: Secondary | ICD-10-CM | POA: Diagnosis present

## 2017-04-20 DIAGNOSIS — E039 Hypothyroidism, unspecified: Secondary | ICD-10-CM | POA: Diagnosis not present

## 2017-04-20 DIAGNOSIS — E669 Obesity, unspecified: Secondary | ICD-10-CM | POA: Diagnosis present

## 2017-04-20 DIAGNOSIS — K5903 Drug induced constipation: Secondary | ICD-10-CM | POA: Diagnosis present

## 2017-04-20 DIAGNOSIS — K5900 Constipation, unspecified: Secondary | ICD-10-CM | POA: Diagnosis not present

## 2017-04-20 DIAGNOSIS — K589 Irritable bowel syndrome without diarrhea: Secondary | ICD-10-CM | POA: Diagnosis present

## 2017-04-20 DIAGNOSIS — Z8249 Family history of ischemic heart disease and other diseases of the circulatory system: Secondary | ICD-10-CM | POA: Diagnosis not present

## 2017-04-20 DIAGNOSIS — E876 Hypokalemia: Secondary | ICD-10-CM | POA: Diagnosis present

## 2017-04-20 DIAGNOSIS — Z833 Family history of diabetes mellitus: Secondary | ICD-10-CM | POA: Diagnosis not present

## 2017-04-20 DIAGNOSIS — Z6834 Body mass index (BMI) 34.0-34.9, adult: Secondary | ICD-10-CM | POA: Diagnosis not present

## 2017-04-20 DIAGNOSIS — Z86010 Personal history of colonic polyps: Secondary | ICD-10-CM | POA: Diagnosis not present

## 2017-04-20 DIAGNOSIS — G43009 Migraine without aura, not intractable, without status migrainosus: Secondary | ICD-10-CM | POA: Diagnosis present

## 2017-04-20 DIAGNOSIS — T402X5A Adverse effect of other opioids, initial encounter: Secondary | ICD-10-CM | POA: Diagnosis present

## 2017-04-20 DIAGNOSIS — K5732 Diverticulitis of large intestine without perforation or abscess without bleeding: Secondary | ICD-10-CM | POA: Diagnosis present

## 2017-04-20 DIAGNOSIS — L719 Rosacea, unspecified: Secondary | ICD-10-CM | POA: Diagnosis present

## 2017-04-20 DIAGNOSIS — K5792 Diverticulitis of intestine, part unspecified, without perforation or abscess without bleeding: Secondary | ICD-10-CM | POA: Diagnosis not present

## 2017-04-20 DIAGNOSIS — K219 Gastro-esophageal reflux disease without esophagitis: Secondary | ICD-10-CM | POA: Diagnosis present

## 2017-04-20 DIAGNOSIS — R1032 Left lower quadrant pain: Secondary | ICD-10-CM | POA: Diagnosis present

## 2017-04-20 DIAGNOSIS — Z96653 Presence of artificial knee joint, bilateral: Secondary | ICD-10-CM | POA: Diagnosis present

## 2017-04-20 DIAGNOSIS — I10 Essential (primary) hypertension: Secondary | ICD-10-CM | POA: Diagnosis present

## 2017-04-20 DIAGNOSIS — E890 Postprocedural hypothyroidism: Secondary | ICD-10-CM | POA: Diagnosis present

## 2017-04-20 DIAGNOSIS — G4700 Insomnia, unspecified: Secondary | ICD-10-CM | POA: Diagnosis present

## 2017-04-20 DIAGNOSIS — Z881 Allergy status to other antibiotic agents status: Secondary | ICD-10-CM | POA: Diagnosis not present

## 2017-04-20 DIAGNOSIS — K529 Noninfective gastroenteritis and colitis, unspecified: Secondary | ICD-10-CM | POA: Diagnosis not present

## 2017-04-20 MED ORDER — HYOSCYAMINE SULFATE 0.125 MG SL SUBL
0.2500 mg | SUBLINGUAL_TABLET | Freq: Four times a day (QID) | SUBLINGUAL | Status: DC | PRN
  Administered 2017-04-20 – 2017-04-21 (×3): 0.25 mg via SUBLINGUAL
  Filled 2017-04-20 (×6): qty 2

## 2017-04-20 MED ORDER — HYOSCYAMINE SULFATE 0.125 MG/ML PO SOLN: 0.2500 mg | Freq: Once | ORAL | Status: DC

## 2017-04-20 MED ORDER — HYDROMORPHONE HCL 1 MG/ML IJ SOLN
0.5000 mg | INTRAMUSCULAR | Status: DC | PRN
  Administered 2017-04-20 – 2017-04-22 (×8): 0.5 mg via INTRAVENOUS
  Filled 2017-04-20 (×8): qty 0.5

## 2017-04-20 MED ORDER — POLYETHYLENE GLYCOL 3350 17 G PO PACK
17.0000 g | PACK | Freq: Two times a day (BID) | ORAL | Status: DC
  Administered 2017-04-20 – 2017-04-24 (×7): 17 g via ORAL
  Filled 2017-04-20 (×7): qty 1

## 2017-04-20 MED ORDER — NAPROXEN 250 MG PO TABS
500.0000 mg | ORAL_TABLET | Freq: Two times a day (BID) | ORAL | Status: DC
  Filled 2017-04-20: qty 2

## 2017-04-20 NOTE — Consult Note (Signed)
Consultation  Referring Provider:  [facility] / D r [facility]innett Physician:  Harlan Stains, MD Primary Gastroenterologist:  Dr.Stark  Reason for Consultation:  Diverticulitis  HPI: [name] is a 61 y.o. female , known to Dr. Fuller Plan who was admitted on 04/18/2017 with acute abdominal pain. Patient had just undergone right knee replacement on 04/02/2017. She also has history of tubular adenomatous colon polyps, diverticulosis, IBS, migraines, hypothyroidism, hypertension, and GERD. Patient says that she developed some constipation after her knee surgery, and while she was taking muscle relaxers and pain medicine. About a week ago she developed some discomfort in her left lower quadrant and spasms. Since that time she has continued to have lower abdominal discomfort which progressed to the point of her staying in bed on Wednesday, 04/17/2017. She says she went to an [facility] walk[facility]in clinic, had abdominal films done which did show a lot of stool in her colon and was told to start on Dulcolax and MiraLAX. Later that evening after trying to eat dinner she developed increasing left lower quadrant pain which lasted for multiple hours constantly and her husband brought her to the emergency room. She had not had any fever or chills. She says she did not have a bowel movement all of last week. CT scan of the abdomen and pelvis done on 04/18/2017 shows extensive diverticulosis with a long segment of prominent wall thickening and pericolonic fat stranding of the distal descending and proximal sigmoid colon near a pneumoperitoneum or fluid collection  She was admitted, has been on clear liquids and on IV Cipro and IV Flagyl. She has not been given any narcotics for pain, and instead has been on naproxen. She says she still very uncomfortable, having spasms in her left lower quadrant. She was started on Levsin which has not helped. She did have a very small pellet-like stool this morning. She  has not had any bleeding. Labs are reviewed, WBC of 11.3 on admission down to 8.1.  Last colonoscopy May 2017 per Dr. Fuller Plan done for diarrhea showed multiple diverticuli in the descending and sigmoid colon and a few small diverticula in the descending colon. There was no evidence of colitis, no polyps. Random biopsies were done to rule out microscopic colitis and these were negative as well.   Past Medical History:  Diagnosis Date  . Acne rosacea   . Allergic rhinitis   . Anemia   . Anxiety   . Colitis   . Diverticulosis   . Family history of adverse reaction to anesthesia    "sister's BP drops way low" (04/18/2017)  . GERD (gastroesophageal reflux disease)   . Goiter   . Hip bursitis   . Hypertension   . Hypothyroidism   . Migraines    "maybe once/year" (04/18/2017)  . Multiple thyroid nodules   . Obesity   . Osteoarthritis    "knees" (04/18/2017)  . Overactive bladder    carpal tunnel   . Shortness of breath    with exertion   . Squamous cell carcinoma 03/2017   RLE  . Tubular adenoma of colon 04/2011  . Wears glasses     Past Surgical History:  Procedure Laterality Date  . CAUTERY OF TURBINATES  1990's  . COLONOSCOPY    . EXCISIONAL HEMORRHOIDECTOMY    . GANGLION CYST EXCISION Right 06/25/2013   Procedure: RIGHT ANKLE GANGLION CYST EXCISION;  Surgeon: Wylene Simmer, MD;  Location: [facility];  Service: Orthopedics;  Laterality: Right;  .  INCISION AND DRAINAGE OF WOUND Right 07/23/2013   Procedure: IRRIGATION AND DEBRIDEMENT OF RIGHT ANKLE;  Surgeon: Wylene Simmer, MD;  Location: [facility];  Service: Orthopedics;  Laterality: Right;  Esmark used for tourniquet--on at 1417, off at 1437.  Marland Kitchen KNEE ARTHROSCOPY Right 11/2010  . NASAL SEPTUM SURGERY  1991  . PARTIAL KNEE ARTHROPLASTY Right 04/01/2017   Procedure: UNICOMPARTMENTAL RIGHT KNEE- Medially;  Surgeon: Paralee Cancel, MD;  Location: [facility];  Service: Orthopedics;  Laterality: Right;  90 mins  .  PARTIAL KNEE ARTHROPLASTY Left 04/2009  . PARTIAL KNEE ARTHROPLASTY Right 04/01/2017  . THYROIDECTOMY  05/08/2012   Procedure: THYROIDECTOMY;  Surgeon: Earnstine Regal, MD;  Location: [facility];  Service: General;  Laterality: N/A;  Total Thyriodectomy  . TOTAL ABDOMINAL HYSTERECTOMY  2003   TOTAL ABDOMINAL HYSTERECTOMY W/ BILATERAL SALPINGOOPHORECTOMY  . TUBAL LIGATION    . WOUND DEBRIDEMENT Right 08/2013   ankle    Prior to Admission medications   Medication Sig Start Date End Date Taking? Authorizing Provider  aspirin 81 MG chewable tablet Chew 1 tablet (81 mg total) by mouth 2 (two) times daily. Take for 4 weeks. 04/01/17 05/01/17 Yes Babish, Rodman Key, PA-C  Calcium Carbonate-Vit D-Min (CALCIUM 1200 PO) Take 1,200 Units by mouth daily.   Yes [provider]  docusate sodium (COLACE) 100 MG capsule Take 1 capsule (100 mg total) by mouth 2 (two) times daily. 04/01/17  Yes Babish, Rodman Key, PA-C  ferrous sulfate (FERROUSUL) 325 (65 FE) MG tablet Take 1 tablet (325 mg total) by mouth 3 (three) times daily with meals. 04/01/17  Yes Babish, Rodman Key, PA-C  fluticasone (FLONASE) 50 MCG/ACT nasal spray Place 2 sprays into the nose daily as needed for allergies.    Yes [provider]  HYDROcodone-acetaminophen (NORCO) 7.5-325 MG tablet Take 1-2 tablets by mouth every 4 (four) hours as needed for moderate pain or severe pain. 04/01/17  Yes Babish, Rodman Key, PA-C  lansoprazole (PREVACID) 15 MG capsule Take 15 mg by mouth daily.   Yes [provider]  levothyroxine (SYNTHROID, LEVOTHROID) 175 MCG tablet Take 175 mcg by mouth at bedtime.   Yes [provider]  LORazepam (ATIVAN) 0.5 MG tablet Take 0.5 mg by mouth 2 (two) times daily as needed for anxiety.    Yes [provider]  methocarbamol (ROBAXIN) 500 MG tablet Take 1 tablet (500 mg total) by mouth every 6 (six) hours as needed for muscle spasms. 04/01/17  Yes Babish, Rodman Key, PA-C  metroNIDAZOLE (METROCREAM) 0.75 %  cream Apply 1 application topically 2 (two) times daily.    Yes [provider]  polyethylene glycol (MIRALAX / GLYCOLAX) packet Take 17 g by mouth 2 (two) times daily. 04/01/17  Yes Babish, Rodman Key, PA-C  Probiotic Product (RESTORA) CAPS TAKE ONE CAPSULE BY MOUTH EVERY DAY (NEED APPT FOR FURTHER REFILLS) Patient taking differently: Take 1 capsule by mouth daily. TAKE ONE CAPSULE BY MOUTH EVERY DAY (NEED APPT FOR FURTHER REFILLS) 01/24/15  Yes Ladene Artist, MD  psyllium (REGULOID) 0.52 g capsule Take 2 capsules by mouth at bedtime.   Yes [provider]  valACYclovir (VALTREX) 500 MG tablet Take 1,000 mg by mouth 2 (two) times daily as needed (fever blisters).   Yes [provider]  valsartan (DIOVAN) 320 MG tablet Take 320 mg by mouth at bedtime.   Yes [provider]  zolpidem (AMBIEN) 5 MG tablet Take 2.5 mg by mouth at bedtime as needed for sleep.   Yes [provider]  hyoscyamine (LEVSIN SL) 0.125 MG SL tablet Take 1 tablet (0.125 mg total) by mouth 3 (three) times daily before meals. Patient not taking: Reported on 04/18/2017 01/19/16   Ladene Artist, MD    Current Facility-Administered Medications  Medication Dose Route Frequency Provider Last Rate Last Dose  . 0.9 %  sodium chloride infusion   Intravenous Continuous Modena Jansky, MD 50 mL/hr at 04/19/17 1801    . aspirin chewable tablet 81 mg  81 mg Oral BID Waldemar Dickens, MD   81 mg at 04/19/17 2148  . ciprofloxacin (CIPRO) IVPB 400 mg  400 mg Intravenous Q12H Blount, Lolita Cram, NP   Stopped at 04/20/17 0600  . enoxaparin (LOVENOX) injection 40 mg  40 mg Subcutaneous Q24H Waldemar Dickens, MD   40 mg at 04/20/17 0935  . feeding supplement (BOOST / RESOURCE BREEZE) liquid 1 Container  1 Container Oral TID BM Waldemar Dickens, MD   1 Container at 04/19/17 2148  . HYDROcodone-acetaminophen (NORCO) 7.5-325 MG per tablet 1 tablet  1 tablet Oral Q6H PRN Modena Jansky, MD   1 tablet at  04/19/17 2148  . hyoscyamine (LEVSIN SL) SL tablet 0.25 mg  0.25 mg Sublingual Q6H PRN Schorr, Rhetta Mura, NP   0.25 mg at 04/20/17 0932  . irbesartan (AVAPRO) tablet 300 mg  300 mg Oral Daily Waldemar Dickens, MD   300 mg at 04/19/17 1035  . levothyroxine (SYNTHROID, LEVOTHROID) tablet 175 mcg  175 mcg Oral QHS Waldemar Dickens, MD   175 mcg at 04/19/17 2148  . LORazepam (ATIVAN) tablet 0.5 mg  0.5 mg Oral BID PRN Waldemar Dickens, MD   0.5 mg at 04/19/17 0112  . magnesium hydroxide (MILK OF MAGNESIA) suspension 30 mL  30 mL Oral Once Waldemar Dickens, MD      . methocarbamol (ROBAXIN) tablet 500 mg  500 mg Oral Q6H PRN Waldemar Dickens, MD      . metroNIDAZOLE (FLAGYL) IVPB 500 mg  500 mg Intravenous Q8H Blount, Xenia T, NP 100 mL/hr at 04/20/17 0853 500 mg at 04/20/17 0853  . naproxen (NAPROSYN) tablet 500 mg  500 mg Oral BID WC Hongalgi, Anand D, MD      . ondansetron (ZOFRAN) tablet 4 mg  4 mg Oral Q6H PRN Waldemar Dickens, MD       Or  . ondansetron [facility]) injection 4 mg  4 mg Intravenous Q6H PRN Waldemar Dickens, MD   4 mg at 04/18/17 2043  . pantoprazole (PROTONIX) EC tablet 20 mg  20 mg Oral Daily Waldemar Dickens, MD   20 mg at 04/19/17 1035  . promethazine (PHENERGAN) injection 12.5 mg  12.5 mg Intravenous Q8H PRN Hongalgi, Anand D, MD      . zolpidem (AMBIEN) tablet 2.5 mg  2.5 mg Oral QHS PRN Waldemar Dickens, MD   2.5 mg at 04/20/17 0234    Allergies as of 04/18/2017 - Review Complete 04/18/2017  Allergen Reaction Noted  . Cephalexin Hives   . Macrobid [nitrofurantoin macrocrystal] Nausea And Vomiting and Other (See Comments) 03/21/2017  . Zithromax [azithromycin] Other (See Comments) 04/25/2012    Family History  Problem Relation Age of Onset  . Colon cancer Maternal Grandfather   . Diabetes Mother   . Heart disease Mother   . Breast cancer Mother   . Pulmonary embolism Father   . Hypertension Sister     Social History   Social  History  . Marital status: Married     Spouse name: N/A  . Number of children: 3  . Years of education: N/A   Occupational History  . Speech pathology    Social History Main Topics  . Smoking status: Never Smoker  . Smokeless tobacco: Never Used  . Alcohol use No  . Drug use: No  . Sexual activity: Yes     Review of Systems: Pertinent positive and negative review of systems were noted in the above HPI section.  All other review of systems was otherwise negative. Pt is s/p Right Knee replacement 7/24 /2018  Physical Exam: Vital signs in last 24 hours: Temp:  [97.8 F (36.6 C)-98.2 F (36.8 C)] 98.2 F (36.8 C) (08/11 1030) Pulse Rate:  [67-78] 78 (08/11 1030) Resp:  [14-18] 16 (08/11 1030) BP: (117-139)/(42-69) 139/69 (08/11 1030) SpO2:  [96 %-98 %] 96 % (08/11 1030) Weight:  [204 lb 11.2 oz (92.9 kg)] 204 lb 11.2 oz (92.9 kg) (08/10 2316) Last BM Date: 04/19/17 General:   Alert,  Well-developed, well-nourished,older WF  pleasant and cooperative in NAD Head:  Normocephalic and atraumatic. Eyes:  Sclera clear, no icterus.   Conjunctiva pink. Ears:  Normal auditory acuity. Nose:  No deformity, discharge,  or lesions. Mouth:  No deformity or lesions.   Neck:  Supple; no masses or thyromegaly. Lungs:  Clear throughout to auscultation.   No wheezes, crackles, or rhonchi. Heart:  Regular rate and rhythm; no murmurs, clicks, rubs,  or gallops. Abdomen:  Soft,tender LLQ BS active,no palp mass or hsm, no guarding or rebound    Msk:  Symmetrical without gross deformities. . Pulses:  Normal pulses noted. Extremities:  Without clubbing or edema. Neurologic:  Alert and  oriented x4;  grossly normal neurologically. Skin:  Intact without significant lesions or rashes.. Psych:  Alert and cooperative. Normal mood and affect.  Lab Results:  Recent Labs  04/18/17 0226 04/19/17 0349  WBC 11.3* 8.1  HGB 13.5 12.0  HCT 40.2 37.1  PLT 404* 339   BMET  Recent Labs  04/18/17 0226 04/19/17 0349  NA 138 141  K  3.6 3.7  CL 103 109  CO2 26 23  GLUCOSE 110* 98  BUN 16 13  CREATININE 0.84 0.70  CALCIUM 9.8 8.6*      IMPRESSION:  #36  61 year old white female admitted 04/18/2017 with acute diverticulitis with CT scan showing a long segment of diverticulitis in the descending and sigmoid colon, uncomplicated. Patient continues to complain of pain and remains obstipated #2 status post recent right knee replacement 04/02/2017 #3 history of IBS #4 history of tubular adenomatous polyps, no polyps on last colonoscopy 2017    PLAN: #1 continue clear liquids today #2 continue IV Cipro and Flagyl #3 patient is uncomfortable, naproxen and Levsin alone not helping. We will order IV narcotics short-term- Hydrocodone ordered this am but pt says that was not helping at home #4  Restart Miralax  BID until she starts having BM's   Thanks, will follow with you  Amy Esterwood  04/20/2017, 11:31 AM     [facility] Attending   I have taken an interval history, reviewed the chart and examined the patient. I agree with the Advanced Practitioner's note, impression and recommendations.   I think this time and the last time (2014) she had diverticulitis and not colitis. No diarrhea then or now which goes against.  She feels much better after some Dilaudid.  Though thought important to avoid roughage  in diverticulitis MiraLax makes sense and risk of perforation with roughage is very low   Gatha Mayer, MD, [facility] [phone] (pager) 04/20/2017 1:09 PM

## 2017-04-20 NOTE — Progress Notes (Addendum)
PROGRESS NOTE   [name]  LFY:[id number]    DOB: 1956/02/06    DOA: 04/18/2017  PCP: Harlan Stains, MD   I have briefly reviewed patients previous medical records in [facility].  Brief Narrative:  61 year old female with PMH of tubular adenoma of the colon under regular colonoscopy surveillance, diverticulosis, overactive bladder, obesity, migraine, hypothyroid, HTN, GERD, anemia, status post right partial knee replacement 04/01/17, ongoing issues with constipation, altered bowel regimen, lower abdominal discomfort, related to opioid pain medications since surgery presented with abdominal pain, seen at [facility] walk-in clinic day prior to admission and no relief with home bowel regimen and fluids, admitted for Acute diverticulitis. [facility] consulted 8/11.   Assessment & Plan:   Active Problems:   GERD   Acute diverticulitis   Hypertension   Hypothyroidism   S/P right unicompartmental knee replacement   Diverticulitis   Constipation due to opioid therapy   Migraine headache without aura   1. Acute diverticulitis: CT abdomen results reviewed. Treated with bowel rest, IV fluids, IV Cipro and Flagyl and pain management. No significant improvement compared to yesterday and hence [facility] consulted 8/11 who recommended continuing above management, did not think that she had colitis, initiated brief IV Dilaudid d/t inadequate pain control and resumed MiraLAX for Obstipation. Continue clear liquids for today. May follow-up outpatient with GI regarding need for further colonoscopy. 2. Constipation/IBS: Constipation has been a major factor since her surgery 04/01/17. MiraLAX resumed. Follow. 3. Status post partial right knee replacement: By Dr. Alvan Dame on 04/01/17. No acute findings. PT evaluation. NSAIDs stopped by GI. Started IV Dilaudid. Patient reports aspirin 81 twice a day for DVT prophylaxis until outpatient follow-up with Dr. Alvan Dame. 4. Hypothyroid: Clinically euthyroid. Continue  Synthroid. 5. Essential hypertension: Controlled. ARB changed due to recent recall. 6. GERD: PPI 7. History of tubular adenomatous polyps: No polyps on last colonoscopy 2017.   DVT prophylaxis: Lovenox Code Status: Full Family Communication: None at bedside Disposition: DC home when medically improved   Consultants:  [facility]  Procedures:  None  Antimicrobials:  IV Cipro and Flagyl    Subjective: Seen this morning. No BM or flatus since yesterday. States that she did not feel better and maybe feels even slightly worse than yesterday with ongoing lower abdominal/predominantly left lower quadrant cramps and abdominal pain. No nausea or vomiting reported.  ROS: Prominent right knee discomfort, chronic since surgery.  Objective:  Vitals:   04/19/17 1654 04/19/17 2316 04/20/17 0551 04/20/17 1030  BP: (!) 131/53 (!) 137/52 (!) 131/59 139/69  Pulse: 71 78 72 78  Resp:  17 14 16   Temp:  97.8 F (36.6 C) 97.8 F (36.6 C) 98.2 F (36.8 C)  TempSrc:  Oral Oral Oral  SpO2:  97% 96% 96%  Weight:  92.9 kg (204 lb 11.2 oz)    Height:        Examination:  General exam: Pleasant middle-aged female lying comfortably propped up in bed. Respiratory system: Clear to auscultation. Respiratory effort normal. Cardiovascular system: S1 & S2 heard, RRR. No JVD, murmurs, rubs, gallops or clicks. No pedal edema.  Gastrointestinal system: Abdomen is nondistended, soft. Mild lower abdominal tenderness without peritoneal signs. No organomegaly or masses felt. Normal bowel sounds heard. Central nervous system: Alert and oriented. No focal neurological deficits. Extremities: Symmetric 5 x 5 power. Right knee surgical incision site healing well without acute findings. Skin: No rashes, lesions or ulcers Psychiatry: Judgement and insight appear normal. Mood & affect appropriate.  Data Reviewed: I have personally reviewed following labs and imaging studies  CBC:  Recent Labs Lab  04/18/17 0226 04/19/17 0349  WBC 11.3* 8.1  HGB 13.5 12.0  HCT 40.2 37.1  MCV 89.1 90.5  PLT 404* 086   Basic Metabolic Panel:  Recent Labs Lab 04/18/17 0226 04/19/17 0349  NA 138 141  K 3.6 3.7  CL 103 109  CO2 26 23  GLUCOSE 110* 98  BUN 16 13  CREATININE 0.84 0.70  CALCIUM 9.8 8.6*  MG  --  2.0  PHOS  --  3.5        Radiology Studies: No results found.      Scheduled Meds: . aspirin  81 mg Oral BID  . enoxaparin (LOVENOX) injection  40 mg Subcutaneous Q24H  . feeding supplement  1 Container Oral TID BM  . irbesartan  300 mg Oral Daily  . levothyroxine  175 mcg Oral QHS  . magnesium hydroxide  30 mL Oral Once  . pantoprazole  20 mg Oral Daily  . polyethylene glycol  17 g Oral BID   Continuous Infusions: . sodium chloride 50 mL/hr at 04/19/17 1801  . ciprofloxacin Stopped (04/20/17 0600)  . metronidazole Stopped (04/20/17 0955)     LOS: 0 days     HONGALGI,ANAND, MD, FACP, FHM. [facility] Pager [phone]  If 7PM-7AM, please contact night-coverage [email or web address] Password [email or web address] 04/20/2017, 2:22 PM

## 2017-04-20 NOTE — Evaluation (Signed)
Physical Therapy Evaluation Patient Details Name: [name] MRN: [id number] DOB: 12-15-1955 Today's Date: 04/20/2017   History of Present Illness  Pt is a 61 yo female admitted through the ED on 04/18/17 for abdominal pain. Pt was diagnosed with colitis. PMH significant for tubular adenoma of colon, diverticulosis, OAB, obesity, migraine, hypothryoid, HTN, GERD, anemia, partial TKA 04/01/17 on R.   Clinical Impression  Pt presents with the above diagnosis and below deficits for therapy evaluation. Prior to admission, pt lived with her husband in a two story home with main living environment on the first floor. Pt was recovering from a recent partial knee replacement. Pt requires supervision for gait, but is Mod I with bed mobility and transfers this session. Pt continues to benefit from acute PT follow-up in order to address the below deficits including ROM and strength prior to D/C back to outpatient therapy.     Follow Up Recommendations Outpatient PT    Equipment Recommendations  None recommended by PT    Recommendations for Other Services       Precautions / Restrictions Precautions Precautions: Fall Restrictions Weight Bearing Restrictions: No      Mobility  Bed Mobility Overal bed mobility: Modified Independent                Transfers Overall transfer level: Modified independent     Sit to Stand: Modified independent (Device/Increase time)         General transfer comment: Mod I from edge of bed and recliner  Ambulation/Gait Ambulation/Gait assistance: Supervision Ambulation Distance (Feet): 250 Feet Assistive device: None Gait Pattern/deviations: Step-through pattern;Decreased step length - left;Decreased stance time - right;Antalgic Gait velocity: decreased Gait velocity interpretation: Below normal speed for age/gender General Gait Details: Mild antalgic gait with min cues for heel strike and to flex knee during swing phase to normalize gait.    Stairs            Wheelchair Mobility    Modified Rankin (Stroke Patients Only)       Balance Overall balance assessment: Modified Independent                                           Pertinent Vitals/Pain Pain Assessment: 0-10 Pain Score: 5  Pain Location: R knee Pain Descriptors / Indicators: Sore Pain Intervention(s): Repositioned;Monitored during session;Patient requesting pain meds-RN notified;RN gave pain meds during session;Ice applied    Home Living Family/patient expects to be discharged to:: Private residence Living Arrangements: Spouse/significant other Available Help at Discharge: Family Type of Home: House Home Access: Stairs to enter Entrance Stairs-Rails: Right Entrance Stairs-Number of Steps: 5 Home Layout: Two level;Able to live on main level with bedroom/bathroom Home Equipment: Gilford Rile - 2 wheels      Prior Function Level of Independence: Independent         Comments: working as a Surveyor, quantity   Dominant Hand: Right    Extremity/Trunk Assessment   Upper Extremity Assessment Upper Extremity Assessment: Overall WFL for tasks assessed    Lower Extremity Assessment Lower Extremity Assessment: RLE deficits/detail RLE Deficits / Details: pt with decreased ROM and strength in right knee. Flexion mostly affected    Cervical / Trunk Assessment Cervical / Trunk Assessment: Normal  Communication   Communication: No difficulties  Cognition Arousal/Alertness: Awake/alert Behavior During Therapy: WFL for tasks assessed/performed Overall Cognitive Status: Within  Functional Limits for tasks assessed                                        General Comments      Exercises Total Joint Exercises Knee Flexion: AROM;Right;10 reps;Seated   Assessment/Plan    PT Assessment Patient needs continued PT services  PT Problem List Decreased strength;Decreased range of motion;Decreased activity  tolerance;Decreased mobility;Pain;Decreased knowledge of use of DME       PT Treatment Interventions Functional mobility training;Gait training;DME instruction;Therapeutic activities;Therapeutic exercise;Patient/family education;Stair training    PT Goals (Current goals can be found in the Care Plan section)  Acute Rehab PT Goals Patient Stated Goal: to get back to outpatient therapy PT Goal Formulation: With patient Time For Goal Achievement: 04/27/17 Potential to Achieve Goals: Good    Frequency Min 5X/week   Barriers to discharge        Co-evaluation               AM-PAC PT "6 Clicks" Daily Activity  Outcome Measure Difficulty turning over in bed (including adjusting bedclothes, sheets and blankets)?: None Difficulty moving from lying on back to sitting on the side of the bed? : None Difficulty sitting down on and standing up from a chair with arms (e.g., wheelchair, bedside commode, etc,.)?: A Little Help needed moving to and from a bed to chair (including a wheelchair)?: A Little Help needed walking in hospital room?: A Little Help needed climbing 3-5 steps with a railing? : A Little 6 Click Score: 20    End of Session Equipment Utilized During Treatment: Gait belt Activity Tolerance: Patient tolerated treatment well Patient left: in chair;with call bell/phone within reach Nurse Communication: Mobility status PT Visit Diagnosis: Difficulty in walking, not elsewhere classified (R26.2)    Time: 9735-3299 PT Time Calculation (min) (ACUTE ONLY): 42 min   Charges:   PT Evaluation $PT Eval Moderate Complexity: 1 Mod PT Treatments $Gait Training: 8-22 mins $Therapeutic Exercise: 8-22 mins   PT G Codes:   PT G-Codes **NOT FOR INPATIENT CLASS** Functional Assessment Tool Used: AM-PAC 6 Clicks Basic Mobility Functional Limitation: Mobility: Walking and moving around Mobility: Walking and Moving Around Current Status (M4268): At least 20 percent but less than 40  percent impaired, limited or restricted Mobility: Walking and Moving Around Goal Status [phone]): At least 1 percent but less than 20 percent impaired, limited or restricted    Scheryl Marten PT, DPT  [phone]   Shanon Rosser 04/20/2017, 1:36 PM

## 2017-04-21 DIAGNOSIS — K5900 Constipation, unspecified: Secondary | ICD-10-CM

## 2017-04-21 NOTE — Progress Notes (Addendum)
   Patient Name: [name] Date of Encounter: 04/21/2017, 10:55 AM    Subjective  Has 2 small bowel movements yesterday More pain overnight - got hyoscyamine again without relief and now has had Dilaudid which helped Tearful and slightly upset over problems and conflicting info about pain meds and that causing constipation and worsening her situation   Objective  BP 138/63 (BP Location: Left Arm)   Pulse 73   Temp 98.3 F (36.8 C) (Oral)   Resp 18   Ht 5\' 5"  (1.651 m)   Wt 204 lb 11.2 oz (92.9 kg)   SpO2 98%   BMI 34.06 kg/m  NAD but mildly anxious Eyes are anicteric abd is soft and nontender  Lab Results  Component Value Date   WBC 8.1 04/19/2017   HGB 12.0 04/19/2017   HCT 37.1 04/19/2017   MCV 90.5 04/19/2017   PLT 339 04/19/2017      Assessment and Plan  Acute diverticulitis Associated constipation Anxiety about health  DC hyoscyamine - not helpful and confusing to have as option She may need a higher dose and more frequent Dilaudid - ok by me if so - I explained that diverticulitis causes constipation  Stay on MiraLax We will see again tomorrow Hopefully she can mobilize more   Gatha Mayer, MD, [facility] [phone] (pager) 04/21/2017 10:55 AM

## 2017-04-21 NOTE — Progress Notes (Signed)
PROGRESS NOTE   [name]  [name]    DOB: Apr 09, 1956    DOA: 04/18/2017  PCP: Harlan Stains, MD   I have briefly reviewed patients previous medical records in [facility].  Brief Narrative:  61 year old female with PMH of tubular adenoma of the colon under regular colonoscopy surveillance, diverticulosis, overactive bladder, obesity, migraine, hypothyroid, HTN, GERD, anemia, status post right partial knee replacement 04/01/17, ongoing issues with constipation, altered bowel regimen, lower abdominal discomfort, related to opioid pain medications since surgery presented with abdominal pain, seen at [facility] walk-in clinic day prior to admission and no relief with home bowel regimen and fluids, admitted for Acute diverticulitis. [facility] consulted 8/11.   Assessment & Plan:   Active Problems:   GERD   Acute diverticulitis   Hypertension   Hypothyroidism   S/P right unicompartmental knee replacement   Diverticulitis   Constipation due to opioid therapy   Migraine headache without aura   Colitis   1. Acute diverticulitis: CT abdomen results reviewed. Treated with bowel rest, IV fluids, IV Cipro and Flagyl and pain management. Since she was not making adequate improvement, [facility] consulted 8/11 who recommended continuing above management, did not think that she had colitis, initiated brief IV Dilaudid d/t inadequate pain control and resumed MiraLAX for Obstipation. Continue clear liquids for today. May follow-up outpatient with GI regarding need for further colonoscopy. Advised her that she can have IV Dilaudid for current acute pain and clarified the ambiguity that she had re not using d/t s/e of constipation. 2. Constipation/IBS: Constipation has been a major factor since her surgery 04/01/17. MiraLAX resumed. Follow. 3. Status post partial right knee replacement: By Dr. Alvan Dame on 04/01/17. No acute findings. PT evaluation. NSAIDs stopped by GI. Started IV Dilaudid. Patient  reports aspirin 81 twice a day for DVT prophylaxis until outpatient follow-up with Dr. Alvan Dame. 4. Hypothyroid: Clinically euthyroid. Continue Synthroid. 5. Essential hypertension: Controlled. ARB changed due to recent recall. 6. GERD: PPI 7. History of tubular adenomatous polyps: No polyps on last colonoscopy 2017.   DVT prophylaxis: Lovenox Code Status: Full Family Communication: None at bedside Disposition: DC home when medically improved   Consultants:   GI  Procedures:  None  Antimicrobials:  IV Cipro and Flagyl    Subjective: Pain was controlled on IV Dilaudid that was initiated yesterday but for unknown reasons, did not get any Dilaudid overnight and hence this morning again in pain and tearful. Discussed with RN and advised that she can have IV Dilaudid when necessary for pain. Levsin has been discontinued by GI. Had 2 small BMs yesterday. No vomiting. Still on clear liquids.  ROS: Prominent right knee discomfort, chronic since surgery.  Objective:  Vitals:   04/20/17 1836 04/20/17 1957 04/21/17 0515 04/21/17 0942  BP: (!) 130/57 (!) 128/43 124/60 138/63  Pulse: 64 67 78 73  Resp: 16 17 17 18   Temp: 97.9 F (36.6 C) 98 F (36.7 C) 98 F (36.7 C) 98.3 F (36.8 C)  TempSrc: Oral   Oral  SpO2: 97% 97% 96% 98%  Weight:      Height:        Examination:  General exam: Pleasant middle-aged female lying comfortably propped up in bed.Upset and tearful due to inadequate pain control. Respiratory system: Clear to auscultation. Respiratory effort normal. Stable. Cardiovascular system: S1 & S2 heard, RRR. No JVD, murmurs, rubs, gallops or clicks. No pedal edema.  Gastrointestinal system: Abdomen is nondistended, soft & nontender. Normal bowel sounds  heard. Central nervous system: Alert and oriented. No focal neurological deficits. Stable. Extremities: Symmetric 5 x 5 power. Right knee surgical incision site healing well without acute findings. Stable. Skin: No  rashes, lesions or ulcers Psychiatry: Judgement and insight appear normal. Mood & affect appropriate.     Data Reviewed: I have personally reviewed following labs and imaging studies  CBC:  Recent Labs Lab 04/18/17 0226 04/19/17 0349  WBC 11.3* 8.1  HGB 13.5 12.0  HCT 40.2 37.1  MCV 89.1 90.5  PLT 404* 384   Basic Metabolic Panel:  Recent Labs Lab 04/18/17 0226 04/19/17 0349  NA 138 141  K 3.6 3.7  CL 103 109  CO2 26 23  GLUCOSE 110* 98  BUN 16 13  CREATININE 0.84 0.70  CALCIUM 9.8 8.6*  MG  --  2.0  PHOS  --  3.5        Radiology Studies: No results found.      Scheduled Meds: . aspirin  81 mg Oral BID  . enoxaparin (LOVENOX) injection  40 mg Subcutaneous Q24H  . feeding supplement  1 Container Oral TID BM  . irbesartan  300 mg Oral Daily  . levothyroxine  175 mcg Oral QHS  . pantoprazole  20 mg Oral Daily  . polyethylene glycol  17 g Oral BID   Continuous Infusions: . sodium chloride 50 mL/hr at 04/20/17 1713  . ciprofloxacin Stopped (04/21/17 0747)  . metronidazole 500 mg (04/21/17 1529)     LOS: 1 day     [name], MD, FACP, FHM. [facility] Pager [phone]  If 7PM-7AM, please contact night-coverage [email or web address] Password [email or web address] 04/21/2017, 3:42 PM

## 2017-04-22 DIAGNOSIS — E876 Hypokalemia: Secondary | ICD-10-CM

## 2017-04-22 DIAGNOSIS — Z86010 Personal history of colonic polyps: Secondary | ICD-10-CM

## 2017-04-22 LAB — BASIC METABOLIC PANEL
ANION GAP: 6 (ref 5–15)
BUN: 7 mg/dL (ref 6–20)
CHLORIDE: 106 mmol/L (ref 101–111)
CO2: 28 mmol/L (ref 22–32)
Calcium: 9 mg/dL (ref 8.9–10.3)
Creatinine, Ser: 0.82 mg/dL (ref 0.44–1.00)
Glucose, Bld: 113 mg/dL — ABNORMAL HIGH (ref 65–99)
POTASSIUM: 3.2 mmol/L — AB (ref 3.5–5.1)
SODIUM: 140 mmol/L (ref 135–145)

## 2017-04-22 LAB — CBC
HCT: 37.2 % (ref 36.0–46.0)
HEMOGLOBIN: 12.1 g/dL (ref 12.0–15.0)
MCH: 29.9 pg (ref 26.0–34.0)
MCHC: 32.5 g/dL (ref 30.0–36.0)
MCV: 91.9 fL (ref 78.0–100.0)
PLATELETS: 381 10*3/uL (ref 150–400)
RBC: 4.05 MIL/uL (ref 3.87–5.11)
RDW: 14.1 % (ref 11.5–15.5)
WBC: 6.6 10*3/uL (ref 4.0–10.5)

## 2017-04-22 MED ORDER — POTASSIUM CHLORIDE CRYS ER 20 MEQ PO TBCR
40.0000 meq | EXTENDED_RELEASE_TABLET | Freq: Once | ORAL | Status: AC
  Administered 2017-04-22: 40 meq via ORAL
  Filled 2017-04-22: qty 2

## 2017-04-22 MED ORDER — HYDROMORPHONE HCL 1 MG/ML IJ SOLN
0.5000 mg | INTRAMUSCULAR | Status: DC | PRN
  Administered 2017-04-22 – 2017-04-23 (×6): 0.5 mg via INTRAVENOUS
  Filled 2017-04-22 (×6): qty 0.5

## 2017-04-22 MED ORDER — HYOSCYAMINE SULFATE 0.125 MG SL SUBL
0.2500 mg | SUBLINGUAL_TABLET | Freq: Four times a day (QID) | SUBLINGUAL | Status: DC | PRN
  Administered 2017-04-22: 0.25 mg via SUBLINGUAL
  Filled 2017-04-22 (×2): qty 2

## 2017-04-22 NOTE — Progress Notes (Signed)
Patient stating that stomach muscle spasms are still strong despite being on Robaxin and increasing frequency of IV dilaudid.  Will monitor.  Sheliah Plane RN

## 2017-04-22 NOTE — Progress Notes (Signed)
Physical Therapy Treatment Patient Details Name: [name] MRN: [id number] DOB: [birth year] Today's Date: 04/22/2017    History of Present Illness Pt is a 61 yo female admitted through the ED on 04/18/17 for abdominal pain. Pt was diagnosed with colitis. PMH significant for tubular adenoma of colon, diverticulosis, OAB, obesity, migraine, hypothryoid, HTN, GERD, anemia, partial TKA 04/01/17 on R.     PT Comments    Continuing work on functional mobility and activity tolerance;  Very motivated to keep her knee rehab on track; Good demo of seated knee flexion stretch; Walking well without an assistive device, occasionally reaching out for handheld assist with turns and inclines; Safe to walk in the room and on the unit independently   Follow Up Recommendations  Outpatient PT     Equipment Recommendations  None recommended by PT    Recommendations for Other Services       Precautions / Restrictions Precautions Precautions: Fall Restrictions Weight Bearing Restrictions: No Other Position/Activity Restrictions: WBAT    Mobility  Bed Mobility Overal bed mobility: Independent                Transfers Overall transfer level: Independent                  Ambulation/Gait Ambulation/Gait assistance: Supervision Ambulation Distance (Feet): 500 Feet (greater than) Assistive device: None (and occasionally pushing IV pole for stability) Gait Pattern/deviations: Step-through pattern;Decreased step length - left;Decreased stance time - right;Antalgic Gait velocity: decreased   General Gait Details: Mild antalgic gait with min cues for heel strike and to flex knee during swing phase to normalize gait.    Stairs            Wheelchair Mobility    Modified Rankin (Stroke Patients Only)       Balance                                            Cognition Arousal/Alertness: Awake/alert Behavior During Therapy: WFL for tasks  assessed/performed Overall Cognitive Status: Within Functional Limits for tasks assessed                                        Exercises Total Joint Exercises Long Arc Quad: AROM;Right;10 reps;Seated Knee Flexion: AROM;AAROM;Right;5 reps;Seated (good use of LLE to assist RLE into greater flexion stretch)    General Comments        Pertinent Vitals/Pain Pain Assessment: 0-10 Pain Score: 6  Pain Location: R knee with bending to end range flexion; subsides quickly; Also with abdominal cramping that lasts 10-20 seconds; 4 instances of cramping during session Pain Descriptors / Indicators: Sore Pain Intervention(s): Monitored during session    Home Living                      Prior Function            PT Goals (current goals can now be found in the care plan section) Acute Rehab PT Goals Patient Stated Goal: to get back to outpatient therapy PT Goal Formulation: With patient Time For Goal Achievement: 04/27/17 Potential to Achieve Goals: Good Progress towards PT goals: Progressing toward goals    Frequency    Min 5X/week      PT Plan Current plan remains  appropriate    Co-evaluation              AM-PAC PT "6 Clicks" Daily Activity  Outcome Measure  Difficulty turning over in bed (including adjusting bedclothes, sheets and blankets)?: None Difficulty moving from lying on back to sitting on the side of the bed? : None Difficulty sitting down on and standing up from a chair with arms (e.g., wheelchair, bedside commode, etc,.)?: A Little Help needed moving to and from a bed to chair (including a wheelchair)?: None Help needed walking in hospital room?: None Help needed climbing 3-5 steps with a railing? : A Little 6 Click Score: 22    End of Session Equipment Utilized During Treatment: Gait belt Activity Tolerance: Patient tolerated treatment well Patient left: in chair;with call bell/phone within reach Nurse Communication: Mobility  status PT Visit Diagnosis: Difficulty in walking, not elsewhere classified (R26.2)     Time: 7341-9379 PT Time Calculation (min) (ACUTE ONLY): 24 min  Charges:  $Gait Training: 8-22 mins $Therapeutic Exercise: 8-22 mins                    G Codes:       Roney Marion, PT  [place] Pager [phone] Office [place] 04/22/2017, 1:17 PM

## 2017-04-22 NOTE — Progress Notes (Signed)
PROGRESS NOTE   [name]  DSK:[id number]    DOB: 10-25-1955    DOA: 04/18/2017  PCP: Robin Stains, MD   I have briefly reviewed patients previous medical records in [facility].  Brief Narrative:  61 year old female with PMH of tubular adenoma of the colon under regular colonoscopy surveillance, diverticulosis, overactive bladder, obesity, migraine, hypothyroid, HTN, GERD, anemia, status post right partial knee replacement 04/01/17, ongoing issues with constipation, altered bowel regimen, lower abdominal discomfort, related to opioid pain medications since surgery presented with abdominal pain, seen at [facility] walk-in clinic day prior to admission and no relief with home bowel regimen and fluids, admitted for Acute diverticulitis. [name] consulted 8/11. Improving slowly.   Assessment & Plan:   Active Problems:   GERD   Acute diverticulitis   Hypertension   Hypothyroidism   S/P right unicompartmental knee replacement   Diverticulitis   Constipation due to opioid therapy   Migraine headache without aura   Colitis   1. Acute diverticulitis: CT abdomen results reviewed. Treated with bowel rest, IV fluids, IV Cipro and Flagyl and pain management. [facility] was consulted. They did not think that she had colitis. Continued conservative treatment. IV when necessary Dilaudid added for pain controlled and MiraLAX for Obstipation. Remains on clear liquids. May follow-up outpatient with GI regarding need for further colonoscopy. Abdominal pain is controlled. No BM since 8/10 and not passing flatus. Slow to improve. Await GI follow-up. Advancing diet. Ambulated in the halls yesterday.  2. Constipation/IBS: Constipation has been a major factor since her surgery 04/01/17. MiraLAX resumed. Management as above. 3. Status post partial right knee replacement: By Dr. Alvan Dame on 04/01/17. No acute findings. PT evaluation. NSAIDs stopped by GI. Started IV Dilaudid. Patient reports aspirin 81 twice a  day for DVT prophylaxis until outpatient follow-up with Dr. Alvan Dame. Outpatient PT upon discharge. 4. Hypothyroid: Clinically euthyroid. Continue Synthroid. 5. Essential hypertension: Controlled. ARB changed due to recent recall. 6. GERD: PPI 7. History of tubular adenomatous polyps: No polyps on last colonoscopy 2017. 8. Hypokalemia: replace and follow.   DVT prophylaxis: Lovenox Code Status: Full Family Communication: None at bedside Disposition: DC home when medically improved   Consultants:  [facility]  Procedures:  None  Antimicrobials:  IV Cipro and Flagyl    Subjective: Abdominal pain controlled on Dilaudid. No BM since 8/10. No flatus. Does not feel abdominal bloating. No nausea or vomiting. States that she ambulated in the hall 3 times with family yesterday.  ROS: R knee pain controlled.  Objective:  Vitals:   04/21/17 1820 04/21/17 2126 04/22/17 0509 04/22/17 1000  BP: 140/60 (!) 132/53 (!) 141/53 (!) 125/51  Pulse: 76 69 83 70  Resp: 18 18 18 18   Temp: 98 F (36.7 C) 98.3 F (36.8 C) 98.7 F (37.1 C) 98 F (36.7 C)  TempSrc: Oral Oral Oral Oral  SpO2: 98% 98% 94% 96%  Weight:  94.1 kg (207 lb 6.4 oz)    Height:        Examination:  General exam: Pleasant middle-aged female lying comfortably propped up in bed. Looks much improved/feeling better. Respiratory system: Clear to auscultation. Respiratory effort normal. Stable without change. Cardiovascular system: S1 & S2 heard, RRR. No JVD, murmurs, rubs, gallops or clicks. No pedal edema. Stable.  Gastrointestinal system: Abdomen is nondistended, soft & nontender. Normal bowel sounds heard. Stable. Central nervous system: Alert and oriented. No focal neurological deficits. Stable. Extremities: Symmetric 5 x 5 power. Right knee surgical incision  site healing well without acute findings. Stable. Skin: No rashes, lesions or ulcers Psychiatry: Judgement and insight appear normal. Mood & affect appropriate.      Data Reviewed: I have personally reviewed following labs and imaging studies  CBC:  Recent Labs Lab 04/18/17 0226 04/19/17 0349 04/22/17 0440  WBC 11.3* 8.1 6.6  HGB 13.5 12.0 12.1  HCT 40.2 37.1 37.2  MCV 89.1 90.5 91.9  PLT 404* 339 177   Basic Metabolic Panel:  Recent Labs Lab 04/18/17 0226 04/19/17 0349 04/22/17 0440  NA 138 141 140  K 3.6 3.7 3.2*  CL 103 109 106  CO2 26 23 28   GLUCOSE 110* 98 113*  BUN 16 13 7   CREATININE 0.84 0.70 0.82  CALCIUM 9.8 8.6* 9.0  MG  --  2.0  --   PHOS  --  3.5  --         Radiology Studies: No results found.      Scheduled Meds: . aspirin  81 mg Oral BID  . enoxaparin (LOVENOX) injection  40 mg Subcutaneous Q24H  . feeding supplement  1 Container Oral TID BM  . irbesartan  300 mg Oral Daily  . levothyroxine  175 mcg Oral QHS  . pantoprazole  20 mg Oral Daily  . polyethylene glycol  17 g Oral BID   Continuous Infusions: . sodium chloride 50 mL/hr at 04/21/17 2012  . ciprofloxacin Stopped (04/22/17 1165)  . metronidazole Stopped (04/22/17 1001)     LOS: 2 days     Samuele Storey, MD, FACP, FHM. [facility] Pager [phone]  If 7PM-7AM, please contact night-coverage [email or web address] Password [email or web address] 04/22/2017, 1:18 PM

## 2017-04-22 NOTE — Progress Notes (Signed)
Daily Rounding Note  04/22/2017, 1:36 PM  LOS: 2 days   SUBJECTIVE:   Chief complaint:   Abdominal pain persists.  She is currently on the commode and having what she feels is her first, good-sized bowel movement in over a week. It remains to be seen whether good evacuation of her bowels improves her abdominal pain but overnight it has persisted despite pain meds.  OBJECTIVE:         Vital signs in last 24 hours:    Temp:  [98 F (36.7 C)-98.7 F (37.1 C)] 98 F (36.7 C) (08/13 1000) Pulse Rate:  [69-83] 70 (08/13 1000) Resp:  [18] 18 (08/13 1000) BP: (125-141)/(51-60) 125/51 (08/13 1000) SpO2:  [94 %-98 %] 96 % (08/13 1000) Weight:  [94.1 kg (207 lb 6.4 oz)] 94.1 kg (207 lb 6.4 oz) (08/12 2126) Last BM Date: 04/20/17 Filed Weights   04/18/17 2000 04/19/17 2316 04/21/17 2126  Weight: 90 kg (198 lb 6.6 oz) 92.9 kg (204 lb 11.2 oz) 94.1 kg (207 lb 6.4 oz)   General: Looks well. Alert. Comfortable   Heart: RRR. Chest: No dyspnea, no cough. Clear bilaterally. Abdomen: Soft. Not distended. Bowel sounds normoactive without tinkling or tympanitic bowel sounds. No tenderness. Of note the abdominal pain has never been elicited by physical exam  Extremities: Swelling and postsurgical deformity in the right knee with clean, dry, intact incision. Neuro/Psych:  Pleasant, calm, cooperative.  Intake/Output from previous day: 08/12 0701 - 08/13 0700 In: 2040 [P.O.:840; I.V.:600; IV Piggyback:600] Out: 1950 [Urine:1950]  Intake/Output this shift: Total I/O In: 240 [P.O.:240] Out: 0   Lab Results:  Recent Labs  04/22/17 0440  WBC 6.6  HGB 12.1  HCT 37.2  PLT 381   BMET  Recent Labs  04/22/17 0440  NA 140  K 3.2*  CL 106  CO2 28  GLUCOSE 113*  BUN 7  CREATININE 0.82  CALCIUM 9.0    Studies/Results: No results found.  Scheduled Meds: . aspirin  81 mg Oral BID  . enoxaparin (LOVENOX) injection  40 mg  Subcutaneous Q24H  . feeding supplement  1 Container Oral TID BM  . irbesartan  300 mg Oral Daily  . levothyroxine  175 mcg Oral QHS  . pantoprazole  20 mg Oral Daily  . polyethylene glycol  17 g Oral BID   Continuous Infusions: . sodium chloride 50 mL/hr at 04/21/17 2012  . ciprofloxacin Stopped (04/22/17 1610)  . metronidazole Stopped (04/22/17 1001)   PRN Meds:.HYDROmorphone (DILAUDID) injection, LORazepam, methocarbamol, ondansetron **OR** ondansetron (ZOFRAN) IV, promethazine, zolpidem   ASSESMENT:   *  Descending colon and sigmoid diverticulitis.  Day 5 IV cipro/flagyl.    *  Hx TA polyps. No recurrent polyps on colonoscopy 2017.  *  S/p 04/02/17 right partial TKR   PLAN   *  Agree with advancing her diet to full liquids. Continue antibiotics and supportive care.   Azucena Freed  04/22/2017, 1:36 PM Pager: [phone]     Attending physician's note   I have taken an interval history, reviewed the chart and examined the patient. I agree with the Advanced Practitioner's note, impression and recommendations. Continue IV antibiotics and Miralax bid for now. Advance diet to full liquids. Oral pain medications and oral antibiotics as pain improves. Outpatient, post hospital follow up with GI or her PCP, Dr. Harlan Stains. GI signing off. Please call if needed.   Lucio Edward, MD [facility] [phone] Mon-Fri 8a-5p [phone]  after 5p, weekends, holidays

## 2017-04-23 LAB — BASIC METABOLIC PANEL
Anion gap: 7 (ref 5–15)
BUN: 8 mg/dL (ref 6–20)
CHLORIDE: 107 mmol/L (ref 101–111)
CO2: 26 mmol/L (ref 22–32)
CREATININE: 0.83 mg/dL (ref 0.44–1.00)
Calcium: 8.6 mg/dL — ABNORMAL LOW (ref 8.9–10.3)
GFR calc non Af Amer: >60 mL/min (ref >60)
Glucose, Bld: 104 mg/dL — ABNORMAL HIGH (ref 65–99)
POTASSIUM: 3.7 mmol/L (ref 3.5–5.1)
SODIUM: 140 mmol/L (ref 135–145)

## 2017-04-23 NOTE — Progress Notes (Signed)
PROGRESS NOTE   [name]  ZOX:[id number]    DOB: 05/18/1956    DOA: 04/18/2017  PCP: Harlan Stains, MD   I have briefly reviewed patients previous medical records in [facility].  Brief Narrative:  61 year old female with PMH of tubular adenoma of the colon under regular colonoscopy surveillance, diverticulosis, overactive bladder, obesity, migraine, hypothyroid, HTN, GERD, anemia, status post right partial knee replacement 04/01/17, ongoing issues with constipation, altered bowel regimen, lower abdominal discomfort, related to opioid pain medications since surgery presented with abdominal pain, seen at [facility] walk-in clinic day prior to admission and no relief with home bowel regimen and fluids, admitted for Acute diverticulitis. [facility] consulted 8/11. Improving slowly.   Assessment & Plan:   Active Problems:   GERD   Acute diverticulitis   Hypertension   Hypothyroidism   S/P right unicompartmental knee replacement   Diverticulitis   Constipation due to opioid therapy   Migraine headache without aura   Colitis   1. Acute diverticulitis: CT abdomen results reviewed. Treated with bowel rest, IV fluids, IV Cipro and Flagyl and pain management. [facility] was consulted. They did not think that she had colitis. Continued conservative treatment. IV when necessary Dilaudid added for pain controlled and MiraLAX for Obstipation. May follow-up outpatient with GI regarding need for further colonoscopy. Clinically improving. Had multiple BMs yesterday and this morning. Abdominal pain significantly improved. Advance to soft diet and monitor overnight. If she continues to do well, possible discharge home 8/15. [facility] signed off 8/13. 2. Constipation/IBS: Constipation has been a major factor since her surgery 04/01/17. MiraLAX resumed. Management as above. Had 3 regular BMs yesterday and 3 loose BMs/watery BMs this morning. 3. Status post partial right knee replacement: By Dr. Alvan Dame on  04/01/17. No acute findings. PT evaluation. NSAIDs stopped by GI. Started IV Dilaudid. Patient reports aspirin 81 twice a day for DVT prophylaxis until outpatient follow-up with Dr. Alvan Dame. Outpatient PT upon discharge. 4. Hypothyroid: Clinically euthyroid. Continue Synthroid. 5. Essential hypertension: Controlled. ARB changed due to recent recall. 6. GERD: PPI 7. History of tubular adenomatous polyps: No polyps on last colonoscopy 2017. 8. Hypokalemia: replaced.   DVT prophylaxis: Lovenox Code Status: Full Family Communication: None at bedside Disposition: DC home when medically improved, possibly 8/15.    Consultants:  [facility]  Procedures:  None  Antimicrobials:  IV Cipro and Flagyl    Subjective: Reports feeling much better compared to yesterday. Abdominal pain resolved since this morning. 3 regular BMs yesterday and 3 loose/watery BMs this morning. Wants to try soft diet.  ROS: R knee pain controlled.  Objective:  Vitals:   04/22/17 1750 04/22/17 2050 04/23/17 0518 04/23/17 0857  BP: (!) 146/69 139/62 (!) 130/55 138/61  Pulse: 72 77 72 77  Resp: 18 19 17 18   Temp: 98.5 F (36.9 C) 98.2 F (36.8 C) 98.5 F (36.9 C) 98.5 F (36.9 C)  TempSrc: Oral Oral Oral Oral  SpO2: 98% 94% 95% 96%  Weight:  97.1 kg (214 lb 1.1 oz)    Height:        Examination:  General exam: Pleasant middle-aged female lying comfortably propped up in bed. Stable. Respiratory system: Clear to auscultation. Respiratory effort normal. Stable Cardiovascular system: S1 & S2 heard, RRR. No JVD, murmurs, rubs, gallops or clicks. No pedal edema. Stable Gastrointestinal system: Abdomen is nondistended, soft & nontender. Normal bowel sounds heard. Stable Central nervous system: Alert and oriented. No focal neurological deficits. Stable. Extremities: Symmetric 5  x 5 power. Right knee surgical incision site healing well without acute findings. Stable. Skin: No rashes, lesions or ulcers Psychiatry:  Judgement and insight appear normal. Mood & affect appropriate.     Data Reviewed: I have personally reviewed following labs and imaging studies  CBC:  Recent Labs Lab 04/18/17 0226 04/19/17 0349 04/22/17 0440  WBC 11.3* 8.1 6.6  HGB 13.5 12.0 12.1  HCT 40.2 37.1 37.2  MCV 89.1 90.5 91.9  PLT 404* 339 409   Basic Metabolic Panel:  Recent Labs Lab 04/18/17 0226 04/19/17 0349 04/22/17 0440 04/23/17 0453  NA 138 141 140 140  K 3.6 3.7 3.2* 3.7  CL 103 109 106 107  CO2 26 23 28 26   GLUCOSE 110* 98 113* 104*  BUN 16 13 7 8   CREATININE 0.84 0.70 0.82 0.83  CALCIUM 9.8 8.6* 9.0 8.6*  MG  --  2.0  --   --   PHOS  --  3.5  --   --         Radiology Studies: No results found.      Scheduled Meds: . aspirin  81 mg Oral BID  . enoxaparin (LOVENOX) injection  40 mg Subcutaneous Q24H  . feeding supplement  1 Container Oral TID BM  . irbesartan  300 mg Oral Daily  . levothyroxine  175 mcg Oral QHS  . pantoprazole  20 mg Oral Daily  . polyethylene glycol  17 g Oral BID   Continuous Infusions: . sodium chloride 50 mL/hr at 04/22/17 1637  . ciprofloxacin Stopped (04/23/17 0600)  . metronidazole Stopped (04/23/17 7353)     LOS: 3 days     Syaire Saber, MD, FACP, FHM. [facility] Pager [phone]  If 7PM-7AM, please contact night-coverage [email or web address] Password [email or web address] 04/23/2017, 11:20 AM

## 2017-04-23 NOTE — Progress Notes (Signed)
Physical Therapy Treatment Patient Details Name: [name] MRN: [id number] DOB: 10/13/1955 Today's Date: 04/23/2017    History of Present Illness Pt is a 61 yo female admitted through the ED on 04/18/17 for abdominal pain. Pt was diagnosed with colitis. PMH significant for tubular adenoma of colon, diverticulosis, OAB, obesity, migraine, hypothryoid, HTN, GERD, anemia, partial TKA 04/01/17 on R.     PT Comments    Excellent progress with knee ROM and strength; Pt also tells me her bowel function seems to be returning, she feels better than yesterday, and she is hopeful to get home tomorrow;   Follow Up Recommendations  Outpatient PT     Equipment Recommendations  None recommended by PT    Recommendations for Other Services       Precautions / Restrictions Precautions Precautions: None Restrictions Other Position/Activity Restrictions: WBAT    Mobility  Bed Mobility Overal bed mobility: Independent                Transfers Overall transfer level: Independent   Transfers: Sit to/from Stand Sit to Stand: Independent            Ambulation/Gait Ambulation/Gait assistance: Modified independent (Device/Increase time) Ambulation Distance (Feet): 30 Feet Assistive device:  (pushing IV pole) Gait Pattern/deviations: WFL(Within Functional Limits)     General Gait Details: Steady gait; showing carryover of cues from last few sessions; declined long walk due to big need to stay close to her bathroom   Stairs            Wheelchair Mobility    Modified Rankin (Stroke Patients Only)       Balance                                            Cognition Arousal/Alertness: Awake/alert Behavior During Therapy: WFL for tasks assessed/performed Overall Cognitive Status: Within Functional Limits for tasks assessed                                        Exercises Total Joint Exercises Heel Slides: AAROM;Right;10  reps;Other (comment) (self-aarom with belt around foot) Straight Leg Raises: AROM;Strengthening;Right;10 reps Long Arc Quad: AROM;Right;10 reps;Seated Knee Flexion: AROM;AAROM;Right;5 reps;Seated (good use of LLE to assist RLE into greater flexion stretch) Goniometric ROM: -3-94 degrees R knee    General Comments        Pertinent Vitals/Pain Pain Assessment: 0-10 Pain Score: 7  Pain Location: R knee with bending to end range flexion; subsides quickly Pain Descriptors / Indicators: Sore Pain Intervention(s): Monitored during session    Home Living                      Prior Function            PT Goals (current goals can now be found in the care plan section) Acute Rehab PT Goals Patient Stated Goal: to get back to outpatient therapy PT Goal Formulation: With patient Time For Goal Achievement: 04/27/17 Potential to Achieve Goals: Good Progress towards PT goals: Progressing toward goals    Frequency    Min 3X/week      PT Plan Frequency needs to be updated    Co-evaluation              AM-PAC PT "6  Clicks" Daily Activity  Outcome Measure  Difficulty turning over in bed (including adjusting bedclothes, sheets and blankets)?: None Difficulty moving from lying on back to sitting on the side of the bed? : None Difficulty sitting down on and standing up from a chair with arms (e.g., wheelchair, bedside commode, etc,.)?: None Help needed moving to and from a bed to chair (including a wheelchair)?: None Help needed walking in hospital room?: None Help needed climbing 3-5 steps with a railing? : None 6 Click Score: 24    End of Session   Activity Tolerance: Patient tolerated treatment well Patient left: in bed;with call bell/phone within reach Nurse Communication: Mobility status PT Visit Diagnosis: Difficulty in walking, not elsewhere classified (R26.2)     Time: 8099-8338 PT Time Calculation (min) (ACUTE ONLY): 29 min  Charges:  $Therapeutic  Exercise: 23-37 mins                    G Codes:       Roney Marion, PT  [place] Pager [phone] Office [place] 04/23/2017, 4:55 PM

## 2017-04-24 DIAGNOSIS — K5903 Drug induced constipation: Secondary | ICD-10-CM

## 2017-04-24 DIAGNOSIS — K529 Noninfective gastroenteritis and colitis, unspecified: Secondary | ICD-10-CM

## 2017-04-24 DIAGNOSIS — T402X5A Adverse effect of other opioids, initial encounter: Secondary | ICD-10-CM

## 2017-04-24 MED ORDER — METRONIDAZOLE 500 MG PO TABS: 500.0000 mg | ORAL_TABLET | Freq: Three times a day (TID) | ORAL | Status: DC

## 2017-04-24 MED ORDER — UNABLE TO FIND: 0 refills | Status: DC

## 2017-04-24 MED ORDER — SENNOSIDES-DOCUSATE SODIUM 8.6-50 MG PO TABS: 1.0000 | ORAL_TABLET | Freq: Every day | ORAL | 1 refills | Status: DC

## 2017-04-24 MED ORDER — HYOSCYAMINE SULFATE 0.125 MG SL SUBL: 0.1250 mg | SUBLINGUAL_TABLET | Freq: Four times a day (QID) | SUBLINGUAL | 0 refills | Status: DC | PRN

## 2017-04-24 MED ORDER — METRONIDAZOLE 500 MG PO TABS: 500.0000 mg | ORAL_TABLET | Freq: Three times a day (TID) | ORAL | 0 refills | Status: DC

## 2017-04-24 MED ORDER — POLYETHYLENE GLYCOL 3350 17 G PO PACK: 17.0000 g | PACK | Freq: Every day | ORAL | Status: DC

## 2017-04-24 MED ORDER — TRAMADOL HCL 50 MG PO TABS: 50.0000 mg | ORAL_TABLET | Freq: Three times a day (TID) | ORAL | 0 refills | Status: DC | PRN

## 2017-04-24 MED ORDER — IRBESARTAN 300 MG PO TABS: 300.0000 mg | ORAL_TABLET | Freq: Every day | ORAL | 3 refills | Status: DC

## 2017-04-24 MED ORDER — CIPROFLOXACIN HCL 500 MG PO TABS
500.0000 mg | ORAL_TABLET | Freq: Two times a day (BID) | ORAL | Status: DC
  Filled 2017-04-24: qty 1

## 2017-04-24 MED ORDER — ONDANSETRON 4 MG PO TBDP: 4.0000 mg | ORAL_TABLET | Freq: Three times a day (TID) | ORAL | 0 refills | Status: DC | PRN

## 2017-04-24 MED ORDER — CIPROFLOXACIN HCL 500 MG PO TABS: 500.0000 mg | ORAL_TABLET | Freq: Two times a day (BID) | ORAL | 0 refills | Status: DC

## 2017-04-24 MED ORDER — POLYETHYLENE GLYCOL 3350 17 G PO PACK: 17.0000 g | PACK | Freq: Every day | ORAL | 2 refills | Status: DC | PRN

## 2017-04-24 NOTE — Progress Notes (Signed)
Nutrition Education Note  RD consulted for nutrition education regarding a Low Fiber/Low Residue diet.   RD provided "Low Fiber Nutrition Therapy" handout from the [employer]. Discussed nutrition therapy to reduce the irritation of the gastrointestinal tract to promote healing. Provided list of recommended low fiberous foods in comparison to foods with high amounts of fiber, as well as a recommended sample day. Teach back method used. Pt seems motivated to learn and has a supportive family at home.   Expect great compliance.  Body mass index is 35.7 kg/m. Pt meets criteria for obese based on current BMI.  Current diet order is Heart Healthy, patient is consuming approximately 100% of meals at this time. Labs and medications reviewed. RD contact information provided. If additional nutrition issues arise, please re-consult RD.  Mariana Single RD, LDN Clinical Nutrition Pager # [phone]

## 2017-04-24 NOTE — Discharge Summary (Signed)
Physician Discharge Summary   Patient ID: JEANETTA ALONZO MRN: [id number] DOB/AGE: 1955-11-17 61 y.o.  Admit date: 04/18/2017 Discharge date: 04/24/2017  Primary Care Physician:  Harlan Stains, MD  Discharge Diagnoses:    . Acute diverticulitis . GERD . Hypertension . Hypothyroidism . Constipation with IBS  Consults :  Gastroenterology   Recommendations for Outpatient Follow-up:  1. Resume outpatient physical therapy 2. Please repeat CBC/BMET at next visit   DIET heart healthy diet  Allergies:   Allergies  Allergen Reactions  . Cephalexin Hives  . Macrobid [Nitrofurantoin Macrocrystal] Nausea And Vomiting and Other (See Comments)    Light headed  . Zithromax [Azithromycin] Other (See Comments)    Gi upset      DISCHARGE MEDICATIONS: Current Discharge Medication List    START taking these medications   Details  ciprofloxacin (CIPRO) 500 MG tablet Take 1 tablet (500 mg total) by mouth 2 (two) times daily. X 9 more days Qty: 18 tablet, Refills: 0    irbesartan (AVAPRO) 300 MG tablet Take 1 tablet (300 mg total) by mouth daily. Qty: 30 tablet, Refills: 3    metroNIDAZOLE (FLAGYL) 500 MG tablet Take 1 tablet (500 mg total) by mouth 3 (three) times daily. X 9 more days Qty: 27 tablet, Refills: 0    ondansetron (ZOFRAN ODT) 4 MG disintegrating tablet Take 1 tablet (4 mg total) by mouth every 8 (eight) hours as needed for nausea or vomiting. Qty: 20 tablet, Refills: 0    senna-docusate (SENOKOT S) 8.6-50 MG tablet Take 1 tablet by mouth at bedtime. For constipation Qty: 30 tablet, Refills: 1    traMADol (ULTRAM) 50 MG tablet Take 1 tablet (50 mg total) by mouth every 8 (eight) hours as needed for moderate pain or severe pain. Qty: 30 tablet, Refills: 0    UNABLE TO FIND OUTPATIENT PHYSICAL THERAPY   Diagnosis: partial TKA 04/01/17 on RIGHT, ac diverticulitis Qty: 1 Mutually Defined, Refills: 0      CONTINUE these medications which have CHANGED   Details   hyoscyamine (LEVSIN SL) 0.125 MG SL tablet Take 1 tablet (0.125 mg total) by mouth every 6 (six) hours as needed. cramping Qty: 90 tablet, Refills: 0    polyethylene glycol (MIRALAX / GLYCOLAX) packet Take 17 g by mouth daily as needed for moderate constipation. Qty: 30 each, Refills: 2      CONTINUE these medications which have NOT CHANGED   Details  aspirin 81 MG chewable tablet Chew 1 tablet (81 mg total) by mouth 2 (two) times daily. Take for 4 weeks. Qty: 60 tablet, Refills: 0    Calcium Carbonate-Vit D-Min (CALCIUM 1200 PO) Take 1,200 Units by mouth daily.    ferrous sulfate (FERROUSUL) 325 (65 FE) MG tablet Take 1 tablet (325 mg total) by mouth 3 (three) times daily with meals.    fluticasone (FLONASE) 50 MCG/ACT nasal spray Place 2 sprays into the nose daily as needed for allergies.     lansoprazole (PREVACID) 15 MG capsule Take 15 mg by mouth daily.    levothyroxine (SYNTHROID, LEVOTHROID) 175 MCG tablet Take 175 mcg by mouth at bedtime.    LORazepam (ATIVAN) 0.5 MG tablet Take 0.5 mg by mouth 2 (two) times daily as needed for anxiety.     methocarbamol (ROBAXIN) 500 MG tablet Take 1 tablet (500 mg total) by mouth every 6 (six) hours as needed for muscle spasms. Qty: 40 tablet, Refills: 0    metroNIDAZOLE (METROCREAM) 0.75 % cream Apply 1 application topically 2 (  two) times daily.     Probiotic Product (RESTORA) CAPS TAKE ONE CAPSULE BY MOUTH EVERY DAY (NEED APPT FOR FURTHER REFILLS) Qty: 30 capsule, Refills: 0    psyllium (REGULOID) 0.52 g capsule Take 2 capsules by mouth at bedtime.    valACYclovir (VALTREX) 500 MG tablet Take 1,000 mg by mouth 2 (two) times daily as needed (fever blisters).    zolpidem (AMBIEN) 5 MG tablet Take 2.5 mg by mouth at bedtime as needed for sleep.      STOP taking these medications     docusate sodium (COLACE) 100 MG capsule      HYDROcodone-acetaminophen (NORCO) 7.5-325 MG tablet      valsartan (DIOVAN) 320 MG tablet           Brief H and P: For complete details please refer to admission H and P, but in brief 61 year old female with PMH of tubular adenoma of the colon under regular colonoscopy surveillance, diverticulosis, overactive bladder, obesity, migraine, hypothyroid, HTN, GERD, anemia, status post right partial knee replacement 04/01/17, ongoing issues with constipation, altered bowel regimen, lower abdominal discomfort, related to opioid pain medications since surgery presented with abdominal pain, seen at [facility] walk-in clinic day prior to admission and no relief with home bowel regimen and fluids, admitted for Acute diverticulitis.    Hospital Course:   Acute diverticulitis - Patient presented with abdominal pain. CT abdomen showed long segment wall thickening and pericolonic fat shortening of distal descending and proximal sigmoid colon favored to represent acute uncomplicated diverticulitis given the presence of several inflamed diverticula. No evidence of perforation or drainable fluid collection. Patient was placed on IV fluids and conservative treatment, IV antibiotics, ciprofloxacin and Flagyl. Patient was seen by gastroenterology. She had been on narcotics due to recent surgery and had been severely constipated. She was placed on twice a day MiraLAX and had a good bowel movement on 8/14. - She has been now tolerating diet without any difficulty. She was transitioned to oral antibiotics and GI has signed off. - She will be discharged home. It was recommended that narcotics be discontinued. She was given the prescription for tramadol for pain given her recent surgery for right knee replacement. Continue oral antibiotics and follow outpatient with GI.   constipation/IBS Constipation has been a major factor since her surgery however patient was on narcotics. Constipation has been resolved with MiraLAX. Patient was placed on Senokot as daily and MiraLAX has been resumed as needed  Status post partial  right knee replacement by Dr. Alvan Dame on 7/23 Continue PT outpatient. Patient does not wish to take narcotics anymore. She was given a prescription for tramadol. Continue aspirin twice a day for DVT prophylaxis until outpatient follow-up with Dr. Alvan Dame. Continue outpatient PT  Hypothyroidism Continue Synthroid  Essential hypertension - Valsartan discontinued due to recent recall and placed on Avapro   GERD  continue PPI     Day of Discharge BP 140/69 (BP Location: Left Arm)   Pulse 76   Temp 98 F (36.7 C) (Oral)   Resp 20   Ht 5\' 5"  (1.651 m)   Wt 97.3 kg (214 lb 8.1 oz)   SpO2 94%   BMI 35.70 kg/m   Physical Exam: General: Alert and awake oriented x3 not in any acute distress. HEENT: anicteric sclera, pupils reactive to light and accommodation CVS: S1-S2 clear no murmur rubs or gallops Chest: clear to auscultation bilaterally, no wheezing rales or rhonchi Abdomen: soft nontender, nondistended, normal bowel sounds Extremities: no cyanosis, clubbing  or edema noted bilaterally Neuro: Cranial nerves II-XII intact, no focal neurological deficits   The results of significant diagnostics from this hospitalization (including imaging, microbiology, ancillary and laboratory) are listed below for reference.    LAB RESULTS: Basic Metabolic Panel:  Recent Labs Lab 04/19/17 0349 04/22/17 0440 04/23/17 0453  NA 141 140 140  K 3.7 3.2* 3.7  CL 109 106 107  CO2 23 28 26   GLUCOSE 98 113* 104*  BUN 13 7 8   CREATININE 0.70 0.82 0.83  CALCIUM 8.6* 9.0 8.6*  MG 2.0  --   --   PHOS 3.5  --   --    Liver Function Tests: No results for input(s): AST, ALT, ALKPHOS, BILITOT, PROT, ALBUMIN in the last 168 hours. No results for input(s): LIPASE, AMYLASE in the last 168 hours. No results for input(s): AMMONIA in the last 168 hours. CBC:  Recent Labs Lab 04/19/17 0349 04/22/17 0440  WBC 8.1 6.6  HGB 12.0 12.1  HCT 37.1 37.2  MCV 90.5 91.9  PLT 339 381   Cardiac Enzymes: No  results for input(s): CKTOTAL, CKMB, CKMBINDEX, TROPONINI in the last 168 hours. BNP: Invalid input(s): POCBNP CBG: No results for input(s): GLUCAP in the last 168 hours.  Significant Diagnostic Studies:  Ct Abdomen Pelvis W Contrast  Result Date: 04/18/2017 CLINICAL DATA:  Abdominal pain and discomfort.  Recent surgery. EXAM: CT ABDOMEN AND PELVIS WITH CONTRAST TECHNIQUE: Multidetector CT imaging of the abdomen and pelvis was performed using the standard protocol following bolus administration of intravenous contrast. CONTRAST:  100 cc Isovue-300 intravenous contrast. COMPARISON:  CT abdomen and pelvis dated September 10, 2012. FINDINGS: Lower chest: Bibasilar atelectasis.  No acute abnormality. Hepatobiliary: No focal liver abnormality is seen. No gallstones, gallbladder wall thickening, or biliary dilatation. Pancreas: Unremarkable. No pancreatic ductal dilatation or surrounding inflammatory changes. Spleen: Normal in size without focal abnormality. Adrenals/Urinary Tract: Adrenal glands are unremarkable. Kidneys are normal, without renal calculi, focal lesion, or hydronephrosis. Bladder is unremarkable. Stomach/Bowel: Extensive diverticulosis with long segment prominent wall thickening and pericolonic fat stranding of the distal descending and proximal sigmoid colon. No pneumoperitoneum or drainable fluid collection. The stomach and small bowel are unremarkable.  No obstruction. Vascular/Lymphatic: No significant vascular findings are present. No enlarged abdominal or pelvic lymph nodes. Reproductive: Status post hysterectomy. No adnexal masses. Other: No abdominal wall hernia or abnormality. Trace free fluid in the left paracolic gutter and pelvis. Musculoskeletal: No acute or significant osseous findings. IMPRESSION: 1. Long segment wall thickening and pericolonic fat stranding of the distal descending and proximal sigmoid colon, favored to represent acute uncomplicated diverticulitis given the presence  of several inflamed diverticula. The length of the involved colon, however, is greater than typical for diverticulitis, and additional etiologies such as infectious or inflammatory colitis should be considered. No evidence of perforation or drainable fluid collection. 2. No evidence of bowel obstruction as clinically queried. Electronically Signed   By: Titus Dubin M.D.   On: 04/18/2017 14:33    2D ECHO:   Disposition and Follow-up: Discharge Instructions    Diet - low sodium heart healthy    Complete by:  As directed    Discharge instructions    Complete by:  As directed    Soft or Full liquid diet, advance diet slowly as tolerated   Increase activity slowly    Complete by:  As directed        DISPOSITION: home    DISCHARGE FOLLOW-UP Follow-up Information    White,  Caren Griffins, MD. Schedule an appointment as soon as possible for a visit in 2 week(s).   Specialty:  Family Medicine Contact information: [address] [place] [address] [phone]            Time spent on Discharge: 81mins   Signed:   Estill Cotta M.D. [facility] 04/24/2017, 12:19 PM Pager: [phone]

## 2017-04-24 NOTE — Progress Notes (Signed)
Discharge instructions,medications/prescriptions and follow up appointments discussed and reviewed with pt and husband at bedside, verbalized understanding. IV dc'ed, site clean and dry. Pt was escorted out of the unit in wheelchair, took all belongings with her.

## 2017-04-24 NOTE — Care Management Note (Signed)
Case Management Note  Patient Details  Name: [name] MRN: [id number] Date of Birth: [birth year]  Subjective/Objective:              Spoke w patient and husband at the bedside. They are actively going to [facility] for OT s/p knee. They deny other CM needs.       Action/Plan:  DC to home Expected Discharge Date:  04/24/17               Expected Discharge Plan:  Home/Self Care  In-House Referral:     Discharge planning Services  CM Consult  Post Acute Care Choice:    Choice offered to:     DME Arranged:    DME Agency:     HH Arranged:    HH Agency:     Status of Service:  Completed, signed off  If discussed at [employer] of Stay Meetings, dates discussed:    Additional Comments:  Carles Collet, RN 04/24/2017, 12:10 PM

## 2017-04-30 ENCOUNTER — Ambulatory Visit: Payer: BLUE CROSS/BLUE SHIELD | Admitting: Physical Therapy

## 2017-04-30 ENCOUNTER — Encounter: Payer: Self-pay | Admitting: Physical Therapy

## 2017-04-30 DIAGNOSIS — M25661 Stiffness of right knee, not elsewhere classified: Secondary | ICD-10-CM

## 2017-04-30 DIAGNOSIS — R2241 Localized swelling, mass and lump, right lower limb: Secondary | ICD-10-CM

## 2017-04-30 DIAGNOSIS — R262 Difficulty in walking, not elsewhere classified: Secondary | ICD-10-CM

## 2017-04-30 DIAGNOSIS — M25561 Pain in right knee: Secondary | ICD-10-CM

## 2017-04-30 NOTE — Therapy (Signed)
[address] [place], [place], [address] Phone: [phone]   Fax:  [phone]  Physical Therapy Treatment  Patient Details  Name: [name] MRN: [id number] Date of Birth: 1955/11/30 Referring Provider: Alvan Dame  Encounter Date: 04/30/2017      PT End of Session - 04/30/17 1141    Visit Number 7   Date for PT Re-Evaluation 06/05/17   PT Start Time 1100   PT Stop Time 1157   PT Time Calculation (min) 57 min   Activity Tolerance Patient tolerated treatment well   Behavior During Therapy [facility] for tasks assessed/performed      Past Medical History:  Diagnosis Date  . Acne rosacea   . Acute diverticulitis 09/10/2012  . Allergic rhinitis   . Anemia   . Anxiety   . Colitis   . Diverticulosis   . Family history of adverse reaction to anesthesia    "sister's BP drops way low" (04/18/2017)  . GERD (gastroesophageal reflux disease)   . Goiter   . Hip bursitis   . Hypertension   . Hypothyroidism   . Migraines    "maybe once/year" (04/18/2017)  . Multiple thyroid nodules   . Obesity   . Osteoarthritis    "knees" (04/18/2017)  . Overactive bladder    carpal tunnel   . Shortness of breath    with exertion   . Squamous cell carcinoma 03/2017   RLE  . Tubular adenoma of colon 04/2011  . Wears glasses     Past Surgical History:  Procedure Laterality Date  . CAUTERY OF TURBINATES  1990's  . COLONOSCOPY    . EXCISIONAL HEMORRHOIDECTOMY    . GANGLION CYST EXCISION Right 06/25/2013   Procedure: RIGHT ANKLE GANGLION CYST EXCISION;  Surgeon: Wylene Simmer, MD;  Location: [place];  Service: Orthopedics;  Laterality: Right;  . INCISION AND DRAINAGE OF WOUND Right 07/23/2013   Procedure: IRRIGATION AND DEBRIDEMENT OF RIGHT ANKLE;  Surgeon: Wylene Simmer, MD;  Location: [place];  Service: Orthopedics;  Laterality: Right;  Esmark used for tourniquet--on at 1417, off at 1437.  Marland Kitchen KNEE  ARTHROSCOPY Right 11/2010  . NASAL SEPTUM SURGERY  1991  . PARTIAL KNEE ARTHROPLASTY Right 04/01/2017   Procedure: UNICOMPARTMENTAL RIGHT KNEE- Medially;  Surgeon: Paralee Cancel, MD;  Location: [facility];  Service: Orthopedics;  Laterality: Right;  90 mins  . PARTIAL KNEE ARTHROPLASTY Left 04/2009  . PARTIAL KNEE ARTHROPLASTY Right 04/01/2017  . THYROIDECTOMY  05/08/2012   Procedure: THYROIDECTOMY;  Surgeon: Earnstine Regal, MD;  Location: [facility];  Service: General;  Laterality: N/A;  Total Thyriodectomy  . TOTAL ABDOMINAL HYSTERECTOMY  2003   TOTAL ABDOMINAL HYSTERECTOMY W/ BILATERAL SALPINGOOPHORECTOMY  . TUBAL LIGATION    . WOUND DEBRIDEMENT Right 08/2013   ankle    There were no vitals filed for this visit.      Subjective Assessment - 04/30/17 1102    Subjective "Going good, I went back to work for an hour today." Pt reports being in the hospital, but was able to receive therapy there.   Currently in Pain? No/denies   Pain Score 0-No pain            [facility] PT Assessment - 04/30/17 0001      AROM   AROM Assessment Site Knee   Right/Left Knee Right   Right Knee Extension 5   Right Knee Flexion 103  [facility] Adult PT Treatment/Exercise - 04/30/17 0001      Knee/Hip Exercises: Aerobic   Nustep level 3 x 6 minutes, very slow LE onlly     Knee/Hip Exercises: Machines for Strengthening   Cybex Knee Extension 5lb 2x15   Cybex Knee Flexion 25lb 2x10; RLE 15lb 2x10    Cybex Leg Press 20# 2x15, then right leg only no weight, RLE on 20lb TKE 2x5     Knee/Hip Exercises: Standing   Lateral Step Up Right;1 set;10 reps;Hand Hold: 1;Step Height: 6"   Forward Step Up Right;2 sets;5 reps;Hand Hold: 2;Step Height: 6"     Knee/Hip Exercises: Seated   Long Arc Quad Right;2 sets;15 reps   Sit to [employer] 2 sets;10 reps;without UE support  from blue chair     Vasopneumatic   Number Minutes Vasopneumatic  15 minutes   Vasopnuematic Location  Knee    Vasopneumatic Pressure Low   Vasopneumatic Temperature  35     Manual Therapy   Manual Therapy Passive ROM   Passive ROM PROM in sitting, working only on flexion                  PT Short Term Goals - 04/04/17 1031      PT SHORT TERM GOAL #1   Title independent with initial HEP   Time 2   Period Weeks   Status New           PT Long Term Goals - 04/12/17 1104      PT LONG TERM GOAL #1   Title understand and perform RICE   Status Achieved     PT LONG TERM GOAL #2   Title decrease pain 50%   Status Partially Met               Plan - 04/30/17 1142    Clinical Impression Statement No issues reported with today's exercises, Pt reports that she felt a little more pull as the session went on. Pt has progress with both R quad activation and R knee AROM. Some compensation with step up and sit to stands.   Rehab Potential Good   PT Frequency 3x / week   PT Duration 8 weeks   PT Treatment/Interventions ADLs/Self Care Home Management;Cryotherapy;Electrical Stimulation;Gait training;Stair training;Functional mobility training;Patient/family education;Balance training;Therapeutic exercise;Therapeutic activities;Vasopneumatic Device;Manual techniques   PT Next Visit Plan slowly work on ROM and mm activtation      Patient will benefit from skilled therapeutic intervention in order to improve the following deficits and impairments:  Abnormal gait, Decreased activity tolerance, Decreased balance, Decreased mobility, Decreased strength, Impaired sensation, Decreased scar mobility, Pain, Decreased range of motion, Difficulty walking  Visit Diagnosis: Stiffness of right knee, not elsewhere classified  Acute pain of right knee  Difficulty in walking, not elsewhere classified  Localized swelling, mass and lump, right lower limb     Problem List Patient Active Problem List   Diagnosis Date Noted  . Colitis 04/20/2017  . Diverticulitis 04/18/2017  . Constipation  due to opioid therapy 04/18/2017  . Migraine headache without aura 04/18/2017  . Generalized abdominal pain   . S/P right unicompartmental knee replacement 04/01/2017  . Obesity (BMI 30.0-34.9) 11/27/2012  . Hypothyroidism 09/13/2012  . Acute diverticulitis 09/10/2012  . Hypertension 09/10/2012  . Rosacea 09/10/2012  . Thyroid cancer, follicular variant of papillary thyroid carcinoma 06/09/2012  . Multinodular goiter (nontoxic) 04/17/2012  . GERD 05/26/2009    Scot Jun, PTA 04/30/2017, 11:43 AM  [facility]  [address] [place], [place], [address] Phone: [phone]   Fax:  [phone]  Name: [name] MRN: [id number] Date of Birth: December 26, 1955

## 2017-05-02 ENCOUNTER — Ambulatory Visit: Payer: BLUE CROSS/BLUE SHIELD | Admitting: Physical Therapy

## 2017-05-02 ENCOUNTER — Encounter: Payer: Self-pay | Admitting: Physical Therapy

## 2017-05-02 DIAGNOSIS — M25661 Stiffness of right knee, not elsewhere classified: Secondary | ICD-10-CM

## 2017-05-02 DIAGNOSIS — R2241 Localized swelling, mass and lump, right lower limb: Secondary | ICD-10-CM

## 2017-05-02 DIAGNOSIS — M25561 Pain in right knee: Secondary | ICD-10-CM

## 2017-05-02 DIAGNOSIS — R262 Difficulty in walking, not elsewhere classified: Secondary | ICD-10-CM

## 2017-05-02 NOTE — Therapy (Signed)
[address] [place], [place], [address] Phone: [phone]   Fax:  [phone]  Physical Therapy Treatment  Patient Details  Name: [name] MRN: [id number] Date of Birth: 1956-05-16 Referring Provider: Alvan Dame  Encounter Date: 05/02/2017      PT End of Session - 05/02/17 1140    Visit Number 8   Date for PT Re-Evaluation 06/05/17   PT Start Time 1100   PT Stop Time 1155   PT Time Calculation (min) 55 min   Activity Tolerance Patient tolerated treatment well   Behavior During Therapy [facility] for tasks assessed/performed      Past Medical History:  Diagnosis Date  . Acne rosacea   . Acute diverticulitis 09/10/2012  . Allergic rhinitis   . Anemia   . Anxiety   . Colitis   . Diverticulosis   . Family history of adverse reaction to anesthesia    "sister's BP drops way low" (04/18/2017)  . GERD (gastroesophageal reflux disease)   . Goiter   . Hip bursitis   . Hypertension   . Hypothyroidism   . Migraines    "maybe once/year" (04/18/2017)  . Multiple thyroid nodules   . Obesity   . Osteoarthritis    "knees" (04/18/2017)  . Overactive bladder    carpal tunnel   . Shortness of breath    with exertion   . Squamous cell carcinoma 03/2017   RLE  . Tubular adenoma of colon 04/2011  . Wears glasses     Past Surgical History:  Procedure Laterality Date  . CAUTERY OF TURBINATES  1990's  . COLONOSCOPY    . EXCISIONAL HEMORRHOIDECTOMY    . GANGLION CYST EXCISION Right 06/25/2013   Procedure: RIGHT ANKLE GANGLION CYST EXCISION;  Surgeon: Wylene Simmer, MD;  Location: [place];  Service: Orthopedics;  Laterality: Right;  . INCISION AND DRAINAGE OF WOUND Right 07/23/2013   Procedure: IRRIGATION AND DEBRIDEMENT OF RIGHT ANKLE;  Surgeon: Wylene Simmer, MD;  Location: [place];  Service: Orthopedics;  Laterality: Right;  Esmark used for tourniquet--on at 1417, off at 1437.  Marland Kitchen KNEE  ARTHROSCOPY Right 11/2010  . NASAL SEPTUM SURGERY  1991  . PARTIAL KNEE ARTHROPLASTY Right 04/01/2017   Procedure: UNICOMPARTMENTAL RIGHT KNEE- Medially;  Surgeon: Paralee Cancel, MD;  [facility];  Service: Orthopedics;  Laterality: Right;  90 mins  . PARTIAL KNEE ARTHROPLASTY Left 04/2009  . PARTIAL KNEE ARTHROPLASTY Right 04/01/2017  . THYROIDECTOMY  05/08/2012   Procedure: THYROIDECTOMY;  Surgeon: Earnstine Regal, MD;  [facility];  Service: General;  Laterality: N/A;  Total Thyriodectomy  . TOTAL ABDOMINAL HYSTERECTOMY  2003   TOTAL ABDOMINAL HYSTERECTOMY W/ BILATERAL SALPINGOOPHORECTOMY  . TUBAL LIGATION    . WOUND DEBRIDEMENT Right 08/2013   ankle    There were no vitals filed for this visit.      Subjective Assessment - 05/02/17 1102    Subjective Pt reports no issues with knee.    Currently in Pain? Yes   Pain Score 2    Pain Location Knee   Pain Descriptors / Indicators Tightness                         [facility] Adult PT Treatment/Exercise - 05/02/17 0001      Knee/Hip Exercises: Aerobic   Nustep level 5x 6 minutes, very slow LE onlly     Knee/Hip Exercises: Machines  for Strengthening   Cybex Knee Extension 5lb  x10 X5   Cybex Knee Flexion 25lb 2x15   Cybex Leg Press 20# 2x10     Knee/Hip Exercises: Standing   Lateral Step Up Right;10 reps;Hand Hold: 1;Step Height: 6";2 sets   Forward Step Up Right;2 sets;Step Height: 6";Hand Hold: 1;10 reps   Gait Training 30lb 4 way x 3 each     Vasopneumatic   Number Minutes Vasopneumatic  15 minutes   Vasopnuematic Location  Knee   Vasopneumatic Pressure Low   Vasopneumatic Temperature  35                  PT Short Term Goals - 04/04/17 1031      PT SHORT TERM GOAL #1   Title independent with initial HEP   Time 2   Period Weeks   Status New           PT Long Term Goals - 04/12/17 1104      PT LONG TERM GOAL #1   Title understand and perform RICE   Status Achieved     PT  LONG TERM GOAL #2   Title decrease pain 50%   Status Partially Met               Plan - 05/02/17 1142    Clinical Impression Statement Pt enters clinic reporting that she did not feel well due to her GI issues. She reports not doing much today and having increase tightness today. All exercises completed well, does reports increase anterior r knee pain with leg press and leg extensions.   Rehab Potential Good   PT Frequency 3x / week   PT Duration 8 weeks   PT Next Visit Plan slowly work on ROM and mm activtation      Patient will benefit from skilled therapeutic intervention in order to improve the following deficits and impairments:  Abnormal gait, Decreased activity tolerance, Decreased balance, Decreased mobility, Decreased strength, Impaired sensation, Decreased scar mobility, Pain, Decreased range of motion, Difficulty walking  Visit Diagnosis: Stiffness of right knee, not elsewhere classified  Acute pain of right knee  Difficulty in walking, not elsewhere classified  Localized swelling, mass and lump, right lower limb     Problem List Patient Active Problem List   Diagnosis Date Noted  . Colitis 04/20/2017  . Diverticulitis 04/18/2017  . Constipation due to opioid therapy 04/18/2017  . Migraine headache without aura 04/18/2017  . Generalized abdominal pain   . S/P right unicompartmental knee replacement 04/01/2017  . Obesity (BMI 30.0-34.9) 11/27/2012  . Hypothyroidism 09/13/2012  . Acute diverticulitis 09/10/2012  . Hypertension 09/10/2012  . Rosacea 09/10/2012  . Thyroid cancer, follicular variant of papillary thyroid carcinoma 06/09/2012  . Multinodular goiter (nontoxic) 04/17/2012  . GERD 05/26/2009    Scot Jun 05/02/2017, 11:43 AM  [address] [place], [place], [address] Phone: [phone]   Fax:  [phone]  Name: [name] MRN: [id number] Date of  Birth: [birth year]

## 2017-05-03 ENCOUNTER — Ambulatory Visit: Payer: BLUE CROSS/BLUE SHIELD | Admitting: Physical Therapy

## 2017-05-03 ENCOUNTER — Encounter: Payer: Self-pay | Admitting: Physical Therapy

## 2017-05-03 DIAGNOSIS — R2241 Localized swelling, mass and lump, right lower limb: Secondary | ICD-10-CM

## 2017-05-03 DIAGNOSIS — M25661 Stiffness of right knee, not elsewhere classified: Secondary | ICD-10-CM

## 2017-05-03 DIAGNOSIS — M25561 Pain in right knee: Secondary | ICD-10-CM

## 2017-05-03 DIAGNOSIS — R262 Difficulty in walking, not elsewhere classified: Secondary | ICD-10-CM

## 2017-05-03 NOTE — Therapy (Signed)
[facility] [place], [place], [address] Phone: [phone]   Fax:  [phone]  Physical Therapy Treatment  Patient Details  Name: [name] MRN: [id number] Date of Birth: 07-17-1956 Referring Provider: Alvan Dame  Encounter Date: 05/03/2017      PT End of Session - 05/03/17 0925    PT Start Time 0851   PT Stop Time 0940   PT Time Calculation (min) 49 min   Activity Tolerance Patient tolerated treatment well   Behavior During Therapy [facility] for tasks assessed/performed      Past Medical History:  Diagnosis Date  . Acne rosacea   . Acute diverticulitis 09/10/2012  . Allergic rhinitis   . Anemia   . Anxiety   . Colitis   . Diverticulosis   . Family history of adverse reaction to anesthesia    "sister's BP drops way low" (04/18/2017)  . GERD (gastroesophageal reflux disease)   . Goiter   . Hip bursitis   . Hypertension   . Hypothyroidism   . Migraines    "maybe once/year" (04/18/2017)  . Multiple thyroid nodules   . Obesity   . Osteoarthritis    "knees" (04/18/2017)  . Overactive bladder    carpal tunnel   . Shortness of breath    with exertion   . Squamous cell carcinoma 03/2017   RLE  . Tubular adenoma of colon 04/2011  . Wears glasses     Past Surgical History:  Procedure Laterality Date  . CAUTERY OF TURBINATES  1990's  . COLONOSCOPY    . EXCISIONAL HEMORRHOIDECTOMY    . GANGLION CYST EXCISION Right 06/25/2013   Procedure: RIGHT ANKLE GANGLION CYST EXCISION;  Surgeon: Wylene Simmer, MD;  Location: [facility];  Service: Orthopedics;  Laterality: Right;  . INCISION AND DRAINAGE OF WOUND Right 07/23/2013   Procedure: IRRIGATION AND DEBRIDEMENT OF RIGHT ANKLE;  Surgeon: Wylene Simmer, MD;  Location: [facility];  Service: Orthopedics;  Laterality: Right;  Esmark used for tourniquet--on at 1417, off at 1437.  Marland Kitchen KNEE ARTHROSCOPY Right 11/2010  . NASAL SEPTUM SURGERY  1991   . PARTIAL KNEE ARTHROPLASTY Right 04/01/2017   Procedure: UNICOMPARTMENTAL RIGHT KNEE- Medially;  Surgeon: Paralee Cancel, MD;  [facility];  Service: Orthopedics;  Laterality: Right;  90 mins  . PARTIAL KNEE ARTHROPLASTY Left 04/2009  . PARTIAL KNEE ARTHROPLASTY Right 04/01/2017  . THYROIDECTOMY  05/08/2012   Procedure: THYROIDECTOMY;  Surgeon: Earnstine Regal, MD;  [facility];  Service: General;  Laterality: N/A;  Total Thyriodectomy  . TOTAL ABDOMINAL HYSTERECTOMY  2003   TOTAL ABDOMINAL HYSTERECTOMY W/ BILATERAL SALPINGOOPHORECTOMY  . TUBAL LIGATION    . WOUND DEBRIDEMENT Right 08/2013   ankle    There were no vitals filed for this visit.      Subjective Assessment - 05/03/17 0854    Subjective Pt reports that she is doing better today.    Currently in Pain? Yes   Pain Score 2    Pain Location Knee   Pain Orientation Right                         [facility] Adult PT Treatment/Exercise - 05/03/17 0001      Knee/Hip Exercises: Aerobic   Recumbent Bike seat 9 x 4 min full rev rev   Nustep level 4 x 5 minutes, very slow LE onlly     Knee/Hip Exercises:  Machines for Strengthening   Cybex Knee Extension 10lb 2x10; Eccentrics 5lb x10 RLE    Cybex Knee Flexion 25lb 2x15; RLE 15lb 2x10   Cybex Leg Press 30# 2x10; TKE RLE 20lb 2x10      Knee/Hip Exercises: Standing   Step Down 2 sets;Right;Hand Hold: 2;10 reps;Step Height: 4"     Vasopneumatic   Number Minutes Vasopneumatic  15 minutes   Vasopnuematic Location  Knee   Vasopneumatic Pressure Low   Vasopneumatic Temperature  35                  PT Short Term Goals - 04/04/17 1031      PT SHORT TERM GOAL #1   Title independent with initial HEP   Time 2   Period Weeks   Status New           PT Long Term Goals - 04/12/17 1104      PT LONG TERM GOAL #1   Title understand and perform RICE   Status Achieved     PT LONG TERM GOAL #2   Title decrease pain 50%   Status Partially Met                Plan - 05/03/17 0926    Clinical Impression Statement Pt ~ 6 minutes late for today's treatment. Tolerated all exercises well, does reports some R knee pain with LAQ eccentrics. Pt able to tolerated increase weight on leg press.    Rehab Potential Good   PT Frequency 3x / week   PT Duration 8 weeks   PT Treatment/Interventions ADLs/Self Care Home Management;Cryotherapy;Electrical Stimulation;Gait training;Stair training;Functional mobility training;Patient/family education;Balance training;Therapeutic exercise;Therapeutic activities;Vasopneumatic Device;Manual techniques   PT Next Visit Plan slowly work on ROM and mm activation      Patient will benefit from skilled therapeutic intervention in order to improve the following deficits and impairments:  Abnormal gait, Decreased activity tolerance, Decreased balance, Decreased mobility, Decreased strength, Impaired sensation, Decreased scar mobility, Pain, Decreased range of motion, Difficulty walking  Visit Diagnosis: Stiffness of right knee, not elsewhere classified  Acute pain of right knee  Difficulty in walking, not elsewhere classified  Localized swelling, mass and lump, right lower limb     Problem List Patient Active Problem List   Diagnosis Date Noted  . Colitis 04/20/2017  . Diverticulitis 04/18/2017  . Constipation due to opioid therapy 04/18/2017  . Migraine headache without aura 04/18/2017  . Generalized abdominal pain   . S/P right unicompartmental knee replacement 04/01/2017  . Obesity (BMI 30.0-34.9) 11/27/2012  . Hypothyroidism 09/13/2012  . Acute diverticulitis 09/10/2012  . Hypertension 09/10/2012  . Rosacea 09/10/2012  . Thyroid cancer, follicular variant of papillary thyroid carcinoma 06/09/2012  . Multinodular goiter (nontoxic) 04/17/2012  . GERD 05/26/2009    Scot Jun, PTA 05/03/2017, 9:27 AM  [facility]  [employer] [place], [place], [address] Phone: [phone]   Fax:  [phone]  Name: PAMILA MENDIBLES MRN: [id number] Date of Birth: 1956/01/07

## 2017-05-06 ENCOUNTER — Encounter: Payer: Self-pay | Admitting: Physical Therapy

## 2017-05-06 ENCOUNTER — Ambulatory Visit: Payer: BLUE CROSS/BLUE SHIELD | Admitting: Physical Therapy

## 2017-05-06 DIAGNOSIS — M25661 Stiffness of right knee, not elsewhere classified: Secondary | ICD-10-CM

## 2017-05-06 DIAGNOSIS — M25561 Pain in right knee: Secondary | ICD-10-CM | POA: Diagnosis not present

## 2017-05-06 DIAGNOSIS — R2241 Localized swelling, mass and lump, right lower limb: Secondary | ICD-10-CM

## 2017-05-06 DIAGNOSIS — R262 Difficulty in walking, not elsewhere classified: Secondary | ICD-10-CM

## 2017-05-06 NOTE — Therapy (Signed)
[facility] [place], [place], [address] Phone: [phone]   Fax:  [phone]  Physical Therapy Treatment  Patient Details  Name: [name] MRN: [id number] Date of Birth: 04-25-1956 Referring Provider: Alvan Dame  Encounter Date: 05/06/2017      PT End of Session - 05/06/17 1702    Visit Number 9   Date for PT Re-Evaluation 06/05/17   PT Start Time 1602   PT Stop Time 1704   PT Time Calculation (min) 62 min   Activity Tolerance Patient tolerated treatment well   Behavior During Therapy [facility] for tasks assessed/performed      Past Medical History:  Diagnosis Date  . Acne rosacea   . Acute diverticulitis 09/10/2012  . Allergic rhinitis   . Anemia   . Anxiety   . Colitis   . Diverticulosis   . Family history of adverse reaction to anesthesia    "sister's BP drops way low" (04/18/2017)  . GERD (gastroesophageal reflux disease)   . Goiter   . Hip bursitis   . Hypertension   . Hypothyroidism   . Migraines    "maybe once/year" (04/18/2017)  . Multiple thyroid nodules   . Obesity   . Osteoarthritis    "knees" (04/18/2017)  . Overactive bladder    carpal tunnel   . Shortness of breath    with exertion   . Squamous cell carcinoma 03/2017   RLE  . Tubular adenoma of colon 04/2011  . Wears glasses     Past Surgical History:  Procedure Laterality Date  . CAUTERY OF TURBINATES  1990's  . COLONOSCOPY    . EXCISIONAL HEMORRHOIDECTOMY    . GANGLION CYST EXCISION Right 06/25/2013   Procedure: RIGHT ANKLE GANGLION CYST EXCISION;  Surgeon: Wylene Simmer, MD;  Location: [facility];  Service: Orthopedics;  Laterality: Right;  . INCISION AND DRAINAGE OF WOUND Right 07/23/2013   Procedure: IRRIGATION AND DEBRIDEMENT OF RIGHT ANKLE;  Surgeon: Wylene Simmer, MD;  Location: [facility];  Service: Orthopedics;  Laterality: Right;  Esmark used for tourniquet--on at 1417, off at 1437.  Marland Kitchen KNEE  ARTHROSCOPY Right 11/2010  . NASAL SEPTUM SURGERY  1991  . PARTIAL KNEE ARTHROPLASTY Right 04/01/2017   Procedure: UNICOMPARTMENTAL RIGHT KNEE- Medially;  Surgeon: Paralee Cancel, MD;  [facility];  Service: Orthopedics;  Laterality: Right;  90 mins  . PARTIAL KNEE ARTHROPLASTY Left 04/2009  . PARTIAL KNEE ARTHROPLASTY Right 04/01/2017  . THYROIDECTOMY  05/08/2012   Procedure: THYROIDECTOMY;  Surgeon: Earnstine Regal, MD;  [facility];  Service: General;  Laterality: N/A;  Total Thyriodectomy  . TOTAL ABDOMINAL HYSTERECTOMY  2003   TOTAL ABDOMINAL HYSTERECTOMY W/ BILATERAL SALPINGOOPHORECTOMY  . TUBAL LIGATION    . WOUND DEBRIDEMENT Right 08/2013   ankle    There were no vitals filed for this visit.      Subjective Assessment - 05/06/17 1601    Subjective Pt reports that the MD released her to work part time,    Currently in Pain? No/denies   Pain Score 0-No pain            [facility] PT Assessment - 05/06/17 0001      AROM   AROM Assessment Site Knee   Right/Left Knee Right   Right Knee Extension 4   Right Knee Flexion 110  [facility] Adult PT Treatment/Exercise - 05/06/17 0001      Knee/Hip Exercises: Aerobic   Elliptical I5 R3 23 frd/2rev    Nustep L6 x4 min No UE     Knee/Hip Exercises: Machines for Strengthening   Cybex Knee Extension 10lb 2x10; Eccentrics 5lb x10 RLE    Cybex Knee Flexion 25lb 2x15; RLE 25lb x10   Cybex Leg Press 30# 2x15; TKE RLE 20lb 2x10      Knee/Hip Exercises: Standing   Heel Raises Both;2 sets;15 reps;2 seconds   Lateral Step Up Right;10 reps;Hand Hold: 1;Step Height: 6";2 sets   Gait Training 40lb 4 way x 3 each   Other Standing Knee Exercises Cable pressdowns 50lb 2x15      Knee/Hip Exercises: Seated   Sit to Sand 2 sets;10 reps;without UE support  blue chair     Modalities   Modalities Cryotherapy     Cryotherapy   Number Minutes Cryotherapy 10 Minutes   Cryotherapy Location Knee   Type of  Cryotherapy Ice pack                  PT Short Term Goals - 04/04/17 1031      PT SHORT TERM GOAL #1   Title independent with initial HEP   Time 2   Period Weeks   Status New           PT Long Term Goals - 04/12/17 1104      PT LONG TERM GOAL #1   Title understand and perform RICE   Status Achieved     PT LONG TERM GOAL #2   Title decrease pain 50%   Status Partially Met               Plan - 05/06/17 1702    Clinical Impression Statement No issues with today's exercises, Pt reports that she felt the weakest with resisted side steps.   Rehab Potential Good   PT Frequency 3x / week   PT Duration 8 weeks   PT Treatment/Interventions ADLs/Self Care Home Management;Cryotherapy;Electrical Stimulation;Gait training;Stair training;Functional mobility training;Patient/family education;Balance training;Therapeutic exercise;Therapeutic activities;Vasopneumatic Device;Manual techniques   PT Next Visit Plan slowly work on ROM and mm activtation      Patient will benefit from skilled therapeutic intervention in order to improve the following deficits and impairments:  Abnormal gait, Decreased activity tolerance, Decreased balance, Decreased mobility, Decreased strength, Impaired sensation, Decreased scar mobility, Pain, Decreased range of motion, Difficulty walking  Visit Diagnosis: Acute pain of right knee  Localized swelling, mass and lump, right lower limb  Difficulty in walking, not elsewhere classified  Stiffness of right knee, not elsewhere classified     Problem List Patient Active Problem List   Diagnosis Date Noted  . Colitis 04/20/2017  . Diverticulitis 04/18/2017  . Constipation due to opioid therapy 04/18/2017  . Migraine headache without aura 04/18/2017  . Generalized abdominal pain   . S/P right unicompartmental knee replacement 04/01/2017  . Obesity (BMI 30.0-34.9) 11/27/2012  . Hypothyroidism 09/13/2012  . Acute diverticulitis  09/10/2012  . Hypertension 09/10/2012  . Rosacea 09/10/2012  . Thyroid cancer, follicular variant of papillary thyroid carcinoma 06/09/2012  . Multinodular goiter (nontoxic) 04/17/2012  . GERD 05/26/2009    Scot Jun, PTA 05/06/2017, 5:03 PM  [place] [address] [place], [place], [address] Phone: [phone]   Fax:  [phone]  Name: [name] MRN: [id number] Date of Birth: April 25, 1956

## 2017-05-08 ENCOUNTER — Telehealth: Payer: Self-pay | Admitting: Gastroenterology

## 2017-05-09 ENCOUNTER — Encounter: Payer: Self-pay | Admitting: Physical Therapy

## 2017-05-09 ENCOUNTER — Ambulatory Visit (INDEPENDENT_AMBULATORY_CARE_PROVIDER_SITE_OTHER): Payer: BLUE CROSS/BLUE SHIELD | Admitting: Physician Assistant

## 2017-05-09 ENCOUNTER — Ambulatory Visit: Payer: BLUE CROSS/BLUE SHIELD | Admitting: Physical Therapy

## 2017-05-09 ENCOUNTER — Encounter: Payer: Self-pay | Admitting: Physician Assistant

## 2017-05-09 VITALS — BP 106/68 | HR 92 | Ht 66.5 in | Wt 188.4 lb

## 2017-05-09 DIAGNOSIS — R194 Change in bowel habit: Secondary | ICD-10-CM

## 2017-05-09 DIAGNOSIS — R262 Difficulty in walking, not elsewhere classified: Secondary | ICD-10-CM

## 2017-05-09 DIAGNOSIS — R1032 Left lower quadrant pain: Secondary | ICD-10-CM | POA: Diagnosis not present

## 2017-05-09 DIAGNOSIS — M25561 Pain in right knee: Secondary | ICD-10-CM

## 2017-05-09 DIAGNOSIS — K5732 Diverticulitis of large intestine without perforation or abscess without bleeding: Secondary | ICD-10-CM

## 2017-05-09 DIAGNOSIS — M25661 Stiffness of right knee, not elsewhere classified: Secondary | ICD-10-CM

## 2017-05-09 DIAGNOSIS — R2241 Localized swelling, mass and lump, right lower limb: Secondary | ICD-10-CM

## 2017-05-09 MED ORDER — HYOSCYAMINE SULFATE 0.125 MG SL SUBL: 0.1250 mg | SUBLINGUAL_TABLET | Freq: Four times a day (QID) | SUBLINGUAL | 0 refills | Status: DC | PRN

## 2017-05-09 NOTE — Progress Notes (Signed)
Reviewed and agree with initial management plan.  Mohamedamin Nifong T. Rawad Bochicchio, MD FACG 

## 2017-05-09 NOTE — Therapy (Signed)
[facility], [place], [address] Phone: [phone]   Fax:  [phone]  Physical Therapy Treatment  Patient Details  Name: [name] MRN: [id number] Date of Birth: 02/24/1956 Referring Provider: Alvan Dame  Encounter Date: 05/09/2017      PT End of Session - 05/09/17 1556    Visit Number 10   Date for PT Re-Evaluation 06/05/17   PT Start Time 3846   PT Stop Time 1556   PT Time Calculation (min) 41 min   Activity Tolerance Patient tolerated treatment well   Behavior During Therapy [facility] for tasks assessed/performed      Past Medical History:  Diagnosis Date  . Acne rosacea   . Acute diverticulitis 09/10/2012  . Allergic rhinitis   . Anemia   . Anxiety   . Colitis   . Diverticulosis   . Family history of adverse reaction to anesthesia    "sister's BP drops way low" (04/18/2017)  . GERD (gastroesophageal reflux disease)   . Goiter   . Hip bursitis   . Hypertension   . Hypothyroidism   . Migraines    "maybe once/year" (04/18/2017)  . Multiple thyroid nodules   . Obesity   . Osteoarthritis    "knees" (04/18/2017)  . Overactive bladder    carpal tunnel   . Shortness of breath    with exertion   . Squamous cell carcinoma 03/2017   RLE  . Tubular adenoma of colon 04/2011  . Wears glasses     Past Surgical History:  Procedure Laterality Date  . CAUTERY OF TURBINATES  1990's  . COLONOSCOPY    . EXCISIONAL HEMORRHOIDECTOMY    . GANGLION CYST EXCISION Right 06/25/2013   Procedure: RIGHT ANKLE GANGLION CYST EXCISION;  Surgeon: Wylene Simmer, MD;  Location: [facility];  Service: Orthopedics;  Laterality: Right;  . INCISION AND DRAINAGE OF WOUND Right 07/23/2013   Procedure: IRRIGATION AND DEBRIDEMENT OF RIGHT ANKLE;  Surgeon: Wylene Simmer, MD;  Location: [facility];  Service: Orthopedics;  Laterality: Right;  Esmark used for tourniquet--on at 1417, off at 1437.  Marland Kitchen KNEE  ARTHROSCOPY Right 11/2010  . NASAL SEPTUM SURGERY  1991  . PARTIAL KNEE ARTHROPLASTY Right 04/01/2017   Procedure: UNICOMPARTMENTAL RIGHT KNEE- Medially;  Surgeon: Paralee Cancel, MD;  [facility];  Service: Orthopedics;  Laterality: Right;  90 mins  . PARTIAL KNEE ARTHROPLASTY Left 04/2009  . PARTIAL KNEE ARTHROPLASTY Right 04/01/2017  . THYROIDECTOMY  05/08/2012   Procedure: THYROIDECTOMY;  Surgeon: Earnstine Regal, MD;  [facility];  Service: General;  Laterality: N/A;  Total Thyriodectomy  . TOTAL ABDOMINAL HYSTERECTOMY  2003   TOTAL ABDOMINAL HYSTERECTOMY W/ BILATERAL SALPINGOOPHORECTOMY  . TUBAL LIGATION    . WOUND DEBRIDEMENT Right 08/2013   ankle    There were no vitals filed for this visit.      Subjective Assessment - 05/09/17 1519    Subjective Pt reports that she has went back to work and has worked two full days, she also states that her knee is doing good and she has returned to driving.   Currently in Pain? Yes   Pain Score 2    Pain Location Knee                         [facility] Adult PT Treatment/Exercise - 05/09/17 0001      Ambulation/Gait   Stairs Yes  Stairs Assistance 6: Modified independent (Device/Increase time)   Stair Management Technique One rail Right;Alternating pattern   Number of Stairs 24   Height of Stairs 6   Gait Comments 2 flights     Knee/Hip Exercises: Aerobic   Elliptical I5 R3 50fd/2rev    Nustep L6 x5 min No UE     Knee/Hip Exercises: Machines for Strengthening   Cybex Knee Extension Eccentrics 10lb x10 RLE    Cybex Knee Flexion 25lb 2x15   Cybex Leg Press 40# 2x15; TKE RLE 20lb 2x5      Knee/Hip Exercises: Standing   Lateral Step Up Right;10 reps;Step Height: 6";2 sets;Hand Hold: 0   Step Down Right;1 set;10 reps;Hand Hold: 0;Step Height: 6"   Gait Training 40lb 4 way x 3 each                  PT Short Term Goals - 04/04/17 1031      PT SHORT TERM GOAL #1   Title independent with initial HEP    Time 2   Period Weeks   Status New           PT Long Term Goals - 04/12/17 1104      PT LONG TERM GOAL #1   Title understand and perform RICE   Status Achieved     PT LONG TERM GOAL #2   Title decrease pain 50%   Status Partially Met               Plan - 05/09/17 1557    Clinical Impression Statement Pt demos decrease strength with eccentric loads. PT lands hard on LLR with stair descents. Good strength and ROM on all machine level interventions.    Rehab Potential Good   PT Frequency 3x / week   PT Duration 8 weeks   PT Treatment/Interventions ADLs/Self Care Home Management;Cryotherapy;Electrical Stimulation;Gait training;Stair training;Functional mobility training;Patient/family education;Balance training;Therapeutic exercise;Therapeutic activities;Vasopneumatic Device;Manual techniques   PT Next Visit Plan Gait RLE strength      Patient will benefit from skilled therapeutic intervention in order to improve the following deficits and impairments:  Abnormal gait, Decreased activity tolerance, Decreased balance, Decreased mobility, Decreased strength, Impaired sensation, Decreased scar mobility, Pain, Decreased range of motion, Difficulty walking  Visit Diagnosis: Acute pain of right knee  Localized swelling, mass and lump, right lower limb  Difficulty in walking, not elsewhere classified  Stiffness of right knee, not elsewhere classified     Problem List Patient Active Problem List   Diagnosis Date Noted  . Colitis 04/20/2017  . Diverticulitis 04/18/2017  . Constipation due to opioid therapy 04/18/2017  . Migraine headache without aura 04/18/2017  . Generalized abdominal pain   . S/P right unicompartmental knee replacement 04/01/2017  . Obesity (BMI 30.0-34.9) 11/27/2012  . Hypothyroidism 09/13/2012  . Acute diverticulitis 09/10/2012  . Hypertension 09/10/2012  . Rosacea 09/10/2012  . Thyroid cancer, follicular variant of papillary thyroid  carcinoma 06/09/2012  . Multinodular goiter (nontoxic) 04/17/2012  . GERD 05/26/2009    RScot Jun PTA 05/09/2017, 4:00 PM  [place] [address]: [phone]  Fax:  [phone] Name: Robin MYHANDMRN: [id number] of Birth: [birth year]

## 2017-05-09 NOTE — Telephone Encounter (Signed)
Patient with continued abdominal pain and constipation.  She was recently admitted for diverticulitis and constipation.  She will come in this am and see Ellouise Newer, [place] at 11:15

## 2017-05-09 NOTE — Progress Notes (Signed)
Chief Complaint: Continue Abdominal Pain  HPI:  [name] is a 61 year old female with a past medical history as listed below who regularly follows with Dr. Fuller Plan and presents to clinic today with continued abdominal pain after hospitalization for diverticulitis in early August.   Per review of chart patient was admitted to the hospital 04/18/17-04/22/17 for diverticulitis. During that time patient had a CT abdomen and pelvis with contrast 04/18/17 which showed a long segment wall thickening and pericolonic fat stranding of the distal descending and proximal sigmoid colon. Favored to represent acute on complicated diverticulitis given the presence of several inflamed diverticula. Length of the colon involved, however, is greater than typical for diverticulitis, and additional etiologies such as infectious or inflammatory colitis should be considered. No evidence of perforation or drainable fluid collection. No evidence of polyp structures clinically. At that time patient was status post right knee replacement 04/02/17. Her last colonoscopy was 2017. Patient was continued on IV Cipro and Flagyl and started on MiraLAX twice a day. It was thought that she likely had diverticulitis and improved with twice a day MiraLAX with Senokot and antibiotics.    Today, the patient presents to clinic and tells me that she finished her antibiotics last weekend, since that time she was continued with a bilateral lower abdominal comfort which seems constant. She tells me that when she went to the hospital, this pain was an 8-9/10 and currently this is only 1.5-2/10. She describes that she has remained with a somewhat soft stool throughout this time. She is still using her MiraLAX twice a day and stool softener at night. She also has occasional nausea. Patient has maintained a soft diet, but did try to increase this Monday through Wednesday of this week and became somewhat constipated and had increased abdominal discomfort, so she  is now back to a soft diet. Patient also saw a small amount of bright red blood when she wiped yesterday after straining for bowel movement. The patient wonders whether this is still her diverticulitis or if it is now her IBS. She would like further recommendations. She does describe that she has been trying to take Hyoscyamine 0.125 mg tabs, but they are not helping. Patient tells me she used to use these for her IBS but when they were switched from sublingual to oral tabs, they no longer helped. She does remain on her daily probiotic "Restore", which she believes does help because if she misses this for 1-2 days she notices a difference.   Patient does admit to having an anxious stomach noting that she can feel her lower abdomen "knotting up" if she gets stressed or anxious. She's also had 3 panic attacks this year.   Patient denies fever, chills, melena, weight loss, anorexia, nausea, vomiting, heartburn, reflux, dysphagia or symptoms that awaken her at night.  Past Medical History:  Diagnosis Date  . Acne rosacea   . Acute diverticulitis 09/10/2012  . Allergic rhinitis   . Anemia   . Anxiety   . Colitis   . Diverticulosis   . Family history of adverse reaction to anesthesia    "sister's BP drops way low" (04/18/2017)  . GERD (gastroesophageal reflux disease)   . Goiter   . Hip bursitis   . Hypertension   . Hypothyroidism   . Migraines    "maybe once/year" (04/18/2017)  . Multiple thyroid nodules   . Obesity   . Osteoarthritis    "knees" (04/18/2017)  . Overactive bladder    carpal tunnel   .  Shortness of breath    with exertion   . Squamous cell carcinoma 03/2017   RLE  . Tubular adenoma of colon 04/2011  . Wears glasses     Past Surgical History:  Procedure Laterality Date  . CAUTERY OF TURBINATES  1990's  . COLONOSCOPY    . EXCISIONAL HEMORRHOIDECTOMY    . GANGLION CYST EXCISION Right 06/25/2013   Procedure: RIGHT ANKLE GANGLION CYST EXCISION;  Surgeon: Wylene Simmer, MD;   Location: [facility];  Service: Orthopedics;  Laterality: Right;  . INCISION AND DRAINAGE OF WOUND Right 07/23/2013   Procedure: IRRIGATION AND DEBRIDEMENT OF RIGHT ANKLE;  Surgeon: Wylene Simmer, MD;  Location: [facility];  Service: Orthopedics;  Laterality: Right;  Esmark used for tourniquet--on at 1417, off at 1437.  Marland Kitchen KNEE ARTHROSCOPY Right 11/2010  . NASAL SEPTUM SURGERY  1991  . PARTIAL KNEE ARTHROPLASTY Right 04/01/2017   Procedure: UNICOMPARTMENTAL RIGHT KNEE- Medially;  Surgeon: Paralee Cancel, MD;  Location: [facility];  Service: Orthopedics;  Laterality: Right;  90 mins  . PARTIAL KNEE ARTHROPLASTY Left 04/2009  . PARTIAL KNEE ARTHROPLASTY Right 04/01/2017  . THYROIDECTOMY  05/08/2012   Procedure: THYROIDECTOMY;  Surgeon: Earnstine Regal, MD;  Location: [facility];  Service: General;  Laterality: N/A;  Total Thyriodectomy  . TOTAL ABDOMINAL HYSTERECTOMY  2003   TOTAL ABDOMINAL HYSTERECTOMY W/ BILATERAL SALPINGOOPHORECTOMY  . TUBAL LIGATION    . WOUND DEBRIDEMENT Right 08/2013   ankle    Current Outpatient Prescriptions  Medication Sig Dispense Refill  . Calcium Carbonate-Vit D-Min (CALCIUM 1200 PO) Take 1,200 Units by mouth daily.    . fluticasone (FLONASE) 50 MCG/ACT nasal spray Place 2 sprays into the nose daily as needed for allergies.     . hyoscyamine (LEVSIN SL) 0.125 MG SL tablet Take 1 tablet (0.125 mg total) by mouth every 6 (six) hours as needed. cramping 90 tablet 0  . irbesartan (AVAPRO) 300 MG tablet Take 1 tablet (300 mg total) by mouth daily. 30 tablet 3  . lansoprazole (PREVACID) 15 MG capsule Take 15 mg by mouth daily.    Marland Kitchen levothyroxine (SYNTHROID, LEVOTHROID) 175 MCG tablet Take 175 mcg by mouth at bedtime.    Marland Kitchen LORazepam (ATIVAN) 0.5 MG tablet Take 0.5 mg by mouth 2 (two) times daily as needed for anxiety.     . metroNIDAZOLE (METROCREAM) 0.75 % cream Apply 1 application topically 2 (two) times daily.     . ondansetron (ZOFRAN ODT) 4 MG  disintegrating tablet Take 1 tablet (4 mg total) by mouth every 8 (eight) hours as needed for nausea or vomiting. 20 tablet 0  . polyethylene glycol (MIRALAX / GLYCOLAX) packet Take 17 g by mouth daily as needed for moderate constipation. (Patient taking differently: Take 17 g by mouth 2 (two) times daily. ) 30 each 2  . Probiotic Product (RESTORA) CAPS TAKE ONE CAPSULE BY MOUTH EVERY DAY (NEED APPT FOR FURTHER REFILLS) (Patient taking differently: Take 1 capsule by mouth daily. TAKE ONE CAPSULE BY MOUTH EVERY DAY (NEED APPT FOR FURTHER REFILLS)) 30 capsule 0  . senna-docusate (SENOKOT S) 8.6-50 MG tablet Take 1 tablet by mouth at bedtime. For constipation 30 tablet 1  . UNABLE TO FIND OUTPATIENT PHYSICAL THERAPY   Diagnosis: partial TKA 04/01/17 on RIGHT, ac diverticulitis 1 Mutually Defined 0  . valACYclovir (VALTREX) 500 MG tablet Take 1,000 mg by mouth 2 (two) times daily as needed (fever blisters).    . zolpidem (AMBIEN) 5 MG tablet  Take 2.5 mg by mouth at bedtime as needed for sleep.     No current facility-administered medications for this visit.     Allergies as of 05/09/2017 - Review Complete 05/09/2017  Allergen Reaction Noted  . Cephalexin Hives   . Macrobid [nitrofurantoin macrocrystal] Nausea And Vomiting and Other (See Comments) 03/21/2017  . Zithromax [azithromycin] Other (See Comments) 04/25/2012  . Tramadol Nausea Only 05/09/2017    Family History  Problem Relation Age of Onset  . Colon cancer Maternal Grandfather   . Diabetes Mother   . Heart disease Mother   . Breast cancer Mother   . Pulmonary embolism Father   . Hypertension Sister     Social History   Social History  . Marital status: Married    Spouse name: N/A  . Number of children: 3  . Years of education: N/A   Occupational History  . Speech pathology    Social History Main Topics  . Smoking status: Never Smoker  . Smokeless tobacco: Never Used  . Alcohol use No  . Drug use: No  . Sexual  activity: Yes   Other Topics Concern  . Not on file   Social History Narrative  . No narrative on file    Review of Systems:    Constitutional: No weight loss, fever or chills Cardiovascular: No chest pain Respiratory: No SOB  Gastrointestinal: See HPI and otherwise negative Genitourinary: No dysuria  Neurological: No headache Musculoskeletal: No new muscle or joint pain Hematologic: No bruising Psychiatric: Positive for anxiety    Physical Exam:  Vital signs: BP 106/68 (BP Location: Left Arm, Patient Position: Sitting, Cuff Size: Normal)   Pulse 92   Ht 5' 6.5" (1.689 m)   Wt 188 lb 6 oz (85.4 kg)   BMI 29.95 kg/m    Constitutional:   Pleasant Caucasian female appears to be in NAD, Well developed, Well nourished, alert and cooperative Respiratory: Respirations even and unlabored. Lungs clear to auscultation bilaterally.   No wheezes, crackles, or rhonchi.  Cardiovascular: Normal S1, S2. No MRG. Regular rate and rhythm. No peripheral edema, cyanosis or pallor.  Gastrointestinal:  Soft, nondistended, mild b/l lower quadrant ttp, worse in LLQ, No rebound or guarding. Normal bowel sounds. No appreciable masses or hepatomegaly. Psychiatric:Demonstrates good judgement and reason without abnormal affect or behaviors.  MOST RECENT LABS AND IMAGING: CBC    Component Value Date/Time   WBC 6.6 04/22/2017 0440   RBC 4.05 04/22/2017 0440   HGB 12.1 04/22/2017 0440   HCT 37.2 04/22/2017 0440   PLT 381 04/22/2017 0440   MCV 91.9 04/22/2017 0440   MCH 29.9 04/22/2017 0440   MCHC 32.5 04/22/2017 0440   RDW 14.1 04/22/2017 0440   LYMPHSABS 0.9 09/10/2012 0755   MONOABS 0.9 09/10/2012 0755   EOSABS 0.0 09/10/2012 0755   BASOSABS 0.0 09/10/2012 0755    CMP     Component Value Date/Time   NA 140 04/23/2017 0453   K 3.7 04/23/2017 0453   CL 107 04/23/2017 0453   CO2 26 04/23/2017 0453   GLUCOSE 104 (H) 04/23/2017 0453   BUN 8 04/23/2017 0453   CREATININE 0.83 04/23/2017  0453   CALCIUM 8.6 (L) 04/23/2017 0453   PROT 7.3 09/10/2012 0755   ALBUMIN 4.0 09/10/2012 0755   AST 49 (H) 09/10/2012 0755   ALT 49 (H) 09/10/2012 0755   ALKPHOS 130 (H) 09/10/2012 0755   BILITOT 0.4 09/10/2012 0755   GFRNONAA >60 04/23/2017 0453   GFRAA >60  04/23/2017 0453   CT ABDOMEN AND PELVIS WITH CONTRAST 04/18/17  TECHNIQUE: Multidetector CT imaging of the abdomen and pelvis was performed using the standard protocol following bolus administration of intravenous contrast.  CONTRAST:  100 cc Isovue-300 intravenous contrast.  COMPARISON:  CT abdomen and pelvis dated September 10, 2012.  FINDINGS: Lower chest: Bibasilar atelectasis.  No acute abnormality.  Hepatobiliary: No focal liver abnormality is seen. No gallstones, gallbladder wall thickening, or biliary dilatation.  Pancreas: Unremarkable. No pancreatic ductal dilatation or surrounding inflammatory changes.  Spleen: Normal in size without focal abnormality.  Adrenals/Urinary Tract: Adrenal glands are unremarkable. Kidneys are normal, without renal calculi, focal lesion, or hydronephrosis. Bladder is unremarkable.  Stomach/Bowel: Extensive diverticulosis with long segment prominent wall thickening and pericolonic fat stranding of the distal descending and proximal sigmoid colon. No pneumoperitoneum or drainable fluid collection.  The stomach and small bowel are unremarkable.  No obstruction.  Vascular/Lymphatic: No significant vascular findings are present. No enlarged abdominal or pelvic lymph nodes.  Reproductive: Status post hysterectomy. No adnexal masses.  Other: No abdominal wall hernia or abnormality. Trace free fluid in the left paracolic gutter and pelvis.  Musculoskeletal: No acute or significant osseous findings.  IMPRESSION: 1. Long segment wall thickening and pericolonic fat stranding of the distal descending and proximal sigmoid colon, favored to represent acute uncomplicated  diverticulitis given the presence of several inflamed diverticula. The length of the involved colon, however, is greater than typical for diverticulitis, and additional etiologies such as infectious or inflammatory colitis should be considered. No evidence of perforation or drainable fluid collection. 2. No evidence of bowel obstruction as clinically queried.   Electronically Signed   By: Titus Dubin M.D.   On: 04/18/2017 14:33  Assessment: 1. Left lower quadrant abdominal pain: Decreased from an 8/10 to a 1.5/10 after treatment with Cipro and Flagyl for diverticulitis seen on CT above, patient questions whether this is normal for diverticulitis or whether it is now her IBS, also continues with an alteration of bowel habits towards diarrhea on a stool softener and MiraLAX twice a day; consider continued diverticulitis/smoldering diverticulitis versus IBS 2. Change in bowel habits 4. History of recent diverticulitis  Plan: 1. Ordered repeat CT abdomen and pelvis with contrast. 2. Recommend the patient maintain on a low fiber/low residue diet, provided her with a handout. 3. Prescribed Hyoscyamine sulfate 0.125 mg sublingual tabs to be taken every 4-6 hours as needed for abdominal pain 4. Patient to continue her Restora probiotic, we did discuss a switch to VSL #3, she would like to wait on this for now 5. Patient will stop her stool softener and continue MiraLAX twice a day. Did discuss that she can titrate this down as necessary as well, we do not need diarrhea, just soft regular stools 6. Discussed with the patient there is likely some have overlap between her IBS and what sounds like smoldering diverticulitis, If CT continues to show inflammation, will prescribe another round of antibiotics 7. Patient to follow in clinic with me or Dr. Fuller Plan in 2-3 weeks or sooner if necessary.  Ellouise Newer, PA-C [facility] 05/09/2017, 11:30 AM  Cc: Harlan Stains, MD

## 2017-05-09 NOTE — Patient Instructions (Addendum)
Low fiber/low residue handout.   We have sent the following medications to your pharmacy for you to pick up at your convenience: Hyoscyamine 0.125 mg sublingual tablet every 4-6 hours as needed for abdominal pain or cramping  Stop your stool softner.   Continue Miralax twice a day.   Continue Probiotic.   You have been scheduled for a CT scan of the abdomen and pelvis at [facility] ([address]---this is in the same building as [facility]).   You are scheduled on 05/16/17 at 3:30 pm. You should arrive 15 minutes prior to your appointment time for registration. Please follow the written instructions below on the day of your exam:  WARNING: IF YOU ARE ALLERGIC TO IODINE/X-RAY DYE, PLEASE NOTIFY RADIOLOGY IMMEDIATELY AT [phone]! YOU WILL BE GIVEN A 13 HOUR PREMEDICATION PREP.  1) Do not eat anything after 11:30 am (4 hours prior to your test) 2) You have been given 2 bottles of oral contrast to drink. The solution may taste better if refrigerated, but do NOT add ice or any other liquid to this solution. Shake well before drinking.    Drink 1 bottle of contrast @ 1:30 pm (2 hours prior to your exam)  Drink 1 bottle of contrast @ 2:30 pm (1 hour prior to your exam)  You may take any medications as prescribed with a small amount of water except for the following: Metformin, Glucophage, Glucovance, Avandamet, Riomet, Fortamet, Actoplus Met, Janumet, Glumetza or Metaglip. The above medications must be held the day of the exam AND 48 hours after the exam.  The purpose of you drinking the oral contrast is to aid in the visualization of your intestinal tract. The contrast solution may cause some diarrhea. Before your exam is started, you will be given a small amount of fluid to drink. Depending on your individual set of symptoms, you may also receive an intravenous injection of x-ray contrast/dye. Plan on being at [facility] for 30 minutes or longer, depending on  the type of exam you are having performed.  This test typically takes 30-45 minutes to complete.  If you have any questions regarding your exam or if you need to reschedule, you may call the CT department at [phone] between the hours of 8:00 am and 5:00 pm, Monday-Friday.  ________________________________________________________________________

## 2017-05-10 ENCOUNTER — Ambulatory Visit: Payer: BLUE CROSS/BLUE SHIELD | Admitting: Physical Therapy

## 2017-05-10 DIAGNOSIS — R262 Difficulty in walking, not elsewhere classified: Secondary | ICD-10-CM

## 2017-05-10 DIAGNOSIS — M25561 Pain in right knee: Secondary | ICD-10-CM

## 2017-05-10 DIAGNOSIS — R2241 Localized swelling, mass and lump, right lower limb: Secondary | ICD-10-CM

## 2017-05-10 DIAGNOSIS — M25661 Stiffness of right knee, not elsewhere classified: Secondary | ICD-10-CM

## 2017-05-10 NOTE — Therapy (Signed)
[address] [place], [place], [address] Phone: [phone]   Fax:  [phone]  Physical Therapy Treatment  Patient Details  Name: [name] MRN: [id number] Date of Birth: Dec 06, 1955 Referring Provider: Alvan Dame  Encounter Date: 05/10/2017      PT End of Session - 05/10/17 0938    Visit Number 11   Date for PT Re-Evaluation 06/05/17   PT Start Time 0807   PT Stop Time 0900   PT Time Calculation (min) 53 min   Activity Tolerance Patient tolerated treatment well   Behavior During Therapy [facility] for tasks assessed/performed      Past Medical History:  Diagnosis Date   Acne rosacea    Acute diverticulitis 09/10/2012   Allergic rhinitis    Anemia    Anxiety    Colitis    Diverticulosis    Family history of adverse reaction to anesthesia    "sister's BP drops way low" (04/18/2017)   GERD (gastroesophageal reflux disease)    Goiter    Hip bursitis    Hypertension    Hypothyroidism    Migraines    "maybe once/year" (04/18/2017)   Multiple thyroid nodules    Obesity    Osteoarthritis    "knees" (04/18/2017)   Overactive bladder    carpal tunnel    Shortness of breath    with exertion    Squamous cell carcinoma 03/2017   RLE   Tubular adenoma of colon 04/2011   Wears glasses     Past Surgical History:  Procedure Laterality Date   CAUTERY OF TURBINATES  1990's   COLONOSCOPY     EXCISIONAL HEMORRHOIDECTOMY     GANGLION CYST EXCISION Right 06/25/2013   Procedure: RIGHT ANKLE GANGLION CYST EXCISION;  Surgeon: Wylene Simmer, MD;  Location: [facility];  Service: Orthopedics;  Laterality: Right;   INCISION AND DRAINAGE OF WOUND Right 07/23/2013   Procedure: IRRIGATION AND DEBRIDEMENT OF RIGHT ANKLE;  Surgeon: Wylene Simmer, MD;  Location: [facility];  Service: Orthopedics;  Laterality: Right;  Esmark used for tourniquet--on at 1417, off at 1437.   KNEE  ARTHROSCOPY Right 11/2010   NASAL SEPTUM SURGERY  1991   PARTIAL KNEE ARTHROPLASTY Right 04/01/2017   Procedure: UNICOMPARTMENTAL RIGHT KNEE- Medially;  Surgeon: Paralee Cancel, MD;  Location: [facility];  Service: Orthopedics;  Laterality: Right;  90 mins   PARTIAL KNEE ARTHROPLASTY Left 04/2009   PARTIAL KNEE ARTHROPLASTY Right 04/01/2017   THYROIDECTOMY  05/08/2012   Procedure: THYROIDECTOMY;  Surgeon: Earnstine Regal, MD;  Location: [facility];  Service: General;  Laterality: N/A;  Total Thyriodectomy   TOTAL ABDOMINAL HYSTERECTOMY  2003   TOTAL ABDOMINAL HYSTERECTOMY W/ BILATERAL SALPINGOOPHORECTOMY   TUBAL LIGATION     WOUND DEBRIDEMENT Right 08/2013   ankle    There were no vitals filed for this visit.      Subjective Assessment - 05/10/17 0809    Subjective Work has been going pretty well. Knee is feeling pretty well.  A little stiff still, particularly with decreased activity.                          [facility] PT Treatment/Exercise - 05/10/17 0001      Knee/Hip Exercises: Aerobic   Recumbent Bike seat 9 x 6 min full rev rev     Knee/Hip Exercises: Standing   Terminal Knee Extension Limitations 5"x15  Forward Step Up Right;2 sets;10 reps;Step Height: 6";Hand Hold: 2   Functional Squat 2 sets;10 reps   Gait Training 40lb 4 way x 5each   Other Standing Knee Exercises standing calf stretch 30" x3     Modalities   Modalities Cryotherapy     Cryotherapy   Number Minutes Cryotherapy 10 Minutes   Cryotherapy Location Knee   Type of Cryotherapy Ice pack     Manual Therapy   Manual Therapy Passive ROM   Manual therapy comments gentle scar mobilizations   Passive ROM PROM in sitting, working only on flexion                  PT Short Term Goals - 04/04/17 1031      PT SHORT TERM GOAL #1   Title independent with initial HEP   Time 2   Period Weeks   Status New           PT Long Term Goals - 04/12/17 1104      PT LONG TERM GOAL #1    Title understand and perform RICE   Status Achieved     PT LONG TERM GOAL #2   Title decrease pain 50%   Status Partially Met               Plan - 05/10/17 0939    Clinical Impression Statement Tolerates treatment well. Cont to lack some flexion ROM.  In standing does not utilize all available knee exetension ROM lacking hip/ knee extension synergy during gait.        Patient will benefit from skilled therapeutic intervention in order to improve the following deficits and impairments:     Visit Diagnosis: Acute pain of right knee  Localized swelling, mass and lump, right lower limb  Difficulty in walking, not elsewhere classified  Stiffness of right knee, not elsewhere classified     Problem List Patient Active Problem List   Diagnosis Date Noted   Colitis 04/20/2017   Diverticulitis 04/18/2017   Constipation due to opioid therapy 04/18/2017   Migraine headache without aura 04/18/2017   Generalized abdominal pain    S/P right unicompartmental knee replacement 04/01/2017   Obesity (BMI 30.0-34.9) 11/27/2012   Hypothyroidism 09/13/2012   Acute diverticulitis 09/10/2012   Hypertension 09/10/2012   Rosacea 09/10/2012   Thyroid cancer, follicular variant of papillary thyroid carcinoma 06/09/2012   Multinodular goiter (nontoxic) 04/17/2012   GERD 05/26/2009    Olean Ree, PTA 05/10/2017, 9:41 AM  [address] [place], [place], [address] Phone: [phone]   Fax:  [phone]  Name: [name] MRN: [id number] Date of Birth: March 27, 1956

## 2017-05-15 ENCOUNTER — Encounter: Payer: Self-pay | Admitting: Physical Therapy

## 2017-05-15 ENCOUNTER — Ambulatory Visit: Payer: BLUE CROSS/BLUE SHIELD | Attending: Orthopedic Surgery | Admitting: Physical Therapy

## 2017-05-15 DIAGNOSIS — M25561 Pain in right knee: Secondary | ICD-10-CM | POA: Insufficient documentation

## 2017-05-15 DIAGNOSIS — M25661 Stiffness of right knee, not elsewhere classified: Secondary | ICD-10-CM | POA: Diagnosis present

## 2017-05-15 DIAGNOSIS — R2241 Localized swelling, mass and lump, right lower limb: Secondary | ICD-10-CM | POA: Insufficient documentation

## 2017-05-15 DIAGNOSIS — R262 Difficulty in walking, not elsewhere classified: Secondary | ICD-10-CM | POA: Insufficient documentation

## 2017-05-15 NOTE — Therapy (Signed)
[place], [place], [address] Phone: [phone]   Fax:  [phone]  Physical Therapy Treatment  Patient Details  Name: [name] MRN: [id number] Date of Birth: Jan 24, 1956 Referring Provider: Alvan Dame  Encounter Date: 05/15/2017      PT End of Session - 05/15/17 1227    Visit Number 12   Date for PT Re-Evaluation 06/05/17   PT Start Time 6606   PT Stop Time 1227   PT Time Calculation (min) 42 min   Activity Tolerance Patient tolerated treatment well   Behavior During Therapy [facility] for tasks assessed/performed      Past Medical History:  Diagnosis Date  . Acne rosacea   . Acute diverticulitis 09/10/2012  . Allergic rhinitis   . Anemia   . Anxiety   . Colitis   . Diverticulosis   . Family history of adverse reaction to anesthesia    "sister's BP drops way low" (04/18/2017)  . GERD (gastroesophageal reflux disease)   . Goiter   . Hip bursitis   . Hypertension   . Hypothyroidism   . Migraines    "maybe once/year" (04/18/2017)  . Multiple thyroid nodules   . Obesity   . Osteoarthritis    "knees" (04/18/2017)  . Overactive bladder    carpal tunnel   . Shortness of breath    with exertion   . Squamous cell carcinoma 03/2017   RLE  . Tubular adenoma of colon 04/2011  . Wears glasses     Past Surgical History:  Procedure Laterality Date  . CAUTERY OF TURBINATES  1990's  . COLONOSCOPY    . EXCISIONAL HEMORRHOIDECTOMY    . GANGLION CYST EXCISION Right 06/25/2013   Procedure: RIGHT ANKLE GANGLION CYST EXCISION;  Surgeon: Wylene Simmer, MD;  Location: [place];  Service: Orthopedics;  Laterality: Right;  . INCISION AND DRAINAGE OF WOUND Right 07/23/2013   Procedure: IRRIGATION AND DEBRIDEMENT OF RIGHT ANKLE;  Surgeon: Wylene Simmer, MD;  Location: [place];  Service: Orthopedics;  Laterality: Right;  Esmark used for tourniquet--on at 1417, off at 1437.  Marland Kitchen KNEE  ARTHROSCOPY Right 11/2010  . NASAL SEPTUM SURGERY  1991  . PARTIAL KNEE ARTHROPLASTY Right 04/01/2017   Procedure: UNICOMPARTMENTAL RIGHT KNEE- Medially;  Surgeon: Paralee Cancel, MD;  [facility];  Service: Orthopedics;  Laterality: Right;  90 mins  . PARTIAL KNEE ARTHROPLASTY Left 04/2009  . PARTIAL KNEE ARTHROPLASTY Right 04/01/2017  . THYROIDECTOMY  05/08/2012   Procedure: THYROIDECTOMY;  Surgeon: Earnstine Regal, MD;  Location: [facility];  Service: General;  Laterality: N/A;  Total Thyriodectomy  . TOTAL ABDOMINAL HYSTERECTOMY  2003   TOTAL ABDOMINAL HYSTERECTOMY W/ BILATERAL SALPINGOOPHORECTOMY  . TUBAL LIGATION    . WOUND DEBRIDEMENT Right 08/2013   ankle    There were no vitals filed for this visit.      Subjective Assessment - 05/15/17 1152    Subjective Pt reports that things are going good and she goes to MD this afternoon    Currently in Pain? No/denies   Pain Score 0-No pain   Pain Location Knee   Pain Orientation Right   Pain Descriptors / Indicators Tightness            [facility] PT Assessment - 05/15/17 0001      AROM   AROM Assessment Site Knee   Right/Left Knee Right   Right Knee Extension 3   Right Knee  Flexion 111                     [facility] Adult PT Treatment/Exercise - 05/15/17 0001      Knee/Hip Exercises: Aerobic   Elliptical I5 R3 22fd/2rev    Nustep L6 x5 min No UE     Knee/Hip Exercises: Machines for Strengthening   Cybex Leg Press 40# 2x15; TKE RLE 20lb 2x10     Knee/Hip Exercises: Standing   Terminal Knee Extension Limitations 5"x15   Lateral Step Up Right;10 reps;Step Height: 6";2 sets;Hand Hold: 0   Forward Step Up Right;2 sets;10 reps;Step Height: 6";Hand Hold: 2   Step Down Right;1 set;10 reps;Hand Hold: 0;Step Height: 6"  weakness on 2nd set attempt   Functional Squat 1 set;10 reps   Gait Training 40lb 4 way x 5each                  PT Short Term Goals - 04/04/17 1031      PT SHORT TERM GOAL #1   Title  independent with initial HEP   Time 2   Period Weeks   Status New           PT Long Term Goals - 04/12/17 1104      PT LONG TERM GOAL #1   Title understand and perform RICE   Status Achieved     PT LONG TERM GOAL #2   Title decrease pain 50%   Status Partially Met               Plan - 05/15/17 1228    Clinical Impression Statement Some progression with R knee AROM, but lacks some flexion ROM. Pt reports that her led does not feel like her own with step ups and resisted gait. no reports of increase pain with today's exercises.   Rehab Potential Good   PT Frequency 3x / week   PT Duration 8 weeks   PT Treatment/Interventions ADLs/Self Care Home Management;Cryotherapy;Electrical Stimulation;Gait training;Stair training;Functional mobility training;Patient/family education;Balance training;Therapeutic exercise;Therapeutic activities;Vasopneumatic Device;Manual techniques   PT Next Visit Plan Gait RLE strenght      Patient will benefit from skilled therapeutic intervention in order to improve the following deficits and impairments:  Abnormal gait, Decreased activity tolerance, Decreased balance, Decreased mobility, Decreased strength, Impaired sensation, Decreased scar mobility, Pain, Decreased range of motion, Difficulty walking  Visit Diagnosis: Acute pain of right knee  Localized swelling, mass and lump, right lower limb  Difficulty in walking, not elsewhere classified  Stiffness of right knee, not elsewhere classified     Problem List Patient Active Problem List   Diagnosis Date Noted  . Colitis 04/20/2017  . Diverticulitis 04/18/2017  . Constipation due to opioid therapy 04/18/2017  . Migraine headache without aura 04/18/2017  . Generalized abdominal pain   . S/P right unicompartmental knee replacement 04/01/2017  . Obesity (BMI 30.0-34.9) 11/27/2012  . Hypothyroidism 09/13/2012  . Acute diverticulitis 09/10/2012  . Hypertension 09/10/2012  . Rosacea  09/10/2012  . Thyroid cancer, follicular variant of papillary thyroid carcinoma 06/09/2012  . Multinodular goiter (nontoxic) 04/17/2012  . GERD 05/26/2009    RScot Jun PTA 05/15/2017, 12:30 PM  [place] [address]Phone: [phone]  Fax:  [phone] Name: Robin LASETERMRN: [id number] of Birth: [birth year]

## 2017-05-16 ENCOUNTER — Encounter: Payer: Self-pay | Admitting: Physical Therapy

## 2017-05-16 ENCOUNTER — Ambulatory Visit: Payer: BLUE CROSS/BLUE SHIELD | Admitting: Physical Therapy

## 2017-05-16 ENCOUNTER — Ambulatory Visit (INDEPENDENT_AMBULATORY_CARE_PROVIDER_SITE_OTHER)
Admission: RE | Admit: 2017-05-16 | Discharge: 2017-05-16 | Disposition: A | Discharge status: Home / Self Care | Payer: BLUE CROSS/BLUE SHIELD | Source: Ambulatory Visit | Attending: Physician Assistant | Admitting: Physician Assistant

## 2017-05-16 DIAGNOSIS — R1032 Left lower quadrant pain: Secondary | ICD-10-CM

## 2017-05-16 DIAGNOSIS — K5732 Diverticulitis of large intestine without perforation or abscess without bleeding: Secondary | ICD-10-CM | POA: Diagnosis not present

## 2017-05-16 DIAGNOSIS — R2241 Localized swelling, mass and lump, right lower limb: Secondary | ICD-10-CM

## 2017-05-16 DIAGNOSIS — M25661 Stiffness of right knee, not elsewhere classified: Secondary | ICD-10-CM

## 2017-05-16 DIAGNOSIS — R262 Difficulty in walking, not elsewhere classified: Secondary | ICD-10-CM

## 2017-05-16 DIAGNOSIS — M25561 Pain in right knee: Secondary | ICD-10-CM | POA: Diagnosis not present

## 2017-05-16 MED ORDER — IOPAMIDOL (ISOVUE-300) INJECTION 61%
100.0000 mL | Freq: Once | INTRAVENOUS | Status: AC | PRN
  Administered 2017-05-16: 100 mL via INTRAVENOUS

## 2017-05-16 NOTE — Therapy (Signed)
[place], [place], [address] Phone: [phone]   Fax:  [phone]  Physical Therapy Treatment  Patient Details  Name: [name] MRN: [id number] Date of Birth: 1956/07/05 Referring Provider: Alvan Dame  Encounter Date: 05/16/2017      PT End of Session - 05/16/17 0843    Visit Number 13   Date for PT Re-Evaluation 06/05/17   PT Start Time 0803   PT Stop Time 0845   PT Time Calculation (min) 42 min   Activity Tolerance Patient tolerated treatment well   Behavior During Therapy [facility] for tasks assessed/performed      Past Medical History:  Diagnosis Date  . Acne rosacea   . Acute diverticulitis 09/10/2012  . Allergic rhinitis   . Anemia   . Anxiety   . Colitis   . Diverticulosis   . Family history of adverse reaction to anesthesia    "sister's BP drops way low" (04/18/2017)  . GERD (gastroesophageal reflux disease)   . Goiter   . Hip bursitis   . Hypertension   . Hypothyroidism   . Migraines    "maybe once/year" (04/18/2017)  . Multiple thyroid nodules   . Obesity   . Osteoarthritis    "knees" (04/18/2017)  . Overactive bladder    carpal tunnel   . Shortness of breath    with exertion   . Squamous cell carcinoma 03/2017   RLE  . Tubular adenoma of colon 04/2011  . Wears glasses     Past Surgical History:  Procedure Laterality Date  . CAUTERY OF TURBINATES  1990's  . COLONOSCOPY    . EXCISIONAL HEMORRHOIDECTOMY    . GANGLION CYST EXCISION Right 06/25/2013   Procedure: RIGHT ANKLE GANGLION CYST EXCISION;  Surgeon: Wylene Simmer, MD;  Location: [place];  Service: Orthopedics;  Laterality: Right;  . INCISION AND DRAINAGE OF WOUND Right 07/23/2013   Procedure: IRRIGATION AND DEBRIDEMENT OF RIGHT ANKLE;  Surgeon: Wylene Simmer, MD;  Location: [place];  Service: Orthopedics;  Laterality: Right;  Esmark used for tourniquet--on at 1417, off at 1437.  Marland Kitchen KNEE  ARTHROSCOPY Right 11/2010  . NASAL SEPTUM SURGERY  1991  . PARTIAL KNEE ARTHROPLASTY Right 04/01/2017   Procedure: UNICOMPARTMENTAL RIGHT KNEE- Medially;  Surgeon: Paralee Cancel, MD;  [facility];  Service: Orthopedics;  Laterality: Right;  90 mins  . PARTIAL KNEE ARTHROPLASTY Left 04/2009  . PARTIAL KNEE ARTHROPLASTY Right 04/01/2017  . THYROIDECTOMY  05/08/2012   Procedure: THYROIDECTOMY;  Surgeon: Earnstine Regal, MD;  Location[facility];  Service: General;  Laterality: N/A;  Total Thyriodectomy  . TOTAL ABDOMINAL HYSTERECTOMY  2003   TOTAL ABDOMINAL HYSTERECTOMY W/ BILATERAL SALPINGOOPHORECTOMY  . TUBAL LIGATION    . WOUND DEBRIDEMENT Right 08/2013   ankle    There were no vitals filed for this visit.      Subjective Assessment - 05/16/17 0809    Subjective Pt reports things are going great, MD is pleases   Currently in Pain? No/denies   Pain Score 0-No pain                         [facility] Adult PT Treatment/Exercise - 05/16/17 0001      Knee/Hip Exercises: Aerobic   Elliptical 10  R5 29fd/2rev    Recumbent Bike L2 x 4 min     Knee/Hip Exercises: Machines for Strengthening   Cybex  Knee Extension 15lb x10 "Reports L knee pain" reduced to 5lb with RLE only pt unable to complete   Cybex Knee Flexion 20lb 2x10 RLE   Cybex Leg Press 40# 2x15; TKE RLE 20lb 2x10     Knee/Hip Exercises: Standing   Terminal Knee Extension Limitations 5"x15   Other Standing Knee Exercises Standing hip flex 3lb RLE 2x10      Knee/Hip Exercises: Seated   Long Arc Quad Right;10 reps;3 sets   Long Arc Quad Weight 3 lbs.                  PT Short Term Goals - 04/04/17 1031      PT SHORT TERM GOAL #1   Title independent with initial HEP   Time 2   Period Weeks   Status New           PT Long Term Goals - 04/12/17 1104      PT LONG TERM GOAL #1   Title understand and perform RICE   Status Achieved     PT LONG TERM GOAL #2   Title decrease pain 50%   Status  Partially Met               Plan - 05/16/17 0843    Clinical Impression Statement Pt continues to do well in therapy. She does demo some quad weakness with extension. Pt unable to complete LAQ on machine, moved to 3lb cuff weight to perform knee ext. She does reports some L knee pain with seated bilat ext on machine.    Rehab Potential Good   PT Frequency 3x / week   PT Duration 8 weeks   PT Treatment/Interventions ADLs/Self Care Home Management;Cryotherapy;Electrical Stimulation;Gait training;Stair training;Functional mobility training;Patient/family education;Balance training;Therapeutic exercise;Therapeutic activities;Vasopneumatic Device;Manual techniques   PT Next Visit Plan Gait RLE strenght      Patient will benefit from skilled therapeutic intervention in order to improve the following deficits and impairments:  Abnormal gait, Decreased activity tolerance, Decreased balance, Decreased mobility, Decreased strength, Impaired sensation, Decreased scar mobility, Pain, Decreased range of motion, Difficulty walking  Visit Diagnosis: Acute pain of right knee  Localized swelling, mass and lump, right lower limb  Difficulty in walking, not elsewhere classified  Stiffness of right knee, not elsewhere classified     Problem List Patient Active Problem List   Diagnosis Date Noted  . Colitis 04/20/2017  . Diverticulitis 04/18/2017  . Constipation due to opioid therapy 04/18/2017  . Migraine headache without aura 04/18/2017  . Generalized abdominal pain   . S/P right unicompartmental knee replacement 04/01/2017  . Obesity (BMI 30.0-34.9) 11/27/2012  . Hypothyroidism 09/13/2012  . Acute diverticulitis 09/10/2012  . Hypertension 09/10/2012  . Rosacea 09/10/2012  . Thyroid cancer, follicular variant of papillary thyroid carcinoma 06/09/2012  . Multinodular goiter (nontoxic) 04/17/2012  . GERD 05/26/2009    Scot Jun, PTA 05/16/2017, 8:48 AM  [place], [place], [address] Phone: [phone]   Fax:  [phone]  Name: [name] MRN: [id number] Date of Birth: [birth year]

## 2017-05-16 NOTE — Progress Notes (Signed)
CT now and prior reviewed as well as recent GI records. Seems like we can hold off on antibiotics unless the patient is experiencing increasing pain. This minimal pericolonic stranding may linger for weeks even though clinically resolved.

## 2017-05-20 ENCOUNTER — Encounter: Payer: Self-pay | Admitting: Physical Therapy

## 2017-05-20 ENCOUNTER — Ambulatory Visit: Payer: BLUE CROSS/BLUE SHIELD | Admitting: Physical Therapy

## 2017-05-20 DIAGNOSIS — R262 Difficulty in walking, not elsewhere classified: Secondary | ICD-10-CM

## 2017-05-20 DIAGNOSIS — M25561 Pain in right knee: Secondary | ICD-10-CM

## 2017-05-20 DIAGNOSIS — R2241 Localized swelling, mass and lump, right lower limb: Secondary | ICD-10-CM

## 2017-05-20 DIAGNOSIS — M25661 Stiffness of right knee, not elsewhere classified: Secondary | ICD-10-CM

## 2017-05-20 NOTE — Therapy (Signed)
[place], [place], [address] Phone: [phone]   Fax:  [phone]  Physical Therapy Treatment  Patient Details  Name: [name] MRN: [id number] Date of Birth: [birth year] Referring Provider: Alvan Dame  Encounter Date: 05/20/2017      PT End of Session - 05/20/17 1014    Visit Number 14   Date for PT Re-Evaluation 06/05/17   PT Start Time 0933   PT Stop Time 1014   PT Time Calculation (min) 41 min   Activity Tolerance Patient tolerated treatment well   Behavior During Therapy [facility] for tasks assessed/performed      Past Medical History:  Diagnosis Date  . Acne rosacea   . Acute diverticulitis 09/10/2012  . Allergic rhinitis   . Anemia   . Anxiety   . Colitis   . Diverticulosis   . Family history of adverse reaction to anesthesia    "sister's BP drops way low" (04/18/2017)  . GERD (gastroesophageal reflux disease)   . Goiter   . Hip bursitis   . Hypertension   . Hypothyroidism   . Migraines    "maybe once/year" (04/18/2017)  . Multiple thyroid nodules   . Obesity   . Osteoarthritis    "knees" (04/18/2017)  . Overactive bladder    carpal tunnel   . Shortness of breath    with exertion   . Squamous cell carcinoma 03/2017   RLE  . Tubular adenoma of colon 04/2011  . Wears glasses     Past Surgical History:  Procedure Laterality Date  . CAUTERY OF TURBINATES  1990's  . COLONOSCOPY    . EXCISIONAL HEMORRHOIDECTOMY    . GANGLION CYST EXCISION Right 06/25/2013   Procedure: RIGHT ANKLE GANGLION CYST EXCISION;  Surgeon: Wylene Simmer, MD;  Location: [place];  Service: Orthopedics;  Laterality: Right;  . INCISION AND DRAINAGE OF WOUND Right 07/23/2013   Procedure: IRRIGATION AND DEBRIDEMENT OF RIGHT ANKLE;  Surgeon: Wylene Simmer, MD;  Location: [place];  Service: Orthopedics;  Laterality: Right;  Esmark used for tourniquet--on at 1417, off at 1437.  Marland Kitchen KNEE  ARTHROSCOPY Right 11/2010  . NASAL SEPTUM SURGERY  1991  . PARTIAL KNEE ARTHROPLASTY Right 04/01/2017   Procedure: UNICOMPARTMENTAL RIGHT KNEE- Medially;  Surgeon: Paralee Cancel, MD;  [facility];  Service: Orthopedics;  Laterality: Right;  90 mins  . PARTIAL KNEE ARTHROPLASTY Left 04/2009  . PARTIAL KNEE ARTHROPLASTY Right 04/01/2017  . THYROIDECTOMY  05/08/2012   Procedure: THYROIDECTOMY;  Surgeon: Earnstine Regal, MD;  [facility];  Service: General;  Laterality: N/A;  Total Thyriodectomy  . TOTAL ABDOMINAL HYSTERECTOMY  2003   TOTAL ABDOMINAL HYSTERECTOMY W/ BILATERAL SALPINGOOPHORECTOMY  . TUBAL LIGATION    . WOUND DEBRIDEMENT Right 08/2013   ankle    There were no vitals filed for this visit.      Subjective Assessment - 05/20/17 0937    Subjective "It is going fine" Pt report  fatigue in her calves. Pt ambulates in clinic with a limp, repots that it loosens up as the day goes on.   Currently in Pain? No/denies   Pain Score 0-No pain                         [facility] Adult PT Treatment/Exercise - 05/20/17 0001      Ambulation/Gait   Stairs Yes   Stairs Assistance 6: Modified independent (  Device/Increase time)   Stair Management Technique One rail Right;Alternating pattern   Number of Stairs 24   Height of Stairs 6   Gait Comments 3 flights, reports L ateral R knee pain with desents      Knee/Hip Exercises: Aerobic   Elliptical 10  R5 36frd/2rev    Recumbent Bike L1 x 6 min     Knee/Hip Exercises: Machines for Strengthening   Cybex Leg Press RLE 30lb 2x10      Knee/Hip Exercises: Standing   Lateral Step Up Right;10 reps;Step Height: 6";2 sets;Hand Hold: 0  4lb dunbbell, explosive   Gait Training 30lb resisted gaig with 6in step up frowayd x10, Side step with step up x5 each     Knee/Hip Exercises: Seated   Long Arc Quad Right;10 reps;2 sets   [employer] Weight 3 lbs.   Sit to Sand 3 sets;5 reps;without UE support                   PT Short Term Goals - 04/04/17 1031      PT SHORT TERM GOAL #1   Title independent with initial HEP   Time 2   Period Weeks   Status New           PT Long Term Goals - 05/20/17 0944      PT LONG TERM GOAL #5   Title increase AROM of the right knee to 5-110 degrees flexion   Status Achieved               Plan - 05/20/17 1016    Clinical Impression Statement Pt progressing towards goals, she continues to report that she does not trust her leg at times because it does buckle. Pt with some instability and weakness with resisted gait.    Rehab Potential Good   PT Frequency 3x / week   PT Duration 8 weeks   PT Treatment/Interventions ADLs/Self Care Home Management;Cryotherapy;Electrical Stimulation;Gait training;Stair training;Functional mobility training;Patient/family education;Balance training;Therapeutic exercise;Therapeutic activities;Vasopneumatic Device;Manual techniques   PT Next Visit Plan Gait RLE strength      Patient will benefit from skilled therapeutic intervention in order to improve the following deficits and impairments:  Abnormal gait, Decreased activity tolerance, Decreased balance, Decreased mobility, Decreased strength, Impaired sensation, Decreased scar mobility, Pain, Decreased range of motion, Difficulty walking  Visit Diagnosis: Acute pain of right knee  Difficulty in walking, not elsewhere classified  Stiffness of right knee, not elsewhere classified  Localized swelling, mass and lump, right lower limb     Problem List Patient Active Problem List   Diagnosis Date Noted  . Colitis 04/20/2017  . Diverticulitis 04/18/2017  . Constipation due to opioid therapy 04/18/2017  . Migraine headache without aura 04/18/2017  . Generalized abdominal pain   . S/P right unicompartmental knee replacement 04/01/2017  . Obesity (BMI 30.0-34.9) 11/27/2012  . Hypothyroidism 09/13/2012  . Acute diverticulitis 09/10/2012  . Hypertension 09/10/2012  .  Rosacea 09/10/2012  . Thyroid cancer, follicular variant of papillary thyroid carcinoma 06/09/2012  . Multinodular goiter (nontoxic) 04/17/2012  . GERD 05/26/2009    Scot Jun, PTA 05/20/2017, 10:18 AM  [place], [place], [address] Phone: [phone]   Fax:  [phone]  Name: [name] MRN: [id number] Date of Birth: 1956-03-17

## 2017-05-23 ENCOUNTER — Encounter: Payer: Self-pay | Admitting: Physical Therapy

## 2017-05-23 ENCOUNTER — Ambulatory Visit: Payer: BLUE CROSS/BLUE SHIELD | Admitting: Physical Therapy

## 2017-05-23 DIAGNOSIS — M25561 Pain in right knee: Secondary | ICD-10-CM

## 2017-05-23 DIAGNOSIS — R262 Difficulty in walking, not elsewhere classified: Secondary | ICD-10-CM

## 2017-05-23 DIAGNOSIS — R2241 Localized swelling, mass and lump, right lower limb: Secondary | ICD-10-CM

## 2017-05-23 DIAGNOSIS — M25661 Stiffness of right knee, not elsewhere classified: Secondary | ICD-10-CM

## 2017-05-23 NOTE — Therapy (Signed)
[address] [place], [place], [address] Phone: [phone]   Fax:  [phone]  Physical Therapy Treatment  Patient Details  Name: [name] MRN: [id number] Date of Birth: 1956-01-22 Referring Provider: Alvan Dame  Encounter Date: 05/23/2017      PT End of Session - 05/23/17 1056    Visit Number 15   Date for PT Re-Evaluation 06/05/17   PT Start Time 1020   PT Stop Time 1100   PT Time Calculation (min) 40 min      Past Medical History:  Diagnosis Date  . Acne rosacea   . Acute diverticulitis 09/10/2012  . Allergic rhinitis   . Anemia   . Anxiety   . Colitis   . Diverticulosis   . Family history of adverse reaction to anesthesia    "sister's BP drops way low" (04/18/2017)  . GERD (gastroesophageal reflux disease)   . Goiter   . Hip bursitis   . Hypertension   . Hypothyroidism   . Migraines    "maybe once/year" (04/18/2017)  . Multiple thyroid nodules   . Obesity   . Osteoarthritis    "knees" (04/18/2017)  . Overactive bladder    carpal tunnel   . Shortness of breath    with exertion   . Squamous cell carcinoma 03/2017   RLE  . Tubular adenoma of colon 04/2011  . Wears glasses     Past Surgical History:  Procedure Laterality Date  . CAUTERY OF TURBINATES  1990's  . COLONOSCOPY    . EXCISIONAL HEMORRHOIDECTOMY    . GANGLION CYST EXCISION Right 06/25/2013   Procedure: RIGHT ANKLE GANGLION CYST EXCISION;  Surgeon: Wylene Simmer, MD;  Location: [facility];  Service: Orthopedics;  Laterality: Right;  . INCISION AND DRAINAGE OF WOUND Right 07/23/2013   Procedure: IRRIGATION AND DEBRIDEMENT OF RIGHT ANKLE;  Surgeon: Wylene Simmer, MD;  Location: [facility];  Service: Orthopedics;  Laterality: Right;  Esmark used for tourniquet--on at 1417, off at 1437.  Marland Kitchen KNEE ARTHROSCOPY Right 11/2010  . NASAL SEPTUM SURGERY  1991  . PARTIAL KNEE ARTHROPLASTY Right 04/01/2017   Procedure: UNICOMPARTMENTAL RIGHT KNEE- Medially;  Surgeon: Paralee Cancel, MD;  Location: [facility];  Service: Orthopedics;  Laterality: Right;  90 mins  . PARTIAL KNEE ARTHROPLASTY Left 04/2009  . PARTIAL KNEE ARTHROPLASTY Right 04/01/2017  . THYROIDECTOMY  05/08/2012   Procedure: THYROIDECTOMY;  Surgeon: Earnstine Regal, MD;  Location: [facility];  Service: General;  Laterality: N/A;  Total Thyriodectomy  . TOTAL ABDOMINAL HYSTERECTOMY  2003   TOTAL ABDOMINAL HYSTERECTOMY W/ BILATERAL SALPINGOOPHORECTOMY  . TUBAL LIGATION    . WOUND DEBRIDEMENT Right 08/2013   ankle    There were no vitals filed for this visit.      Subjective Assessment - 05/23/17 1022    Subjective Pt repots that she is doing fine, repots wearing flats Tuesday and almost falling three times.    Currently in Pain? No/denies   Pain Score 0-No pain                         [facility] Adult PT Treatment/Exercise - 05/23/17 0001      Ambulation/Gait   Ambulation/Gait Yes   Ambulation/Gait Assistance 6: Modified independent (Device/Increase time)   Ambulation Distance (Feet) --  >600   Assistive device None   Gait Pattern Within Functional Limits;Step-through pattern   Ambulation Surface Level;Unlevel;Indoor;Outdoor;Paved  Stairs Yes   Stairs Assistance 6: Modified independent (Device/Increase time)   Stair Management Technique One rail Right;Alternating pattern   Number of Stairs 24   Height of Stairs 6   Gait Comments mild limp with up himm abulation as pt fatigues.     Knee/Hip Exercises: Aerobic   Elliptical 10  R5 50fd/2rev    Recumbent Bike L2 x 6 min     Knee/Hip Exercises: Machines for Strengthening   Cybex Knee Extension RLE 5lb 2x10   Cybex Knee Flexion 25lb 3x10 RLE   Cybex Leg Press 40lb 2x15; Heel raises 40lb 2x15     Knee/Hip Exercises: Standing   Lateral Step Up Right;10 reps;2 sets;Hand Hold: 0  4lb dumbbell     Knee/Hip Exercises: Seated   Sit to Sand 3 sets;5 reps;without UE  support                  PT Short Term Goals - 04/04/17 1031      PT SHORT TERM GOAL #1   Title independent with initial HEP   Time 2   Period Weeks   Status New           PT Long Term Goals - 05/23/17 1059      PT LONG TERM GOAL #2   Title decrease pain 50%   Status Partially Met     PT LONG TERM GOAL #3   Title walk without device 500 feet with minimal deviation   Status Partially Met     PT LONG TERM GOAL #4   Title go up and down stairs step over step    Status Partially Met               Plan - 05/23/17 1057    Clinical Impression Statement Pt ~ 5 minute late for today's PT session. Progressed to outdoor ambulation over uneven terrain. Pt with a mild limp with uphill amb and she reports fatigue. Pt with little compensation with sit to stand.   Rehab Potential Good   PT Frequency 3x / week   PT Duration 8 weeks   PT Treatment/Interventions ADLs/Self Care Home Management;Cryotherapy;Electrical Stimulation;Gait training;Stair training;Functional mobility training;Patient/family education;Balance training;Therapeutic exercise;Therapeutic activities;Vasopneumatic Device;Manual techniques   PT Next Visit Plan Gait RLE strenght      Patient will benefit from skilled therapeutic intervention in order to improve the following deficits and impairments:  Abnormal gait, Decreased activity tolerance, Decreased balance, Decreased mobility, Decreased strength, Impaired sensation, Decreased scar mobility, Pain, Decreased range of motion, Difficulty walking  Visit Diagnosis: Acute pain of right knee  Difficulty in walking, not elsewhere classified  Stiffness of right knee, not elsewhere classified  Localized swelling, mass and lump, right lower limb     Problem List Patient Active Problem List   Diagnosis Date Noted  . Colitis 04/20/2017  . Diverticulitis 04/18/2017  . Constipation due to opioid therapy 04/18/2017  . Migraine headache without aura  04/18/2017  . Generalized abdominal pain   . S/P right unicompartmental knee replacement 04/01/2017  . Obesity (BMI 30.0-34.9) 11/27/2012  . Hypothyroidism 09/13/2012  . Acute diverticulitis 09/10/2012  . Hypertension 09/10/2012  . Rosacea 09/10/2012  . Thyroid cancer, follicular variant of papillary thyroid carcinoma 06/09/2012  . Multinodular goiter (nontoxic) 04/17/2012  . GERD 05/26/2009    RScot Jun PTA 05/23/2017, 11:00 AM  [place] [address]Phone: [phone]  Fax:  [phone]  Name: [name] MRN: [id number] Date of Birth: 1956-06-13

## 2017-05-27 ENCOUNTER — Encounter: Payer: Self-pay | Admitting: Physician Assistant

## 2017-05-27 ENCOUNTER — Encounter: Payer: Self-pay | Admitting: Physical Therapy

## 2017-05-27 ENCOUNTER — Ambulatory Visit (INDEPENDENT_AMBULATORY_CARE_PROVIDER_SITE_OTHER): Payer: BLUE CROSS/BLUE SHIELD | Admitting: Physician Assistant

## 2017-05-27 ENCOUNTER — Ambulatory Visit: Payer: BLUE CROSS/BLUE SHIELD | Admitting: Physical Therapy

## 2017-05-27 VITALS — BP 110/80 | HR 80 | Ht 66.5 in | Wt 188.0 lb

## 2017-05-27 DIAGNOSIS — R2241 Localized swelling, mass and lump, right lower limb: Secondary | ICD-10-CM

## 2017-05-27 DIAGNOSIS — M25561 Pain in right knee: Secondary | ICD-10-CM

## 2017-05-27 DIAGNOSIS — Z8719 Personal history of other diseases of the digestive system: Secondary | ICD-10-CM | POA: Diagnosis not present

## 2017-05-27 DIAGNOSIS — K580 Irritable bowel syndrome with diarrhea: Secondary | ICD-10-CM

## 2017-05-27 DIAGNOSIS — M25661 Stiffness of right knee, not elsewhere classified: Secondary | ICD-10-CM

## 2017-05-27 DIAGNOSIS — R1032 Left lower quadrant pain: Secondary | ICD-10-CM

## 2017-05-27 DIAGNOSIS — R262 Difficulty in walking, not elsewhere classified: Secondary | ICD-10-CM

## 2017-05-27 NOTE — Therapy (Signed)
[address] [place], [place], [address] Phone: [phone]   Fax:  [phone]  Physical Therapy Treatment  Patient Details  Name: [name] MRN: [id number] Date of Birth: October 16, 1955 Referring Provider: Alvan Dame  Encounter Date: 05/27/2017      PT End of Session - 05/27/17 1003    Visit Number 16   Date for PT Re-Evaluation 06/05/17   PT Start Time 0928   PT Stop Time 1009   PT Time Calculation (min) 41 min   Activity Tolerance Patient tolerated treatment well   Behavior During Therapy [facility] for tasks assessed/performed      Past Medical History:  Diagnosis Date  . Acne rosacea   . Acute diverticulitis 09/10/2012  . Allergic rhinitis   . Anemia   . Anxiety   . Colitis   . Diverticulosis   . Family history of adverse reaction to anesthesia    "sister's BP drops way low" (04/18/2017)  . GERD (gastroesophageal reflux disease)   . Goiter   . Hip bursitis   . Hypertension   . Hypothyroidism   . Migraines    "maybe once/year" (04/18/2017)  . Multiple thyroid nodules   . Obesity   . Osteoarthritis    "knees" (04/18/2017)  . Overactive bladder    carpal tunnel   . Shortness of breath    with exertion   . Squamous cell carcinoma 03/2017   RLE  . Tubular adenoma of colon 04/2011  . Wears glasses     Past Surgical History:  Procedure Laterality Date  . CAUTERY OF TURBINATES  1990's  . COLONOSCOPY    . EXCISIONAL HEMORRHOIDECTOMY    . GANGLION CYST EXCISION Right 06/25/2013   Procedure: RIGHT ANKLE GANGLION CYST EXCISION;  Surgeon: Wylene Simmer, MD;  Location: [facility];  Service: Orthopedics;  Laterality: Right;  . INCISION AND DRAINAGE OF WOUND Right 07/23/2013   Procedure: IRRIGATION AND DEBRIDEMENT OF RIGHT ANKLE;  Surgeon: Wylene Simmer, MD;  Location: [facility];  Service: Orthopedics;  Laterality: Right;  Esmark used for tourniquet--on at 1417, off at 1437.  Marland Kitchen KNEE  ARTHROSCOPY Right 11/2010  . NASAL SEPTUM SURGERY  1991  . PARTIAL KNEE ARTHROPLASTY Right 04/01/2017   Procedure: UNICOMPARTMENTAL RIGHT KNEE- Medially;  Surgeon: Paralee Cancel, MD;  Location: [facility];  Service: Orthopedics;  Laterality: Right;  90 mins  . PARTIAL KNEE ARTHROPLASTY Left 04/2009  . PARTIAL KNEE ARTHROPLASTY Right 04/01/2017  . THYROIDECTOMY  05/08/2012   Procedure: THYROIDECTOMY;  Surgeon: Earnstine Regal, MD;  Location: [facility];  Service: General;  Laterality: N/A;  Total Thyriodectomy  . TOTAL ABDOMINAL HYSTERECTOMY  2003   TOTAL ABDOMINAL HYSTERECTOMY W/ BILATERAL SALPINGOOPHORECTOMY  . TUBAL LIGATION    . WOUND DEBRIDEMENT Right 08/2013   ankle    There were no vitals filed for this visit.      Subjective Assessment - 05/27/17 0939    Subjective Pt repots that she has been having pain since Friday due to the weather    Currently in Pain? Yes   Pain Score 2    Pain Location Knee   Pain Orientation Right                         [facility] Adult PT Treatment/Exercise - 05/27/17 0001      Knee/Hip Exercises: Aerobic   Elliptical 10  R5 52fd/2rev    Recumbent Bike  L2 x 4 min     Knee/Hip Exercises: Machines for Strengthening   Cybex Knee Extension RLE 5lb 2x10   Cybex Knee Flexion 25lb 3x10 RLE   Cybex Leg Press 50lb 2x15; Heel raises 40lb 2x15     Knee/Hip Exercises: Standing   Hip Abduction Both;2 sets;10 reps   Hip Extension 2 sets;Both;10 reps   Forward Step Up Right;2 sets;10 reps;Step Height: 6";Hand Hold: 2     Knee/Hip Exercises: Seated   Sit to Sand 2 sets;10 reps;without UE support  to pillow on mat table                  PT Short Term Goals - 04/04/17 1031      PT SHORT TERM GOAL #1   Title independent with initial HEP   Time 2   Period Weeks   Status New           PT Long Term Goals - 05/23/17 1059      PT LONG TERM GOAL #2   Title decrease pain 50%   Status Partially Met     PT LONG TERM GOAL #3   Title  walk without device 500 feet with minimal deviation   Status Partially Met     PT LONG TERM GOAL #4   Title go up and down stairs step over step    Status Partially Met               Plan - 05/27/17 1003    Clinical Impression Statement All interventions completed well, pt repots weakness with standing hip exercises when standing on surgical LE, visible shaking noted as well. Decrease R quad strengthen with seated knee extensions.   Rehab Potential Good   PT Frequency 3x / week   PT Duration 8 weeks   PT Treatment/Interventions ADLs/Self Care Home Management;Cryotherapy;Electrical Stimulation;Gait training;Stair training;Functional mobility training;Patient/family education;Balance training;Therapeutic exercise;Therapeutic activities;Vasopneumatic Device;Manual techniques   PT Next Visit Plan Gait RLE strenght      Patient will benefit from skilled therapeutic intervention in order to improve the following deficits and impairments:  Abnormal gait, Decreased activity tolerance, Decreased balance, Decreased mobility, Decreased strength, Impaired sensation, Decreased scar mobility, Pain, Decreased range of motion, Difficulty walking  Visit Diagnosis: Acute pain of right knee  Localized swelling, mass and lump, right lower limb  Stiffness of right knee, not elsewhere classified  Difficulty in walking, not elsewhere classified     Problem List Patient Active Problem List   Diagnosis Date Noted  . Colitis 04/20/2017  . Diverticulitis 04/18/2017  . Constipation due to opioid therapy 04/18/2017  . Migraine headache without aura 04/18/2017  . Generalized abdominal pain   . S/P right unicompartmental knee replacement 04/01/2017  . Obesity (BMI 30.0-34.9) 11/27/2012  . Hypothyroidism 09/13/2012  . Acute diverticulitis 09/10/2012  . Hypertension 09/10/2012  . Rosacea 09/10/2012  . Thyroid cancer, follicular variant of papillary thyroid carcinoma 06/09/2012  . Multinodular  goiter (nontoxic) 04/17/2012  . GERD 05/26/2009    Scot Jun, PTA 05/27/2017, 10:05 AM  [address] [place], [place], [address] Phone: [phone]   Fax:  [phone]  Name: [name] MRN: [id number] Date of Birth: 1956/05/12

## 2017-05-27 NOTE — Progress Notes (Signed)
Reviewed and agree with initial management plan.  Terasa Orsini T. Chessie Neuharth, MD FACG 

## 2017-05-27 NOTE — Progress Notes (Signed)
Chief Complaint: Change in bowel habits  HPI:  [name] is a 61 year old Caucasian female with a past medical history as listed below, who returns clinic today for follow-up of her change of bowel habits.   Please recall patient was last seen in clinic 05/09/17. Prior to that patient was admitted to the hospital 04/18/17-04/22/17 for diverticulitis. At time of last visit she had finished her antibiotics a few days before and had continued with bilateral lower abdominal discomfort which seemed constant. She described this pain as a decrease from previous and then only a 1.5-2/10 and remained with a soft stool on MiraLAX twice a day and a stool softener at night. She also complained of occasional nausea. She had tried to increase her diet from soft but noticed an increase in discomfort and had gone back. She also described one episode of bright red blood. At that time a repeat CT abdomen pelvis was ordered. She was also prescribed hyoscyamine sulfate 0.125 mg, told to continue her probiotic or try VSL and stop her stool softener but continue MiraLAX. It was thought likely her symptoms were an overlap of her IBS.   Repeat CT abdomen and pelvis showed mild inflammatory changes noted at the site of recently diagnosed sigmoid colon diverticulitis. No exacerbation of the diverticula is seen. No abscess. The appendix and terminal ileum were unremarkable. The patient was called and discussed that she was getting better slowly and would increase her diet as tolerated.    Today, the patient tells me that her symptoms are much improved. She has very mild left lower quadrant discomfort when "I touch that area", but other than this her other symptoms are gone. She actually just had "a flare of my normal IBS" earlier this week after she ate "the wrong food". Patient again tells me flares consists of diarrhea and typically only occur once a month. The patient has been using her hyoscyamine occasionally for abdominal  cramping which work "to take the edge off". Overall patient is much improved and has continued her regular probiotic Restora, but would like to discuss other possibilities.   Patient denies fever, chills, weight loss, nausea or vomiting.  Past Medical History:  Diagnosis Date  . Acne rosacea   . Acute diverticulitis 09/10/2012  . Allergic rhinitis   . Anemia   . Anxiety   . Colitis   . Diverticulosis   . Family history of adverse reaction to anesthesia    "sister's BP drops way low" (04/18/2017)  . GERD (gastroesophageal reflux disease)   . Goiter   . Hip bursitis   . Hypertension   . Hypothyroidism   . Migraines    "maybe once/year" (04/18/2017)  . Multiple thyroid nodules   . Obesity   . Osteoarthritis    "knees" (04/18/2017)  . Overactive bladder    carpal tunnel   . Shortness of breath    with exertion   . Squamous cell carcinoma 03/2017   RLE  . Tubular adenoma of colon 04/2011  . Wears glasses     Past Surgical History:  Procedure Laterality Date  . CAUTERY OF TURBINATES  1990's  . COLONOSCOPY    . EXCISIONAL HEMORRHOIDECTOMY    . GANGLION CYST EXCISION Right 06/25/2013   Procedure: RIGHT ANKLE GANGLION CYST EXCISION;  Surgeon: Wylene Simmer, MD;  Location: [place];  Service: Orthopedics;  Laterality: Right;  . INCISION AND DRAINAGE OF WOUND Right 07/23/2013   Procedure: IRRIGATION AND DEBRIDEMENT OF RIGHT ANKLE;  Surgeon:  Wylene Simmer, MD;  Location: [place];  Service: Orthopedics;  Laterality: Right;  Esmark used for tourniquet--on at 1417, off at 1437.  Marland Kitchen KNEE ARTHROSCOPY Right 11/2010  . NASAL SEPTUM SURGERY  1991  . PARTIAL KNEE ARTHROPLASTY Right 04/01/2017   Procedure: UNICOMPARTMENTAL RIGHT KNEE- Medially;  Surgeon: Paralee Cancel, MD;  [facility];  Service: Orthopedics;  Laterality: Right;  90 mins  . PARTIAL KNEE ARTHROPLASTY Left 04/2009  . PARTIAL KNEE ARTHROPLASTY Right 04/01/2017  . THYROIDECTOMY  05/08/2012    Procedure: THYROIDECTOMY;  Surgeon: Earnstine Regal, MD;  [facility];  Service: General;  Laterality: N/A;  Total Thyriodectomy  . TOTAL ABDOMINAL HYSTERECTOMY  2003   TOTAL ABDOMINAL HYSTERECTOMY W/ BILATERAL SALPINGOOPHORECTOMY  . TUBAL LIGATION    . WOUND DEBRIDEMENT Right 08/2013   ankle    Current Outpatient Prescriptions  Medication Sig Dispense Refill  . Calcium Carbonate-Vit D-Min (CALCIUM 1200 PO) Take 1,200 Units by mouth daily.    . fluticasone (FLONASE) 50 MCG/ACT nasal spray Place 2 sprays into the nose daily as needed for allergies.     . hyoscyamine (LEVSIN SL) 0.125 MG SL tablet Take 1 tablet (0.125 mg total) by mouth every 6 (six) hours as needed. cramping 90 tablet 0  . irbesartan (AVAPRO) 300 MG tablet Take 1 tablet (300 mg total) by mouth daily. 30 tablet 3  . lansoprazole (PREVACID) 15 MG capsule Take 15 mg by mouth daily.    Marland Kitchen levothyroxine (SYNTHROID, LEVOTHROID) 175 MCG tablet Take 175 mcg by mouth at bedtime.    Marland Kitchen LORazepam (ATIVAN) 0.5 MG tablet Take 0.5 mg by mouth 2 (two) times daily as needed for anxiety.     . metroNIDAZOLE (METROCREAM) 0.75 % cream Apply 1 application topically 2 (two) times daily.     . ondansetron (ZOFRAN ODT) 4 MG disintegrating tablet Take 1 tablet (4 mg total) by mouth every 8 (eight) hours as needed for nausea or vomiting. 20 tablet 0  . polyethylene glycol (MIRALAX / GLYCOLAX) packet Take 17 g by mouth daily as needed for moderate constipation. (Patient taking differently: Take 17 g by mouth 2 (two) times daily. ) 30 each 2  . Probiotic Product (RESTORA) CAPS TAKE ONE CAPSULE BY MOUTH EVERY DAY (NEED APPT FOR FURTHER REFILLS) (Patient taking differently: Take 1 capsule by mouth daily. TAKE ONE CAPSULE BY MOUTH EVERY DAY (NEED APPT FOR FURTHER REFILLS)) 30 capsule 0  . senna-docusate (SENOKOT S) 8.6-50 MG tablet Take 1 tablet by mouth at bedtime. For constipation 30 tablet 1  . UNABLE TO FIND OUTPATIENT PHYSICAL THERAPY   Diagnosis:  partial TKA 04/01/17 on RIGHT, ac diverticulitis 1 Mutually Defined 0  . valACYclovir (VALTREX) 500 MG tablet Take 1,000 mg by mouth 2 (two) times daily as needed (fever blisters).    . zolpidem (AMBIEN) 5 MG tablet Take 2.5 mg by mouth at bedtime as needed for sleep.     No current facility-administered medications for this visit.     Allergies as of 05/27/2017 - Review Complete 05/27/2017  Allergen Reaction Noted  . Cephalexin Hives   . Macrobid [nitrofurantoin macrocrystal] Nausea And Vomiting and Other (See Comments) 03/21/2017  . Zithromax [azithromycin] Other (See Comments) 04/25/2012  . Tramadol Nausea Only 05/09/2017    Family History  Problem Relation Age of Onset  . Colon cancer Maternal Grandfather   . Diabetes Mother   . Heart disease Mother   . Breast cancer Mother   . Pulmonary embolism Father   .  Hypertension Sister     Social History   Social History  . Marital status: Married    Spouse name: N/A  . Number of children: 3  . Years of education: N/A   Occupational History  . Speech pathology    Social History Main Topics  . Smoking status: Never Smoker  . Smokeless tobacco: Never Used  . Alcohol use No  . Drug use: No  . Sexual activity: Yes   Other Topics Concern  . Not on file   Social History Narrative  . No narrative on file    Review of Systems:    Constitutional: No weight loss, fever or chills Skin: No rash Cardiovascular: No chest pain Respiratory: No SOB  Gastrointestinal: See HPI and otherwise negative   Physical Exam:  Vital signs: BP 110/80   Pulse 80   Ht 5' 6.5" (1.689 m)   Wt 188 lb (85.3 kg)   BMI 29.89 kg/m  \  Constitutional:   Pleasant Caucasian female appears to be in NAD, Well developed, Well nourished, alert and cooperative Respiratory: Respirations even and unlabored. Lungs clear to auscultation bilaterally.   No wheezes, crackles, or rhonchi.  Cardiovascular: Normal S1, S2. No MRG. Regular rate and rhythm. No  peripheral edema, cyanosis or pallor.  Gastrointestinal:  Soft, nondistended, mild LLQ ttp. No rebound or guarding. Normal bowel sounds. No appreciable masses or hepatomegaly. Psychiatric: Demonstrates good judgement and reason without abnormal affect or behaviors.  MOST RECENT LABS AND IMAGING: CBC    Component Value Date/Time   WBC 6.6 04/22/2017 0440   RBC 4.05 04/22/2017 0440   HGB 12.1 04/22/2017 0440   HCT 37.2 04/22/2017 0440   PLT 381 04/22/2017 0440   MCV 91.9 04/22/2017 0440   MCH 29.9 04/22/2017 0440   MCHC 32.5 04/22/2017 0440   RDW 14.1 04/22/2017 0440   LYMPHSABS 0.9 09/10/2012 0755   MONOABS 0.9 09/10/2012 0755   EOSABS 0.0 09/10/2012 0755   BASOSABS 0.0 09/10/2012 0755    CMP     Component Value Date/Time   NA 140 04/23/2017 0453   K 3.7 04/23/2017 0453   CL 107 04/23/2017 0453   CO2 26 04/23/2017 0453   GLUCOSE 104 (H) 04/23/2017 0453   BUN 8 04/23/2017 0453   CREATININE 0.83 04/23/2017 0453   CALCIUM 8.6 (L) 04/23/2017 0453   PROT 7.3 09/10/2012 0755   ALBUMIN 4.0 09/10/2012 0755   AST 49 (H) 09/10/2012 0755   ALT 49 (H) 09/10/2012 0755   ALKPHOS 130 (H) 09/10/2012 0755   BILITOT 0.4 09/10/2012 0755   GFRNONAA >60 04/23/2017 0453   GFRAA >60 04/23/2017 0453   CT ABDOMEN AND PELVIS WITH CONTRAST 05/16/17  TECHNIQUE: Multidetector CT imaging of the abdomen and pelvis was performed using the standard protocol following bolus administration of intravenous contrast.  CONTRAST:  139mL ISOVUE-300 IOPAMIDOL (ISOVUE-300) INJECTION 61%  COMPARISON:  CT abdomen pelvis of 04/18/2017  FINDINGS: Lower chest: The lung bases are clear. The heart is within normal limits in size.  Hepatobiliary: The liver enhances and no focal hepatic abnormality is seen. No calcified gallstones are noted. A small cyst is noted within the liver adjacent to the gallbladder.  Pancreas: The pancreas is normal in size and the pancreatic duct is not dilated.  Spleen:  The spleen is unremarkable.  Adrenals/Urinary Tract: The adrenal glands appear normal. The kidneys enhance with no calculus or mass. No hydronephrosis is seen. The ureters are normal in caliber. The urinary bladder is not  well distended but is unremarkable.  Stomach/Bowel: The stomach is moderately distended with oral contrast. No small bowel distention is seen. Rectosigmoid colon diverticular are present. Minimal strandiness remains in the pericolonic fat planes at the site of diverticulitis noted recently but no exacerbation of that diverticulitis is seen. No abscess is noted. There is feces throughout the more proximal colon. The appendix and the terminal ileum are unremarkable.  Vascular/Lymphatic: The abdominal aorta is normal in caliber. No adenopathy is seen.  Reproductive: The uterus has previously been resected. No adnexal lesion is seen. No free fluid is noted within the pelvis.  Other: None  Musculoskeletal: The lumbar vertebrae are in normal alignment. There is degenerative disc disease at L5-S1.  IMPRESSION: 1. Mild inflammatory change is noted at the site of the recently diagnosed sigmoid colon diverticulitis. No exacerbation of the diverticulitis is seen. No abscess is noted. 2. The appendix and terminal ileum are unremarkable.   Electronically Signed   By: Ivar Drape M.D.   On: 05/16/2017 16:02  Assessment: 1. Left lower quadrant abdominal pain: Much improved, only minimal at time of exam today, patient does not feel this on a daily basis; likely residual from recent diverticulitis in combination with known IBS 2. IBS-D: Known history for the patient, does continue with a recent flare 3. History of recent diverticulitis  Plan: 1. Recommend the patient continue hyoscyamine as needed for abdominal cramping 2. Discussed VSL #3 as a possibility if the patient would like to try something other than Restora, would recommend she use 1/2-1 packet per  day 3. Patient asked questions regarding bone broth, ginger and tumeric, recommend she does not use turmeric or ginger as this could flare her gastritis and reflux symptoms. She can try bone broth if she wishes. 4. Patient to follow up as needed with Dr. Fuller Plan or myself  Ellouise Newer, PA-C [facility] 05/27/2017, 1:24 PM  Cc: Harlan Stains, MD

## 2017-05-30 ENCOUNTER — Ambulatory Visit: Payer: BLUE CROSS/BLUE SHIELD | Admitting: Physical Therapy

## 2017-05-30 ENCOUNTER — Encounter: Payer: Self-pay | Admitting: Physical Therapy

## 2017-05-30 DIAGNOSIS — M25561 Pain in right knee: Secondary | ICD-10-CM

## 2017-05-30 DIAGNOSIS — M25661 Stiffness of right knee, not elsewhere classified: Secondary | ICD-10-CM

## 2017-05-30 DIAGNOSIS — R2241 Localized swelling, mass and lump, right lower limb: Secondary | ICD-10-CM

## 2017-05-30 DIAGNOSIS — R262 Difficulty in walking, not elsewhere classified: Secondary | ICD-10-CM

## 2017-05-30 NOTE — Therapy (Signed)
[place], [place], [address] Phone: [phone]   Fax:  [phone]  Physical Therapy Treatment  Patient Details  Name: [name] MRN: [id number] Date of Birth: [birth year] Referring Provider: Alvan Dame  Encounter Date: 05/30/2017      PT End of Session - 05/30/17 1645    Visit Number 17   Date for PT Re-Evaluation 06/05/17   PT Start Time 1600   PT Stop Time 1645   PT Time Calculation (min) 45 min   Activity Tolerance Patient tolerated treatment well   Behavior During Therapy [facility] for tasks assessed/performed      Past Medical History:  Diagnosis Date  . Acne rosacea   . Acute diverticulitis 09/10/2012  . Allergic rhinitis   . Anemia   . Anxiety   . Colitis   . Diverticulosis   . Family history of adverse reaction to anesthesia    "sister's BP drops way low" (04/18/2017)  . GERD (gastroesophageal reflux disease)   . Goiter   . Hip bursitis   . Hypertension   . Hypothyroidism   . Migraines    "maybe once/year" (04/18/2017)  . Multiple thyroid nodules   . Obesity   . Osteoarthritis    "knees" (04/18/2017)  . Overactive bladder    carpal tunnel   . Shortness of breath    with exertion   . Squamous cell carcinoma 03/2017   RLE  . Tubular adenoma of colon 04/2011  . Wears glasses     Past Surgical History:  Procedure Laterality Date  . CAUTERY OF TURBINATES  1990's  . COLONOSCOPY    . EXCISIONAL HEMORRHOIDECTOMY    . GANGLION CYST EXCISION Right 06/25/2013   Procedure: RIGHT ANKLE GANGLION CYST EXCISION;  Surgeon: Wylene Simmer, MD;  Location: [place];  Service: Orthopedics;  Laterality: Right;  . INCISION AND DRAINAGE OF WOUND Right 07/23/2013   Procedure: IRRIGATION AND DEBRIDEMENT OF RIGHT ANKLE;  Surgeon: Wylene Simmer, MD;  Location: [place];  Service: Orthopedics;  Laterality: Right;  Esmark used for tourniquet--on at 1417, off at 1437.  Marland Kitchen KNEE  ARTHROSCOPY Right 11/2010  . NASAL SEPTUM SURGERY  1991  . PARTIAL KNEE ARTHROPLASTY Right 04/01/2017   Procedure: UNICOMPARTMENTAL RIGHT KNEE- Medially;  Surgeon: Paralee Cancel, MD;  [facility];  Service: Orthopedics;  Laterality: Right;  90 mins  . PARTIAL KNEE ARTHROPLASTY Left 04/2009  . PARTIAL KNEE ARTHROPLASTY Right 04/01/2017  . THYROIDECTOMY  05/08/2012   Procedure: THYROIDECTOMY;  Surgeon: Earnstine Regal, MD;  [facility];  Service: General;  Laterality: N/A;  Total Thyriodectomy  . TOTAL ABDOMINAL HYSTERECTOMY  2003   TOTAL ABDOMINAL HYSTERECTOMY W/ BILATERAL SALPINGOOPHORECTOMY  . TUBAL LIGATION    . WOUND DEBRIDEMENT Right 08/2013   ankle    There were no vitals filed for this visit.      Subjective Assessment - 05/30/17 1607    Subjective "Im good"   Currently in Pain? No/denies   Pain Score 0-No pain            [facility] PT Assessment - 05/30/17 0001      AROM   AROM Assessment Site Knee   Right/Left Knee Right   Right Knee Extension 3   Right Knee Flexion 113                     [facility] Adult PT Treatment/Exercise - 05/30/17 0001  Ambulation/Gait   Stairs Yes   Stairs Assistance 6: Modified independent (Device/Increase time)   Stair Management Technique No rails;One rail Right   Number of Stairs 24   Height of Stairs 6   Gait Comments Fatigues quick     Knee/Hip Exercises: Aerobic   Elliptical 10  R5 23fd/4rev      Knee/Hip Exercises: Machines for Strengthening   Cybex Knee Extension RLE 5lb 2x15   Cybex Knee Flexion 25lb 3x10 RLE   Cybex Leg Press 50lb 2x15; Heel raises 40lb 2x15     Knee/Hip Exercises: Standing   Heel Raises Both;2 sets;15 reps;2 seconds   Lateral Step Up Right;10 reps;2 sets;Hand Hold: 0   Forward Step Up Right;2 sets;10 reps;Step Height: 8"   Walking with Sports Cord resisted side steps 40lb 2x10      Knee/Hip Exercises: Seated   Sit to Sand 2 sets;10 reps;without UE support  yellow ball from blue  chair                   PT Short Term Goals - 04/04/17 1031      PT SHORT TERM GOAL #1   Title independent with initial HEP   Time 2   Period Weeks   Status New           PT Long Term Goals - 05/30/17 1629      PT LONG TERM GOAL #4   Title go up and down stairs step over step    Status Partially Met               Plan - 05/30/17 1646    Clinical Impression Statement Progressing towards all goals, no issues with today's interventions.   Rehab Potential Good   PT Frequency 3x / week   PT Duration 8 weeks   PT Treatment/Interventions ADLs/Self Care Home Management;Cryotherapy;Electrical Stimulation;Gait training;Stair training;Functional mobility training;Patient/family education;Balance training;Therapeutic exercise;Therapeutic activities;Vasopneumatic Device;Manual techniques   PT Next Visit Plan Gait RLE strenght      Patient will benefit from skilled therapeutic intervention in order to improve the following deficits and impairments:  Abnormal gait, Decreased activity tolerance, Decreased balance, Decreased mobility, Decreased strength, Impaired sensation, Decreased scar mobility, Pain, Decreased range of motion, Difficulty walking  Visit Diagnosis: Acute pain of right knee  Localized swelling, mass and lump, right lower limb  Difficulty in walking, not elsewhere classified  Stiffness of right knee, not elsewhere classified     Problem List Patient Active Problem List   Diagnosis Date Noted  . Colitis 04/20/2017  . Diverticulitis 04/18/2017  . Constipation due to opioid therapy 04/18/2017  . Migraine headache without aura 04/18/2017  . Generalized abdominal pain   . S/P right unicompartmental knee replacement 04/01/2017  . Obesity (BMI 30.0-34.9) 11/27/2012  . Hypothyroidism 09/13/2012  . Acute diverticulitis 09/10/2012  . Hypertension 09/10/2012  . Rosacea 09/10/2012  . Thyroid cancer, follicular variant of papillary thyroid carcinoma  06/09/2012  . Multinodular goiter (nontoxic) 04/17/2012  . GERD 05/26/2009    RScot Jun PTA 05/30/2017, 4:50 PM  [place] [address]Phone: [phone]  Fax:  [phone] Name: Robin SYBERTMRN: [id number] of Birth: [birth year]

## 2017-06-03 ENCOUNTER — Ambulatory Visit: Payer: BLUE CROSS/BLUE SHIELD | Admitting: Physical Therapy

## 2017-06-03 ENCOUNTER — Encounter: Payer: Self-pay | Admitting: Physical Therapy

## 2017-06-03 ENCOUNTER — Other Ambulatory Visit: Payer: Self-pay | Admitting: Physician Assistant

## 2017-06-03 DIAGNOSIS — M25661 Stiffness of right knee, not elsewhere classified: Secondary | ICD-10-CM

## 2017-06-03 DIAGNOSIS — R262 Difficulty in walking, not elsewhere classified: Secondary | ICD-10-CM

## 2017-06-03 DIAGNOSIS — M25561 Pain in right knee: Secondary | ICD-10-CM

## 2017-06-03 DIAGNOSIS — R2241 Localized swelling, mass and lump, right lower limb: Secondary | ICD-10-CM

## 2017-06-03 NOTE — Therapy (Signed)
[name], [place], [address] Phone: [phone]   Fax:  [phone]  Physical Therapy Treatment  Patient Details  Name: [name] MRN: [id number] Date of Birth: Jan 23, 1956 Referring Provider: Alvan Dame  Encounter Date: 06/03/2017      PT End of Session - 06/03/17 0838    Visit Number 18   Date for PT Re-Evaluation 06/05/17   PT Start Time 0800   PT Stop Time 0840   PT Time Calculation (min) 40 min   Activity Tolerance Patient tolerated treatment well   Behavior During Therapy [facility] for tasks assessed/performed      Past Medical History:  Diagnosis Date  . Acne rosacea   . Acute diverticulitis 09/10/2012  . Allergic rhinitis   . Anemia   . Anxiety   . Colitis   . Diverticulosis   . Family history of adverse reaction to anesthesia    "sister's BP drops way low" (04/18/2017)  . GERD (gastroesophageal reflux disease)   . Goiter   . Hip bursitis   . Hypertension   . Hypothyroidism   . Migraines    "maybe once/year" (04/18/2017)  . Multiple thyroid nodules   . Obesity   . Osteoarthritis    "knees" (04/18/2017)  . Overactive bladder    carpal tunnel   . Shortness of breath    with exertion   . Squamous cell carcinoma 03/2017   RLE  . Tubular adenoma of colon 04/2011  . Wears glasses     Past Surgical History:  Procedure Laterality Date  . CAUTERY OF TURBINATES  1990's  . COLONOSCOPY    . EXCISIONAL HEMORRHOIDECTOMY    . GANGLION CYST EXCISION Right 06/25/2013   Procedure: RIGHT ANKLE GANGLION CYST EXCISION;  Surgeon: Wylene Simmer, MD;  Location: [place];  Service: Orthopedics;  Laterality: Right;  . INCISION AND DRAINAGE OF WOUND Right 07/23/2013   Procedure: IRRIGATION AND DEBRIDEMENT OF RIGHT ANKLE;  Surgeon: Wylene Simmer, MD;  Location: [place];  Service: Orthopedics;  Laterality: Right;  Esmark used for tourniquet--on at 1417, off at 1437.  Marland Kitchen KNEE  ARTHROSCOPY Right 11/2010  . NASAL SEPTUM SURGERY  1991  . PARTIAL KNEE ARTHROPLASTY Right 04/01/2017   Procedure: UNICOMPARTMENTAL RIGHT KNEE- Medially;  Surgeon: Paralee Cancel, MD;  [facility];  Service: Orthopedics;  Laterality: Right;  90 mins  . PARTIAL KNEE ARTHROPLASTY Left 04/2009  . PARTIAL KNEE ARTHROPLASTY Right 04/01/2017  . THYROIDECTOMY  05/08/2012   Procedure: THYROIDECTOMY;  Surgeon: Earnstine Regal, MD;  [facility];  Service: General;  Laterality: N/A;  Total Thyriodectomy  . TOTAL ABDOMINAL HYSTERECTOMY  2003   TOTAL ABDOMINAL HYSTERECTOMY W/ BILATERAL SALPINGOOPHORECTOMY  . TUBAL LIGATION    . WOUND DEBRIDEMENT Right 08/2013   ankle    There were no vitals filed for this visit.      Subjective Assessment - 06/03/17 0804    Subjective "I am doing great"   Currently in Pain? No/denies   Pain Score 0-No pain                         [facility] Adult PT Treatment/Exercise - 06/03/17 0001      Ambulation/Gait   Stairs Yes   Stair Management Technique No rails;One rail Right   Number of Stairs 24   Height of Stairs 6   Gait Comments 2 flights  Knee/Hip Exercises: Aerobic   Elliptical I15  R5 97fd/4rev      Knee/Hip Exercises: Machines for Strengthening   Cybex Knee Extension RLE 5lb 2x15   Cybex Knee Flexion 25lb 3x10 RLE   Cybex Leg Press 30lb RLE 2 x10; RLE 30lb heel raises 2x15     Knee/Hip Exercises: Standing   Forward Step Up Right;2 sets;10 reps;Step Height: 8"  explosive, 7lb dumbbell   Walking with Sports Cord resisted forward and side step to 6 in box X2 5 each                  PT Short Term Goals - 04/04/17 1031      PT SHORT TERM GOAL #1   Title independent with initial HEP   Time 2   Period Weeks   Status New           PT Long Term Goals - 06/03/17 0827      PT LONG TERM GOAL #2   Title decrease pain 50%   Status Achieved     PT LONG TERM GOAL #3   Title walk without device 500 feet with  minimal deviation   Status Achieved     PT LONG TERM GOAL #4   Title go up and down stairs step over step    Status Achieved               Plan - 06/03/17 0839    Clinical Impression Statement All goals met, Pt repots no functional lifestyle limitations.   Rehab Potential Good   PT Frequency 3x / week   PT Duration 8 weeks   PT Treatment/Interventions ADLs/Self Care Home Management;Cryotherapy;Electrical Stimulation;Gait training;Stair training;Functional mobility training;Patient/family education;Balance training;Therapeutic exercise;Therapeutic activities;Vasopneumatic Device;Manual techniques   PT Next Visit Plan D/C PT      Patient will benefit from skilled therapeutic intervention in order to improve the following deficits and impairments:  Abnormal gait, Decreased activity tolerance, Decreased balance, Decreased mobility, Decreased strength, Impaired sensation, Decreased scar mobility, Pain, Decreased range of motion, Difficulty walking  Visit Diagnosis: Localized swelling, mass and lump, right lower limb  Difficulty in walking, not elsewhere classified  Stiffness of right knee, not elsewhere classified  Acute pain of right knee     Problem List Patient Active Problem List   Diagnosis Date Noted  . Colitis 04/20/2017  . Diverticulitis 04/18/2017  . Constipation due to opioid therapy 04/18/2017  . Migraine headache without aura 04/18/2017  . Generalized abdominal pain   . S/P right unicompartmental knee replacement 04/01/2017  . Obesity (BMI 30.0-34.9) 11/27/2012  . Hypothyroidism 09/13/2012  . Acute diverticulitis 09/10/2012  . Hypertension 09/10/2012  . Rosacea 09/10/2012  . Thyroid cancer, follicular variant of papillary thyroid carcinoma 06/09/2012  . Multinodular goiter (nontoxic) 04/17/2012  . GERD 05/26/2009    PHYSICAL THERAPY DISCHARGE SUMMARY  Visits from Start of Care: 18 Plan: Patient agrees to discharge.  Patient goals were met. Patient  is being discharged due to meeting the stated rehab goals.  ?????     RScot Jun PTA 06/03/2017, 8:40 AM  [place] [address]Phone: [phone]  Fax:  [phone] Name: Robin STRUTHERSMRN: [id number] of Birth: [birth year]

## 2018-02-09 ENCOUNTER — Encounter: Payer: Self-pay | Admitting: Cardiology

## 2018-02-09 NOTE — Progress Notes (Signed)
Cardiology Office Note   Date:  02/10/2018   ID:  [name], DOB December 02, 1955, MRN [id number]  PCP:  Harlan Stains, MD  Cardiologist:   No primary care provider on file.   Chief Complaint  Patient presents with  . Chest Pain      History of Present Illness: [name] is a 62 y.o. female who is referred by Harlan Stains, MD for evaluation of chest pain.  She is never had any cardiac history.  A few weeks ago she had episodes of burping and reflux that was similar to her previous reflux but she typically does not get this when she is taking her medications and she had to take Rolaids.  This lasted for a couple of weeks.  It was a burning discomfort in her throat.  It was not unlike previous.  There is no associated nausea vomiting or diaphoresis.  She did not have any new shortness of breath, PND or orthopnea.  What happens sporadically.  She was describing some palpitations which was a more rapid heart rate when she go to bed at night but she would be able to fall asleep.  She was not describing otherwise  presyncope or syncope.  She is never had any prior cardiac testing and her only risk factor is high blood pressure.  Past Medical History:  Diagnosis Date  . Acute diverticulitis 09/10/2012  . Anemia   . Anxiety   . Carpal tunnel syndrome   . Colitis   . Diverticulosis   . Family history of adverse reaction to anesthesia    "sister's BP drops way low" (04/18/2017)  . GERD (gastroesophageal reflux disease)   . Goiter   . Hip bursitis   . Hypertension   . Hypothyroidism   . Migraines    "maybe once/year" (04/18/2017)  . Multiple thyroid nodules   . Osteoarthritis    "knees" (04/18/2017)  . Squamous cell carcinoma 03/2017   RLE  . Tubular adenoma of colon 04/2011    Past Surgical History:  Procedure Laterality Date  . CAUTERY OF TURBINATES  1990's  . COLONOSCOPY    . EXCISIONAL HEMORRHOIDECTOMY    . GANGLION CYST EXCISION Right 06/25/2013   Procedure: RIGHT ANKLE  GANGLION CYST EXCISION;  Surgeon: Wylene Simmer, MD;  Location: [facility];  Service: Orthopedics;  Laterality: Right;  . INCISION AND DRAINAGE OF WOUND Right 07/23/2013   Procedure: IRRIGATION AND DEBRIDEMENT OF RIGHT ANKLE;  Surgeon: Wylene Simmer, MD;  Location: [facility];  Service: Orthopedics;  Laterality: Right;  Esmark used for tourniquet--on at 1417, off at 1437.  Marland Kitchen KNEE ARTHROSCOPY Right 11/2010  . NASAL SEPTUM SURGERY  1991  . PARTIAL KNEE ARTHROPLASTY Right 04/01/2017   Procedure: UNICOMPARTMENTAL RIGHT KNEE- Medially;  Surgeon: Paralee Cancel, MD;  Location: [facility];  Service: Orthopedics;  Laterality: Right;  90 mins  . PARTIAL KNEE ARTHROPLASTY Left 04/2009  . THYROIDECTOMY  05/08/2012   Procedure: THYROIDECTOMY;  Surgeon: Earnstine Regal, MD;  Location: [facility];  Service: General;  Laterality: N/A;  Total Thyriodectomy  . TOTAL ABDOMINAL HYSTERECTOMY  2003   TOTAL ABDOMINAL HYSTERECTOMY W/ BILATERAL SALPINGOOPHORECTOMY  . TUBAL LIGATION    . WOUND DEBRIDEMENT Right 08/2013   ankle     Current Outpatient Medications  Medication Sig Dispense Refill  . Calcium Carbonate-Vit D-Min (CALCIUM 1200 PO) Take 1,200 Units by mouth daily.    . fluticasone (FLONASE) 50 MCG/ACT nasal spray Place 2 sprays into  the nose daily as needed for allergies.     Marland Kitchen irbesartan (AVAPRO) 300 MG tablet Take 1 tablet (300 mg total) by mouth daily. 30 tablet 3  . lansoprazole (PREVACID) 15 MG capsule Take 15 mg by mouth daily.    Marland Kitchen levothyroxine (SYNTHROID, LEVOTHROID) 175 MCG tablet Take 175 mcg by mouth at bedtime.    Marland Kitchen LORazepam (ATIVAN) 0.5 MG tablet Take 0.5 mg by mouth 2 (two) times daily as needed for anxiety.     . metroNIDAZOLE (METROCREAM) 0.75 % cream Apply 1 application topically 2 (two) times daily.     . Probiotic Product (RESTORA) CAPS TAKE ONE CAPSULE BY MOUTH EVERY DAY (NEED APPT FOR FURTHER REFILLS) (Patient taking differently: Take 1 capsule by mouth daily. TAKE ONE  CAPSULE BY MOUTH EVERY DAY (NEED APPT FOR FURTHER REFILLS)) 30 capsule 0  . UNABLE TO FIND OUTPATIENT PHYSICAL THERAPY   Diagnosis: partial TKA 04/01/17 on RIGHT, ac diverticulitis 1 Mutually Defined 0  . valACYclovir (VALTREX) 500 MG tablet Take 1,000 mg by mouth 2 (two) times daily as needed (fever blisters).    . zolpidem (AMBIEN) 5 MG tablet Take 2.5 mg by mouth at bedtime as needed for sleep.     No current facility-administered medications for this visit.     Allergies:   Cephalexin; Macrobid [nitrofurantoin macrocrystal]; Zithromax [azithromycin]; and Tramadol    Social History:  The patient  reports that she has never smoked. She has never used smokeless tobacco. She reports that she does not drink alcohol or use drugs.   Family History:  The patient's family history includes Breast cancer in her mother; Colon cancer in her maternal grandfather; Diabetes in her mother; Heart failure (age of onset: 2) in her mother; Hypertension in her sister; Pulmonary embolism (age of onset: 86) in her father.    ROS:  Please see the history of present illness.   Otherwise, review of systems are positive for none.   All other systems are reviewed and negative.    PHYSICAL EXAM: VS:  BP 126/64   Pulse 72   Ht 5\' 7"  (1.702 m)   Wt 204 lb (92.5 kg)   BMI 31.95 kg/m  , BMI Body mass index is 31.95 kg/m. GENERAL:  Well appearing HEENT:  Pupils equal round and reactive, fundi not visualized, oral mucosa unremarkable NECK:  No jugular venous distention, waveform within normal limits, carotid upstroke brisk and symmetric, no bruits, no thyromegaly LYMPHATICS:  No cervical, inguinal adenopathy LUNGS:  Clear to auscultation bilaterally BACK:  No CVA tenderness CHEST:  Unremarkable HEART:  PMI not displaced or sustained,S1 and S2 within normal limits, no S3, no S4, no clicks, no rubs, no murmurs ABD:  Flat, positive bowel sounds normal in frequency in pitch, no bruits, no rebound, no guarding, no  midline pulsatile mass, no hepatomegaly, no splenomegaly EXT:  2 plus pulses throughout, no edema, no cyanosis no clubbing SKIN:  No rashes no nodules NEURO:  Cranial nerves II through XII grossly intact, motor grossly intact throughout PSYCH:  Cognitively intact, oriented to person place and time    EKG:  EKG is not ordered today. The ekg ordered 02/04/18 demonstrates sinus rhythm, rate 67, axis within normal limits, intervals within normal limits, poor anterior R wave progression   Recent Labs: 04/19/2017: Magnesium 2.0 04/22/2017: Hemoglobin 12.1; Platelets 381 04/23/2017: BUN 8; Creatinine, Ser 0.83; Potassium 3.7; Sodium 140    Lipid Panel No results found for: CHOL, TRIG, HDL, CHOLHDL, VLDL, LDLCALC, LDLDIRECT  Wt Readings from Last 3 Encounters:  02/10/18 204 lb (92.5 kg)  05/27/17 188 lb (85.3 kg)  05/09/17 188 lb 6 oz (85.4 kg)      Other studies Reviewed: Additional studies/ records that were reviewed today include: Labs and outside office records. Review of the above records demonstrates:  Please see elsewhere in the note.     ASSESSMENT AND PLAN:  CHEST PAIN:   Her chest pain is atypical.  She does not have significant cardiovascular risk factors.  At this point I do not think further cardiovascular testing or screening is indicated rather she needs aggressive primary risk reduction we talked about this for quite a while.  Certainly if the pain recurs I would be  happy to reevaluate.  OBESITY:   We talked at length about diet and exercise and I gave her specific instructions.  DYSLIPIDEMIA: LDL was 110 and HDL 73.  At this point there is no indication for change in therapy. Current medicines are reviewed at length with the patient today.  The patient does not have concerns regarding medicines.  The following changes have been made:  no change  Labs/ tests ordered today include: None No orders of the defined types were placed in this  encounter.    Disposition:   FU with me as needed.      Signed, Minus Breeding, MD  02/10/2018 10:52 AM    [facility]

## 2018-02-10 ENCOUNTER — Encounter: Payer: Self-pay | Admitting: Cardiology

## 2018-02-10 ENCOUNTER — Ambulatory Visit (INDEPENDENT_AMBULATORY_CARE_PROVIDER_SITE_OTHER): Payer: BLUE CROSS/BLUE SHIELD | Admitting: Cardiology

## 2018-02-10 VITALS — BP 126/64 | HR 72 | Ht 67.0 in | Wt 204.0 lb

## 2018-02-10 DIAGNOSIS — E785 Hyperlipidemia, unspecified: Secondary | ICD-10-CM

## 2018-02-10 DIAGNOSIS — R079 Chest pain, unspecified: Secondary | ICD-10-CM

## 2018-02-10 DIAGNOSIS — I10 Essential (primary) hypertension: Secondary | ICD-10-CM

## 2018-02-10 NOTE — Patient Instructions (Signed)
Medication Instructions:  Continue current medications  If you need a refill on your cardiac medications before your next appointment, please call your pharmacy.  Labwork: None Ordered  Testing/Procedures: None Ordered  Follow-Up: Your physician wants you to follow-up in: As Needed.      Thank you for choosing [facility]!!       

## 2018-02-20 IMAGING — CT CT ABD-PELV W/ CM
2 of 6 series · 15 of 46 positions shown, 17 images · IV contrast (APPLIED)
Comparison: CT abdomen and pelvis dated September 10, 2012.

CLINICAL DATA: Abdominal pain and discomfort.  Recent surgery.

EXAM:
CT ABDOMEN AND PELVIS WITH CONTRAST
TECHNIQUE: Multidetector CT imaging of the abdomen and pelvis was performed
using the standard protocol following bolus administration of
intravenous contrast.
CONTRAST:  100 cc [id number] intravenous contrast.

[Series 4: abdomen 2.0 · axial · 0.93mm/px · z∈[+890,+1330]mm · 12 of 250 slices shown, 14 images]
[im 15/250  soft-tissue]
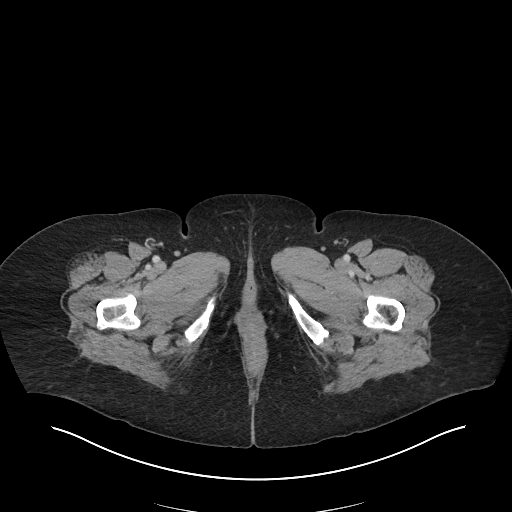
[im 15/250  bone]
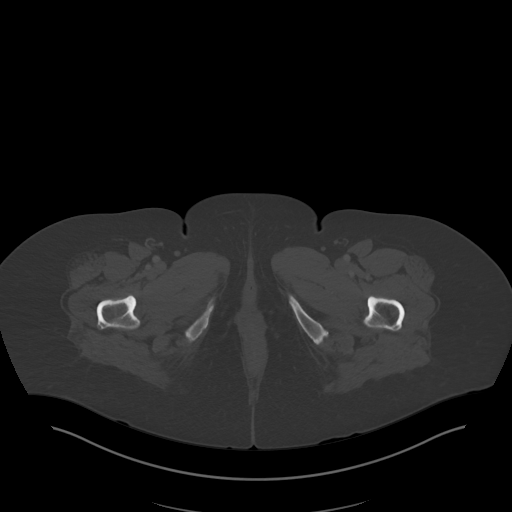
[im 44/250  soft-tissue]
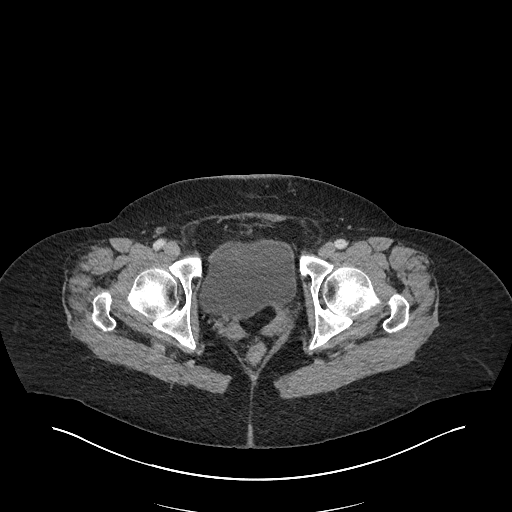
[im 59/250  soft-tissue]
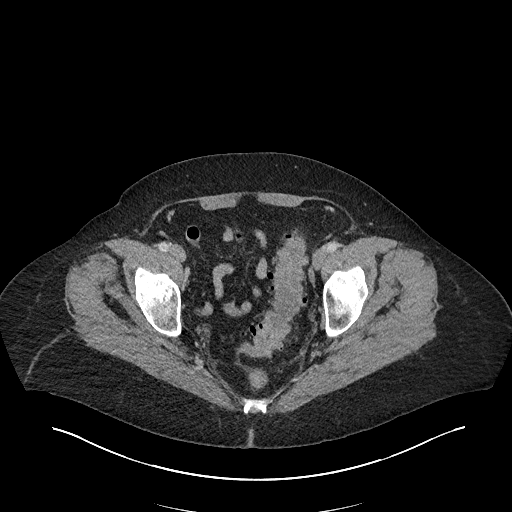
[im 74/250  soft-tissue]
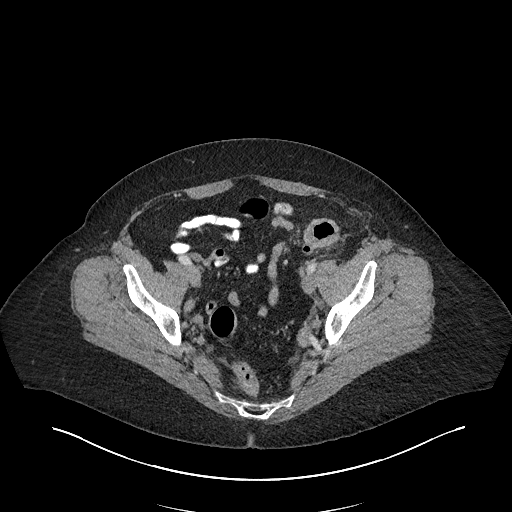
[im 103/250  soft-tissue]
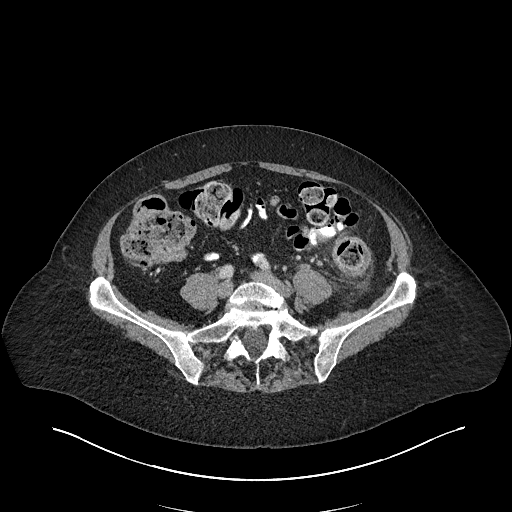
[im 118/250  soft-tissue]
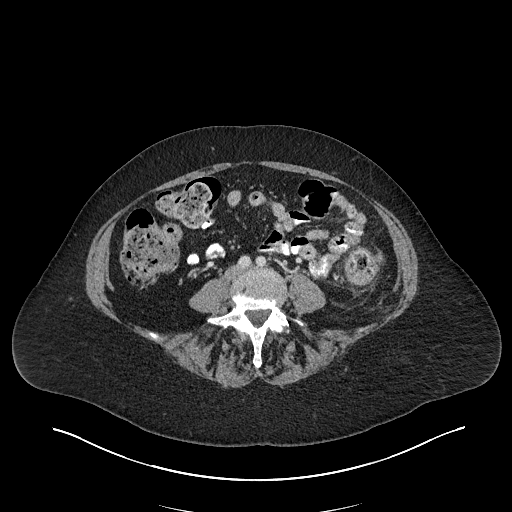
[im 132/250  soft-tissue]
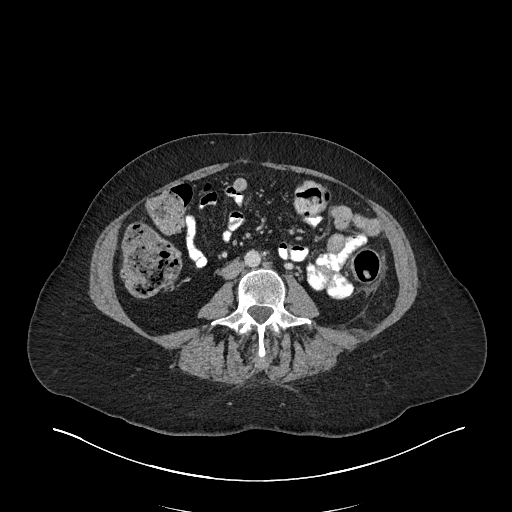
[im 162/250  soft-tissue]
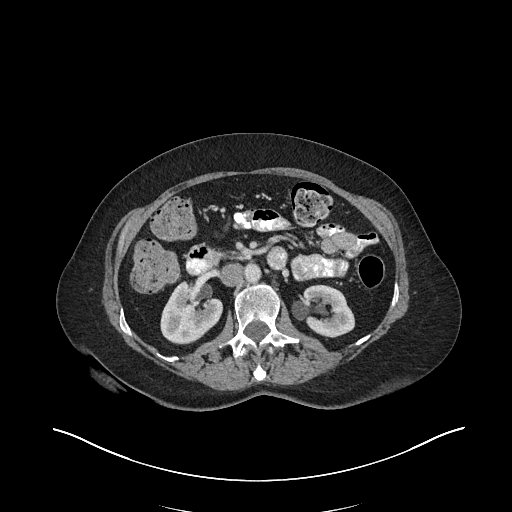
[im 176/250  soft-tissue]
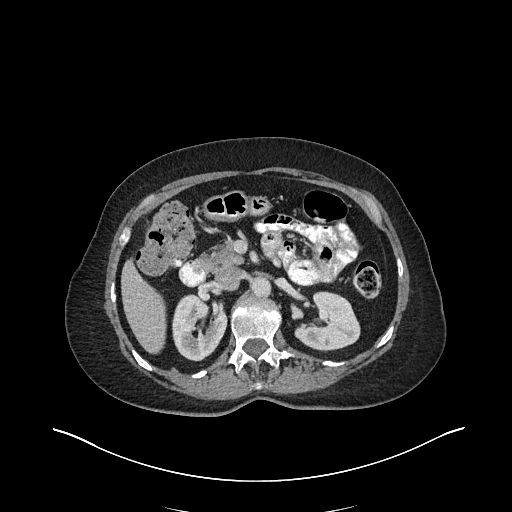
[im 176/250  bone]
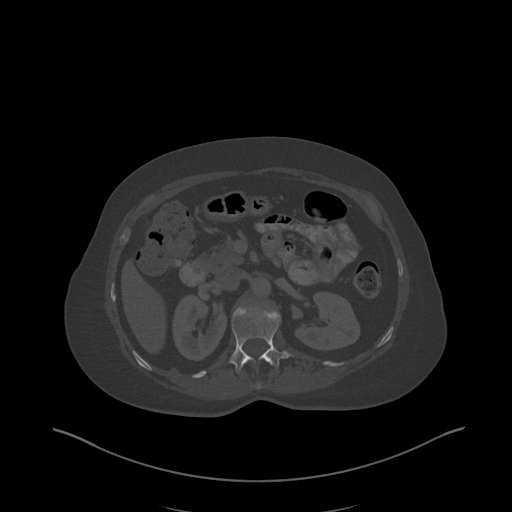
[im 191/250  soft-tissue]
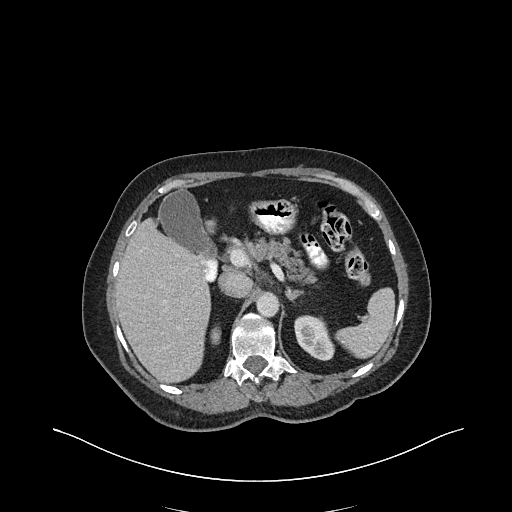
[im 220/250  soft-tissue]
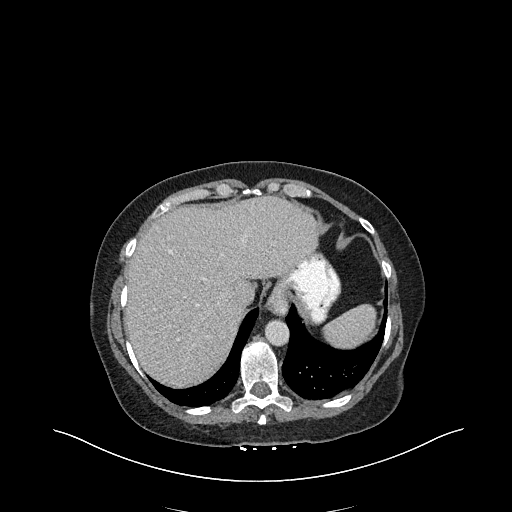
[im 235/250  soft-tissue]
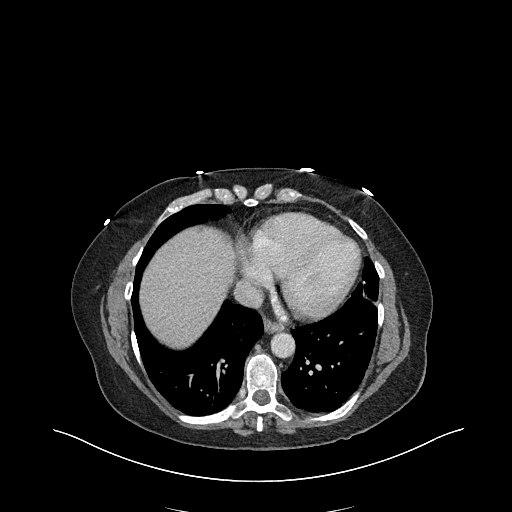

[Series 6: abdomen 3.0 mpr cor · coronal · 0.71mm/px · 3 of 98 slices shown]
[im 33/98  soft-tissue]
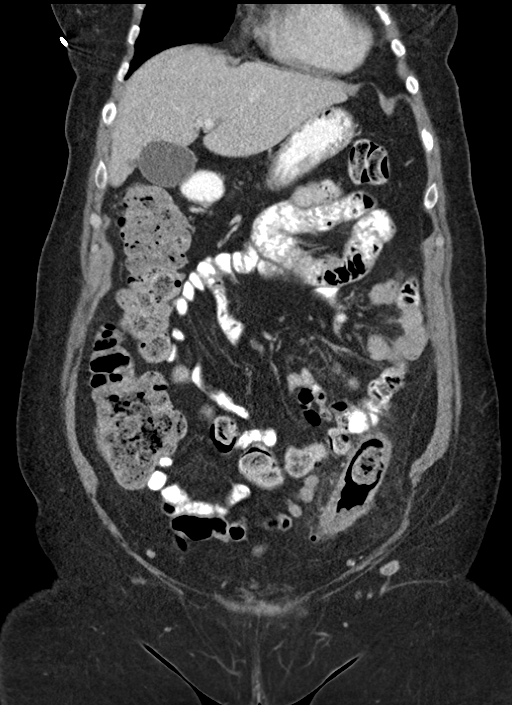
[im 44/98  soft-tissue]
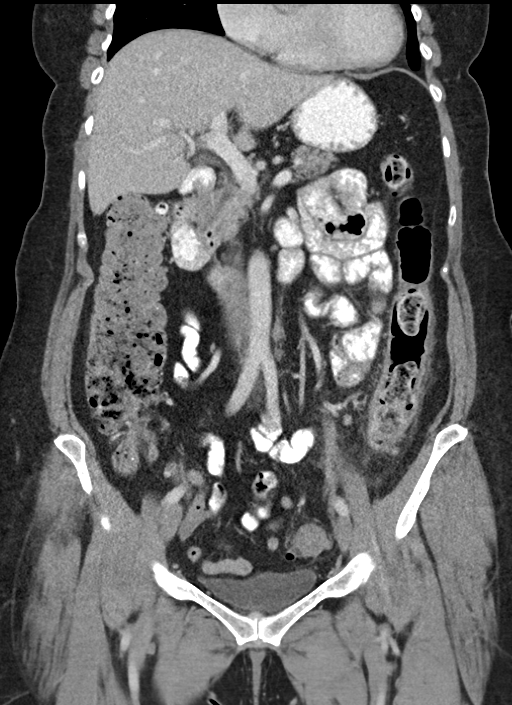
[im 54/98  soft-tissue]
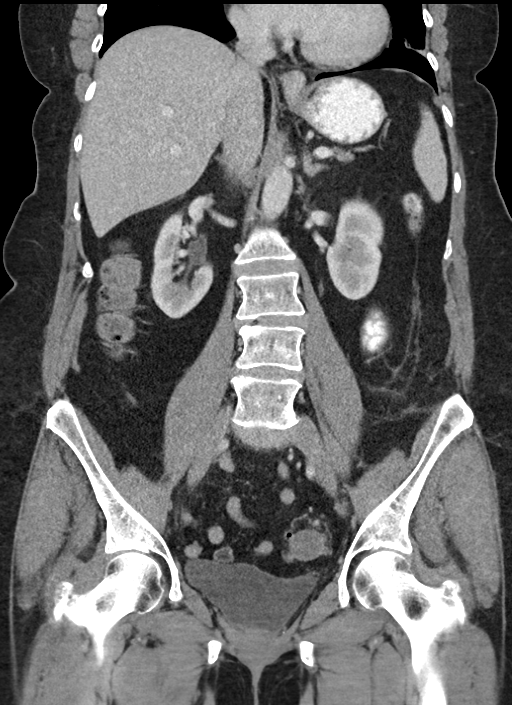

[15 of 46 positions shown; findings below may reference images not displayed]

FINDINGS: Lower chest: Bibasilar atelectasis.  No acute abnormality.

Hepatobiliary: No focal liver abnormality is seen. No gallstones,
gallbladder wall thickening, or biliary dilatation.

Pancreas: Unremarkable. No pancreatic ductal dilatation or
surrounding inflammatory changes.

Spleen: Normal in size without focal abnormality.

Adrenals/Urinary Tract: Adrenal glands are unremarkable. Kidneys are
normal, without renal calculi, focal lesion, or hydronephrosis.
Bladder is unremarkable.

Stomach/Bowel: Extensive diverticulosis with long segment prominent
wall thickening and pericolonic fat stranding of the distal
descending and proximal sigmoid colon. No pneumoperitoneum or
drainable fluid collection.

The stomach and small bowel are unremarkable.  No obstruction.

Vascular/Lymphatic: No significant vascular findings are present. No
enlarged abdominal or pelvic lymph nodes.

Reproductive: Status post hysterectomy. No adnexal masses.

Other: No abdominal wall hernia or abnormality. Trace free fluid in
the left paracolic gutter and pelvis.

Musculoskeletal: No acute or significant osseous findings.
IMPRESSION: 1. Long segment wall thickening and pericolonic fat stranding of the
distal descending and proximal sigmoid colon, favored to represent
acute uncomplicated diverticulitis given the presence of several
inflamed diverticula. The length of the involved colon, however, is
greater than typical for diverticulitis, and additional etiologies
such as infectious or inflammatory colitis should be considered. No
evidence of perforation or drainable fluid collection.
2. No evidence of bowel obstruction as clinically queried.

## 2018-05-07 ENCOUNTER — Other Ambulatory Visit: Payer: Self-pay | Admitting: Obstetrics and Gynecology

## 2018-05-07 DIAGNOSIS — Z1231 Encounter for screening mammogram for malignant neoplasm of breast: Secondary | ICD-10-CM

## 2018-06-02 ENCOUNTER — Ambulatory Visit
Admission: RE | Admit: 2018-06-02 | Discharge: 2018-06-02 | Disposition: A | Discharge status: Home / Self Care | Payer: BLUE CROSS/BLUE SHIELD | Source: Ambulatory Visit | Attending: Obstetrics and Gynecology | Admitting: Obstetrics and Gynecology

## 2018-06-02 DIAGNOSIS — Z1231 Encounter for screening mammogram for malignant neoplasm of breast: Secondary | ICD-10-CM

## 2018-12-11 ENCOUNTER — Telehealth: Payer: Self-pay | Admitting: Gastroenterology

## 2018-12-11 NOTE — Telephone Encounter (Signed)
Pt returned your call, pls call her again.  °

## 2018-12-11 NOTE — Telephone Encounter (Signed)
Patient states she can no longer get Restora through [place] and has tried all the other probiotics which did not help with her IBS. Patient was wondering if we recommended any other ones. Informed patient call [facility] and price VSL #3 to see if this is affordable and can help with her IBS. Patient states she will try it.

## 2018-12-11 NOTE — Telephone Encounter (Signed)
Pt called in and stated that dr.stark referred her to the medication restora that she was buying over counter. However when she went to [facility] to purchase it was not avail. She is wanting to know do he recommend something else that is close to that because it was really helping her ibs.

## 2018-12-11 NOTE — Telephone Encounter (Signed)
Left a message for patient to return my call. 

## 2019-07-01 ENCOUNTER — Other Ambulatory Visit: Payer: Self-pay

## 2019-07-01 DIAGNOSIS — Z20822 Contact with and (suspected) exposure to covid-19: Secondary | ICD-10-CM

## 2019-07-03 LAB — NOVEL CORONAVIRUS, NAA: SARS-CoV-2, NAA: DETECTED — AB

## 2019-07-10 ENCOUNTER — Other Ambulatory Visit: Payer: Self-pay

## 2019-07-10 DIAGNOSIS — Z20822 Contact with and (suspected) exposure to covid-19: Secondary | ICD-10-CM

## 2019-07-11 LAB — NOVEL CORONAVIRUS, NAA: SARS-CoV-2, NAA: DETECTED — AB

## 2020-01-28 DIAGNOSIS — G542 Cervical root disorders, not elsewhere classified: Secondary | ICD-10-CM | POA: Diagnosis not present

## 2020-01-28 DIAGNOSIS — M9903 Segmental and somatic dysfunction of lumbar region: Secondary | ICD-10-CM | POA: Diagnosis not present

## 2020-01-28 DIAGNOSIS — M9901 Segmental and somatic dysfunction of cervical region: Secondary | ICD-10-CM | POA: Diagnosis not present

## 2020-01-28 DIAGNOSIS — M9902 Segmental and somatic dysfunction of thoracic region: Secondary | ICD-10-CM | POA: Diagnosis not present

## 2020-02-01 DIAGNOSIS — G542 Cervical root disorders, not elsewhere classified: Secondary | ICD-10-CM | POA: Diagnosis not present

## 2020-02-01 DIAGNOSIS — M9901 Segmental and somatic dysfunction of cervical region: Secondary | ICD-10-CM | POA: Diagnosis not present

## 2020-02-01 DIAGNOSIS — M9903 Segmental and somatic dysfunction of lumbar region: Secondary | ICD-10-CM | POA: Diagnosis not present

## 2020-02-01 DIAGNOSIS — M9902 Segmental and somatic dysfunction of thoracic region: Secondary | ICD-10-CM | POA: Diagnosis not present

## 2020-02-04 DIAGNOSIS — E890 Postprocedural hypothyroidism: Secondary | ICD-10-CM | POA: Diagnosis not present

## 2020-02-04 DIAGNOSIS — R69 Illness, unspecified: Secondary | ICD-10-CM | POA: Diagnosis not present

## 2020-02-04 DIAGNOSIS — I10 Essential (primary) hypertension: Secondary | ICD-10-CM | POA: Diagnosis not present

## 2020-02-04 DIAGNOSIS — K219 Gastro-esophageal reflux disease without esophagitis: Secondary | ICD-10-CM | POA: Diagnosis not present

## 2020-02-04 DIAGNOSIS — G4700 Insomnia, unspecified: Secondary | ICD-10-CM | POA: Diagnosis not present

## 2020-02-04 DIAGNOSIS — Z85850 Personal history of malignant neoplasm of thyroid: Secondary | ICD-10-CM | POA: Diagnosis not present

## 2020-02-04 DIAGNOSIS — E559 Vitamin D deficiency, unspecified: Secondary | ICD-10-CM | POA: Diagnosis not present

## 2020-02-04 DIAGNOSIS — Z0000 Encounter for general adult medical examination without abnormal findings: Secondary | ICD-10-CM | POA: Diagnosis not present

## 2020-02-04 DIAGNOSIS — M62838 Other muscle spasm: Secondary | ICD-10-CM | POA: Diagnosis not present

## 2020-02-04 DIAGNOSIS — Z1159 Encounter for screening for other viral diseases: Secondary | ICD-10-CM | POA: Diagnosis not present

## 2020-02-10 DIAGNOSIS — M9902 Segmental and somatic dysfunction of thoracic region: Secondary | ICD-10-CM | POA: Diagnosis not present

## 2020-02-10 DIAGNOSIS — M9901 Segmental and somatic dysfunction of cervical region: Secondary | ICD-10-CM | POA: Diagnosis not present

## 2020-02-10 DIAGNOSIS — M9903 Segmental and somatic dysfunction of lumbar region: Secondary | ICD-10-CM | POA: Diagnosis not present

## 2020-02-10 DIAGNOSIS — G542 Cervical root disorders, not elsewhere classified: Secondary | ICD-10-CM | POA: Diagnosis not present

## 2020-02-11 DIAGNOSIS — M9901 Segmental and somatic dysfunction of cervical region: Secondary | ICD-10-CM | POA: Diagnosis not present

## 2020-02-11 DIAGNOSIS — G542 Cervical root disorders, not elsewhere classified: Secondary | ICD-10-CM | POA: Diagnosis not present

## 2020-02-11 DIAGNOSIS — M9903 Segmental and somatic dysfunction of lumbar region: Secondary | ICD-10-CM | POA: Diagnosis not present

## 2020-02-11 DIAGNOSIS — M9902 Segmental and somatic dysfunction of thoracic region: Secondary | ICD-10-CM | POA: Diagnosis not present

## 2020-02-15 DIAGNOSIS — D497 Neoplasm of unspecified behavior of endocrine glands and other parts of nervous system: Secondary | ICD-10-CM | POA: Diagnosis not present

## 2020-02-15 DIAGNOSIS — E890 Postprocedural hypothyroidism: Secondary | ICD-10-CM | POA: Diagnosis not present

## 2020-03-10 DIAGNOSIS — L918 Other hypertrophic disorders of the skin: Secondary | ICD-10-CM | POA: Diagnosis not present

## 2020-03-10 DIAGNOSIS — L821 Other seborrheic keratosis: Secondary | ICD-10-CM | POA: Diagnosis not present

## 2020-03-10 DIAGNOSIS — L570 Actinic keratosis: Secondary | ICD-10-CM | POA: Diagnosis not present

## 2020-03-10 DIAGNOSIS — L578 Other skin changes due to chronic exposure to nonionizing radiation: Secondary | ICD-10-CM | POA: Diagnosis not present

## 2020-03-10 DIAGNOSIS — L814 Other melanin hyperpigmentation: Secondary | ICD-10-CM | POA: Diagnosis not present

## 2020-03-10 DIAGNOSIS — D1801 Hemangioma of skin and subcutaneous tissue: Secondary | ICD-10-CM | POA: Diagnosis not present

## 2020-03-10 DIAGNOSIS — D2239 Melanocytic nevi of other parts of face: Secondary | ICD-10-CM | POA: Diagnosis not present

## 2020-03-10 DIAGNOSIS — D2272 Melanocytic nevi of left lower limb, including hip: Secondary | ICD-10-CM | POA: Diagnosis not present

## 2020-03-29 DIAGNOSIS — R350 Frequency of micturition: Secondary | ICD-10-CM | POA: Diagnosis not present

## 2020-03-29 DIAGNOSIS — N3001 Acute cystitis with hematuria: Secondary | ICD-10-CM | POA: Diagnosis not present

## 2020-05-03 DIAGNOSIS — H9313 Tinnitus, bilateral: Secondary | ICD-10-CM | POA: Diagnosis not present

## 2020-05-03 DIAGNOSIS — H903 Sensorineural hearing loss, bilateral: Secondary | ICD-10-CM | POA: Diagnosis not present

## 2020-05-05 DIAGNOSIS — H524 Presbyopia: Secondary | ICD-10-CM | POA: Diagnosis not present

## 2020-05-05 DIAGNOSIS — H52223 Regular astigmatism, bilateral: Secondary | ICD-10-CM | POA: Diagnosis not present

## 2020-05-05 DIAGNOSIS — H5203 Hypermetropia, bilateral: Secondary | ICD-10-CM | POA: Diagnosis not present

## 2020-06-06 DIAGNOSIS — Z23 Encounter for immunization: Secondary | ICD-10-CM | POA: Diagnosis not present

## 2020-06-06 DIAGNOSIS — Z20822 Contact with and (suspected) exposure to covid-19: Secondary | ICD-10-CM | POA: Diagnosis not present

## 2020-06-06 DIAGNOSIS — J029 Acute pharyngitis, unspecified: Secondary | ICD-10-CM | POA: Diagnosis not present

## 2020-06-06 DIAGNOSIS — Z03818 Encounter for observation for suspected exposure to other biological agents ruled out: Secondary | ICD-10-CM | POA: Diagnosis not present

## 2020-06-22 DIAGNOSIS — L821 Other seborrheic keratosis: Secondary | ICD-10-CM | POA: Diagnosis not present

## 2020-06-22 DIAGNOSIS — L570 Actinic keratosis: Secondary | ICD-10-CM | POA: Diagnosis not present

## 2020-06-29 DIAGNOSIS — J069 Acute upper respiratory infection, unspecified: Secondary | ICD-10-CM | POA: Diagnosis not present

## 2020-06-30 DIAGNOSIS — Z03818 Encounter for observation for suspected exposure to other biological agents ruled out: Secondary | ICD-10-CM | POA: Diagnosis not present

## 2020-06-30 DIAGNOSIS — J069 Acute upper respiratory infection, unspecified: Secondary | ICD-10-CM | POA: Diagnosis not present

## 2020-07-29 DIAGNOSIS — Z1231 Encounter for screening mammogram for malignant neoplasm of breast: Secondary | ICD-10-CM | POA: Diagnosis not present

## 2020-08-12 DIAGNOSIS — B029 Zoster without complications: Secondary | ICD-10-CM | POA: Diagnosis not present

## 2020-08-15 DIAGNOSIS — M7061 Trochanteric bursitis, right hip: Secondary | ICD-10-CM | POA: Diagnosis not present

## 2020-08-15 DIAGNOSIS — M7062 Trochanteric bursitis, left hip: Secondary | ICD-10-CM | POA: Diagnosis not present

## 2020-09-06 DIAGNOSIS — Z01419 Encounter for gynecological examination (general) (routine) without abnormal findings: Secondary | ICD-10-CM | POA: Diagnosis not present

## 2020-09-06 DIAGNOSIS — Z6833 Body mass index (BMI) 33.0-33.9, adult: Secondary | ICD-10-CM | POA: Diagnosis not present

## 2020-09-06 DIAGNOSIS — Z20822 Contact with and (suspected) exposure to covid-19: Secondary | ICD-10-CM | POA: Diagnosis not present

## 2020-09-12 DIAGNOSIS — Z20822 Contact with and (suspected) exposure to covid-19: Secondary | ICD-10-CM | POA: Diagnosis not present

## 2020-10-13 DIAGNOSIS — N958 Other specified menopausal and perimenopausal disorders: Secondary | ICD-10-CM | POA: Diagnosis not present

## 2020-10-13 DIAGNOSIS — L820 Inflamed seborrheic keratosis: Secondary | ICD-10-CM | POA: Diagnosis not present

## 2020-10-13 DIAGNOSIS — M8588 Other specified disorders of bone density and structure, other site: Secondary | ICD-10-CM | POA: Diagnosis not present

## 2020-10-13 DIAGNOSIS — L570 Actinic keratosis: Secondary | ICD-10-CM | POA: Diagnosis not present

## 2020-11-14 DIAGNOSIS — I10 Essential (primary) hypertension: Secondary | ICD-10-CM | POA: Diagnosis not present

## 2020-11-14 DIAGNOSIS — R69 Illness, unspecified: Secondary | ICD-10-CM | POA: Diagnosis not present

## 2020-11-14 DIAGNOSIS — L989 Disorder of the skin and subcutaneous tissue, unspecified: Secondary | ICD-10-CM | POA: Diagnosis not present

## 2020-11-14 DIAGNOSIS — L719 Rosacea, unspecified: Secondary | ICD-10-CM | POA: Diagnosis not present

## 2020-11-14 DIAGNOSIS — M25561 Pain in right knee: Secondary | ICD-10-CM | POA: Diagnosis not present

## 2020-11-14 DIAGNOSIS — G4700 Insomnia, unspecified: Secondary | ICD-10-CM | POA: Diagnosis not present

## 2021-02-16 DIAGNOSIS — R198 Other specified symptoms and signs involving the digestive system and abdomen: Secondary | ICD-10-CM | POA: Diagnosis not present

## 2021-02-16 DIAGNOSIS — E890 Postprocedural hypothyroidism: Secondary | ICD-10-CM | POA: Diagnosis not present

## 2021-02-16 DIAGNOSIS — Z0000 Encounter for general adult medical examination without abnormal findings: Secondary | ICD-10-CM | POA: Diagnosis not present

## 2021-02-16 DIAGNOSIS — I10 Essential (primary) hypertension: Secondary | ICD-10-CM | POA: Diagnosis not present

## 2021-02-16 DIAGNOSIS — R69 Illness, unspecified: Secondary | ICD-10-CM | POA: Diagnosis not present

## 2021-02-16 DIAGNOSIS — E559 Vitamin D deficiency, unspecified: Secondary | ICD-10-CM | POA: Diagnosis not present

## 2021-02-16 DIAGNOSIS — L719 Rosacea, unspecified: Secondary | ICD-10-CM | POA: Diagnosis not present

## 2021-02-16 DIAGNOSIS — M8588 Other specified disorders of bone density and structure, other site: Secondary | ICD-10-CM | POA: Diagnosis not present

## 2021-02-16 DIAGNOSIS — J309 Allergic rhinitis, unspecified: Secondary | ICD-10-CM | POA: Diagnosis not present

## 2021-02-16 DIAGNOSIS — Z23 Encounter for immunization: Secondary | ICD-10-CM | POA: Diagnosis not present

## 2021-02-16 DIAGNOSIS — Z8619 Personal history of other infectious and parasitic diseases: Secondary | ICD-10-CM | POA: Diagnosis not present

## 2021-02-27 DIAGNOSIS — Z8639 Personal history of other endocrine, nutritional and metabolic disease: Secondary | ICD-10-CM | POA: Diagnosis not present

## 2021-02-27 DIAGNOSIS — E890 Postprocedural hypothyroidism: Secondary | ICD-10-CM | POA: Diagnosis not present

## 2021-03-11 DIAGNOSIS — H538 Other visual disturbances: Secondary | ICD-10-CM | POA: Diagnosis not present

## 2021-03-13 DIAGNOSIS — N390 Urinary tract infection, site not specified: Secondary | ICD-10-CM | POA: Diagnosis not present

## 2021-03-13 DIAGNOSIS — N3001 Acute cystitis with hematuria: Secondary | ICD-10-CM | POA: Diagnosis not present

## 2021-03-27 DIAGNOSIS — H5319 Other subjective visual disturbances: Secondary | ICD-10-CM | POA: Diagnosis not present

## 2021-03-27 DIAGNOSIS — H2513 Age-related nuclear cataract, bilateral: Secondary | ICD-10-CM | POA: Diagnosis not present

## 2021-04-20 DIAGNOSIS — D485 Neoplasm of uncertain behavior of skin: Secondary | ICD-10-CM | POA: Diagnosis not present

## 2021-04-20 DIAGNOSIS — D2310 Other benign neoplasm of skin of unspecified eyelid, including canthus: Secondary | ICD-10-CM | POA: Diagnosis not present

## 2021-04-20 DIAGNOSIS — L820 Inflamed seborrheic keratosis: Secondary | ICD-10-CM | POA: Diagnosis not present

## 2021-06-12 ENCOUNTER — Other Ambulatory Visit: Payer: Self-pay

## 2021-06-12 ENCOUNTER — Encounter: Payer: Self-pay | Admitting: Gastroenterology

## 2021-06-12 ENCOUNTER — Ambulatory Visit (AMBULATORY_SURGERY_CENTER): Payer: Medicare HMO | Admitting: *Deleted

## 2021-06-12 VITALS — Ht 67.0 in | Wt 201.0 lb

## 2021-06-12 DIAGNOSIS — Z86010 Personal history of colonic polyps: Secondary | ICD-10-CM

## 2021-06-12 MED ORDER — PLENVU 140 G PO SOLR: 1.0000 | ORAL | 0 refills | Status: DC

## 2021-06-12 NOTE — Progress Notes (Signed)
No egg or soy allergy known to patient  No issues known to pt with past sedation with any surgeries or procedures Patient denies ever being told they had issues or difficulty with intubation  No FH of Malignant Hyperthermia Pt is not on diet pills Pt is not on  home 02  Pt is not on blood thinners  Pt denies issues with constipation - occ issue back and forth between diarrhea and constipation prior to leveling out - will take Miralax once a month  No A fib or A flutter  EMMI video to pt or via MyChart  [employer]are coupon to pt in mail pt aware $60 Pt is fully vaccinated  for Covid   Due to the COVID-19 pandemic we are asking patients to follow certain guidelines.  Pt aware of COVID protocols and [facility] guidelines   Pt verified name, DOB, address and insurance during PV today.  Pt mailed instruction packet of Emmi video, copy of consent form to read and not return, and instruction PV completed over the phone.  Pt encouraged to call with questions or issues.  My Chart instructions to pt as well

## 2021-06-26 ENCOUNTER — Ambulatory Visit (AMBULATORY_SURGERY_CENTER): Payer: Medicare HMO | Admitting: Gastroenterology

## 2021-06-26 ENCOUNTER — Encounter: Payer: Self-pay | Admitting: Gastroenterology

## 2021-06-26 VITALS — BP 126/79 | HR 77 | Temp 97.3°F | Resp 15 | Ht 67.0 in | Wt 210.0 lb

## 2021-06-26 DIAGNOSIS — D125 Benign neoplasm of sigmoid colon: Secondary | ICD-10-CM

## 2021-06-26 DIAGNOSIS — E039 Hypothyroidism, unspecified: Secondary | ICD-10-CM | POA: Diagnosis not present

## 2021-06-26 DIAGNOSIS — Z86010 Personal history of colonic polyps: Secondary | ICD-10-CM | POA: Diagnosis not present

## 2021-06-26 DIAGNOSIS — D120 Benign neoplasm of cecum: Secondary | ICD-10-CM

## 2021-06-26 DIAGNOSIS — I10 Essential (primary) hypertension: Secondary | ICD-10-CM | POA: Diagnosis not present

## 2021-06-26 DIAGNOSIS — R69 Illness, unspecified: Secondary | ICD-10-CM | POA: Diagnosis not present

## 2021-06-26 MED ORDER — SODIUM CHLORIDE 0.9 % IV SOLN: 500.0000 mL | Freq: Once | INTRAVENOUS | Status: DC

## 2021-06-26 NOTE — Progress Notes (Signed)
Called to room to assist during endoscopic procedure.  Patient ID and intended procedure confirmed with present staff. Received instructions for my participation in the procedure from the performing physician.  

## 2021-06-26 NOTE — Progress Notes (Signed)
No problems noted in the recovery room. maw 

## 2021-06-26 NOTE — Progress Notes (Signed)
PT taken to PACU. Monitors in place. VSS. Report given to RN. 

## 2021-06-26 NOTE — Progress Notes (Signed)
Pt's states no medical or surgical changes since previsit or office visit.   CHECK-IN-AM  V/S-DT

## 2021-06-26 NOTE — Progress Notes (Signed)
History & Physical  Primary Care Physician:  Harlan Stains, MD Primary Gastroenterologist: Lucio Edward, MD  CHIEF COMPLAINT:  Personal history of colon polyps   HPI: [name] is a 65 y.o. female with a history of adenomatous colon polyps for colonoscopy.     Past Medical History:  Diagnosis Date   Acute diverticulitis 09/10/2012   Anemia    Anxiety    Carpal tunnel syndrome    Colitis    Diverticulosis    Family history of adverse reaction to anesthesia    "sister's BP drops way low" (04/18/2017)  propofol   GERD (gastroesophageal reflux disease)    Goiter    Hip bursitis    Hypertension    Hypothyroidism    Migraines    "maybe once/year" (04/18/2017)   Multiple thyroid nodules    Osteoarthritis    "knees" (04/18/2017)   Osteopenia    same level x 15 yrs   Squamous cell carcinoma 03/2017   RLE   Tubular adenoma of colon 04/2011    Past Surgical History:  Procedure Laterality Date   CAUTERY OF TURBINATES  1990's   COLONOSCOPY     COLONOSCOPY     EXCISIONAL HEMORRHOIDECTOMY     GANGLION CYST EXCISION Right 06/25/2013   Procedure: RIGHT ANKLE GANGLION CYST EXCISION;  Surgeon: Wylene Simmer, MD;  Location: [facility];  Service: Orthopedics;  Laterality: Right;   INCISION AND DRAINAGE OF WOUND Right 07/23/2013   Procedure: IRRIGATION AND DEBRIDEMENT OF RIGHT ANKLE;  Surgeon: Wylene Simmer, MD;  Location: [facility];  Service: Orthopedics;  Laterality: Right;  Esmark used for tourniquet--on at 1417, off at 1437.   KNEE ARTHROSCOPY Right 11/2010   NASAL SEPTUM SURGERY  1991   PARTIAL KNEE ARTHROPLASTY Right 04/01/2017   Procedure: UNICOMPARTMENTAL RIGHT KNEE- Medially;  Surgeon: Paralee Cancel, MD;  [facility];  Service: Orthopedics;  Laterality: Right;  90 mins   PARTIAL KNEE ARTHROPLASTY Left 04/2009   POLYPECTOMY     THYROIDECTOMY  05/08/2012   Procedure: THYROIDECTOMY;  Surgeon: Earnstine Regal, MD;  Location: [facility];  Service:  General;  Laterality: N/A;  Total Thyriodectomy   TOTAL ABDOMINAL HYSTERECTOMY  2003   TOTAL ABDOMINAL HYSTERECTOMY W/ BILATERAL SALPINGOOPHORECTOMY   TUBAL LIGATION     WOUND DEBRIDEMENT Right 08/2013   ankle    Prior to Admission medications   Medication Sig Start Date End Date Taking? Authorizing Provider  fluticasone (FLONASE) 50 MCG/ACT nasal spray Place 2 sprays into the nose daily as needed for allergies.    Yes [provider]  irbesartan-hydrochlorothiazide (AVALIDE) 300-12.5 MG tablet Take 1 tablet by mouth daily. 04/13/21  Yes [provider]  levothyroxine (SYNTHROID, LEVOTHROID) 175 MCG tablet Take 175 mcg by mouth at bedtime.   Yes [provider]  metroNIDAZOLE (METROCREAM) 0.75 % cream Apply 1 application topically 2 (two) times daily.    Yes [provider]  omeprazole (PRILOSEC) 20 MG capsule  04/20/21  Yes [provider]  Probiotic Product (PROBIOTIC DAILY PO) Garden of Life - Take one daily   Yes [provider]  Calcium Carbonate-Vit D-Min (CALCIUM 1200 PO) Take 1,200 Units by mouth daily. Patient not taking: No sig reported    [provider]  LORazepam (ATIVAN) 0.5 MG tablet Take 0.5 mg by mouth 2 (two) times daily as needed for anxiety.     [provider]  valACYclovir (VALTREX) 500 MG tablet Take 1,000 mg by mouth 2 (  two) times daily as needed (fever blisters).    [provider]  zolpidem (AMBIEN) 5 MG tablet Take 2.5 mg by mouth at bedtime as needed for sleep.    [provider]    Current Outpatient Medications  Medication Sig Dispense Refill   fluticasone (FLONASE) 50 MCG/ACT nasal spray Place 2 sprays into the nose daily as needed for allergies.      irbesartan-hydrochlorothiazide (AVALIDE) 300-12.5 MG tablet Take 1 tablet by mouth daily.     levothyroxine (SYNTHROID, LEVOTHROID) 175 MCG tablet Take 175 mcg by mouth at bedtime.     metroNIDAZOLE (METROCREAM) 0.75 % cream  Apply 1 application topically 2 (two) times daily.      omeprazole (PRILOSEC) 20 MG capsule      Probiotic Product (PROBIOTIC DAILY PO) Garden of Life - Take one daily     Calcium Carbonate-Vit D-Min (CALCIUM 1200 PO) Take 1,200 Units by mouth daily. (Patient not taking: No sig reported)     LORazepam (ATIVAN) 0.5 MG tablet Take 0.5 mg by mouth 2 (two) times daily as needed for anxiety.      valACYclovir (VALTREX) 500 MG tablet Take 1,000 mg by mouth 2 (two) times daily as needed (fever blisters).     zolpidem (AMBIEN) 5 MG tablet Take 2.5 mg by mouth at bedtime as needed for sleep.     Current Facility-Administered Medications  Medication Dose Route Frequency Provider Last Rate Last Admin   0.9 %  sodium chloride infusion  500 mL Intravenous Once Ladene Artist, MD        Allergies as of 06/26/2021 - Review Complete 06/26/2021  Allergen Reaction Noted   Lisinopril Other (See Comments) and Itching 02/04/2020   Cephalexin Hives    Ciprofloxacin Other (See Comments) 02/16/2021   Fluoxetine hcl Other (See Comments) 02/16/2021   Macrobid [nitrofurantoin macrocrystal] Nausea And Vomiting and Other (See Comments) 03/21/2017   Zithromax [azithromycin] Other (See Comments) 04/25/2012   Tramadol Nausea Only 05/09/2017    Family History  Problem Relation Age of Onset   Diabetes Mother    Breast cancer Mother    Heart failure Mother 47   Pulmonary embolism Father 53   Hypertension Sister    Colon cancer Maternal Grandfather    Colon polyps Neg Hx    Esophageal cancer Neg Hx    Stomach cancer Neg Hx    Rectal cancer Neg Hx     Social History   Socioeconomic History   Marital status: Married    Spouse name: Not on file   Number of children: 3   Years of education: Not on file   Highest education level: Not on file  Occupational History   Occupation: Speech pathology  Tobacco Use   Smoking status: Never   Smokeless tobacco: Never  Vaping Use   Vaping Use: Never used   Substance and Sexual Activity   Alcohol use: No   Drug use: No   Sexual activity: Yes  Other Topics Concern   Not on file  Social History Narrative   Lives at home with husband.     Social Determinants of Health   Financial Resource Strain: Not on file  Food Insecurity: Not on file  Transportation Needs: Not on file  Physical Activity: Not on file  Stress: Not on file  Social Connections: Not on file  Intimate Partner Violence: Not on file    Review of Systems:  All systems reviewed an negative except where noted in HPI.  Gen:  Denies any fever, chills, sweats, anorexia, fatigue, weakness, malaise, weight loss, and sleep disorder CV: Denies chest pain, angina, palpitations, syncope, orthopnea, PND, peripheral edema, and claudication. Resp: Denies dyspnea at rest, dyspnea with exercise, cough, sputum, wheezing, coughing up blood, and pleurisy. GI: Denies vomiting blood, jaundice, and fecal incontinence.   Denies dysphagia or odynophagia. GU : Denies urinary burning, blood in urine, urinary frequency, urinary hesitancy, nocturnal urination, and urinary incontinence. MS: Denies joint pain, limitation of movement, and swelling, stiffness, low back pain, extremity pain. Denies muscle weakness, cramps, atrophy.  Derm: Denies rash, itching, dry skin, hives, moles, warts, or unhealing ulcers.  Psych: Denies depression, anxiety, memory loss, suicidal ideation, hallucinations, paranoia, and confusion. Heme: Denies bruising, bleeding, and enlarged lymph nodes. Neuro:  Denies any headaches, dizziness, paresthesias. Endo:  Denies any problems with DM, thyroid, adrenal function.   Physical Exam: General:  Alert, well-developed, in NAD Head:  Normocephalic and atraumatic. Eyes:  Sclera clear, no icterus.   Conjunctiva pink. Ears:  Normal auditory acuity. Mouth:  No deformity or lesions.  Neck:  Supple; no masses . Lungs:  Clear throughout to auscultation.   No wheezes, crackles, or  rhonchi. No acute distress. Heart:  Regular rate and rhythm; no murmurs. Abdomen:  Soft, nondistended, nontender. No masses, hepatomegaly. No obvious masses.  Normal bowel .    Rectal:  Deferred   Msk:  Symmetrical without gross deformities.. Pulses:  Normal pulses noted. Extremities:  Without edema. Neurologic:  Alert and  oriented x4;  grossly normal neurologically. Skin:  Intact without significant lesions or rashes. Cervical Nodes:  No significant cervical adenopathy. Psych:  Alert and cooperative. Normal mood and affect.   Impression / Plan:   Personal history of adenomatous colon polyps for colonoscopy.    This patient is appropriate for endoscopic procedures in the ambulatory setting.    Pricilla Riffle. Fuller Plan  06/26/2021, 10:27 AM See Shea Evans, [facility], to contact our on call provider

## 2021-06-26 NOTE — Op Note (Signed)
[place] Patient Name: [name] Procedure Date: 06/26/2021 10:24 AM MRN: [id number] Endoscopist: Ladene Artist , MD Age: 65 Referring MD:  Date of Birth: [birth year] Gender: Female Account #: [id number] Procedure:                Colonoscopy Indications:              Surveillance: Personal history of adenomatous                            polyps on last colonoscopy 5 years ago Medicines:                Monitored Anesthesia Care Procedure:                Pre-Anesthesia Assessment:                           - Prior to the procedure, a History and Physical                            was performed, and patient medications and                            allergies were reviewed. The patient's tolerance of                            previous anesthesia was also reviewed. The risks                            and benefits of the procedure and the sedation                            options and risks were discussed with the patient.                            All questions were answered, and informed consent                            was obtained. Prior Anticoagulants: The patient has                            taken no previous anticoagulant or antiplatelet                            agents. ASA Grade Assessment: II - A patient with                            mild systemic disease. After reviewing the risks                            and benefits, the patient was deemed in                            satisfactory condition to undergo the procedure.  After obtaining informed consent, the colonoscope                            was passed under direct vision. Throughout the                            procedure, the patient's blood pressure, pulse, and                            oxygen saturations were monitored continuously. The                            PCF-HQ190L Colonoscope was introduced through the                            anus and advanced to  the the cecum, identified by                            appendiceal orifice and ileocecal valve. The                            ileocecal valve, appendiceal orifice, and rectum                            were photographed. The quality of the bowel                            preparation was good. The colonoscopy was performed                            without difficulty. The patient tolerated the                            procedure well. Scope In: 10:38:39 AM Scope Out: 10:56:39 AM Scope Withdrawal Time: 0 hours 15 minutes 34 seconds  Total Procedure Duration: 0 hours 18 minutes 0 seconds  Findings:                 The perianal and digital rectal examinations were                            normal.                           Two sessile polyps were found in the cecum and                            ileocecal valve. The polyps were 5 to 7 mm in size.                            These polyps were removed with a cold snare.                            Resection and retrieval were complete.  A 10 mm polyp was found in the sigmoid colon. The                            polyp was sessile. The polyp was removed with a                            cold snare. Resection and retrieval were complete.                           Multiple medium-mouthed diverticula were found in                            the right colon. There was no evidence of                            diverticular bleeding.                           Multiple medium-mouthed diverticula were found in                            the left colon. There was evidence of diverticular                            spasm. There was no evidence of diverticular                            bleeding.                           The exam was otherwise without abnormality on                            direct and retroflexion views. Complications:            No immediate complications. Estimated blood loss:                             None. Estimated Blood Loss:     Estimated blood loss: none. Impression:               - One 10 mm polyp in the sigmoid colon, removed                            with a cold snare. Resected and retrieved.                           - Two 5 to 7 mm polyps in the cecum and at the                            ileocecal valve, removed with a cold snare.                            Resected and retrieved.                           -  Mild diverticulosis in the right colon.                           - Moderate diverticulosis in the left colon.                           - The examination was otherwise normal on direct                            and retroflexion views. Recommendation:           - Repeat colonoscopy after studies are complete for                            surveillance based on pathology results.                           - Patient has a contact number available for                            emergencies. The signs and symptoms of potential                            delayed complications were discussed with the                            patient. Return to normal activities tomorrow.                            Written discharge instructions were provided to the                            patient.                           - High fiber diet.                           - Continue present medications.                           - Await pathology results. Ladene Artist, MD 06/26/2021 11:00:56 AM This report has been signed electronically.

## 2021-06-26 NOTE — Patient Instructions (Addendum)
Handouts were given to your care partner on polyps, diverticulosis, and a high fiber diet with liberal fluid intake. You may resume your current medications today. Await biopsy results.  May take 1-3 weeks to receive pathology results. Please call if any questions or concerns.      YOU HAD AN ENDOSCOPIC PROCEDURE TODAY AT [facility]:   Refer to the procedure report that was given to you for any specific questions about what was found during the examination.  If the procedure report does not answer your questions, please call your gastroenterologist to clarify.  If you requested that your care partner not be given the details of your procedure findings, then the procedure report has been included in a sealed envelope for you to review at your convenience later.  YOU SHOULD EXPECT: Some feelings of bloating in the abdomen. Passage of more gas than usual.  Walking can help get rid of the air that was put into your GI tract during the procedure and reduce the bloating. If you had a lower endoscopy (such as a colonoscopy or flexible sigmoidoscopy) you may notice spotting of blood in your stool or on the toilet paper. If you underwent a bowel prep for your procedure, you may not have a normal bowel movement for a few days.  Please Note:  You might notice some irritation and congestion in your nose or some drainage.  This is from the oxygen used during your procedure.  There is no need for concern and it should clear up in a day or so.  SYMPTOMS TO REPORT IMMEDIATELY:  Following lower endoscopy (colonoscopy or flexible sigmoidoscopy):  Excessive amounts of blood in the stool  Significant tenderness or worsening of abdominal pains  Swelling of the abdomen that is new, acute  Fever of 100F or higher   For urgent or emergent issues, a gastroenterologist can be reached at any hour by calling [phone]. Do not use MyChart messaging for urgent concerns.    DIET:  We do recommend  a small meal at first, but then you may proceed to your regular diet.  Drink plenty of fluids but you should avoid alcoholic beverages for 24 hours.  ACTIVITY:  You should plan to take it easy for the rest of today and you should NOT DRIVE or use heavy machinery until tomorrow (because of the sedation medicines used during the test).    FOLLOW UP: Our staff will call the number listed on your records 48-72 hours following your procedure to check on you and address any questions or concerns that you may have regarding the information given to you following your procedure. If we do not reach you, we will leave a message.  We will attempt to reach you two times.  During this call, we will ask if you have developed any symptoms of COVID 19. If you develop any symptoms (ie: fever, flu-like symptoms, shortness of breath, cough etc.) before then, please call [phone].  If you test positive for Covid 19 in the 2 weeks post procedure, please call and report this information to [place].    If any biopsies were taken you will be contacted by phone or by letter within the next 1-3 weeks.  Please call us at [phone] if you have not heard about the biopsies in 3 weeks.    SIGNATURES/CONFIDENTIALITY: You and/or your care partner have signed paperwork which will be entered into your electronic medical record.  These signatures attest to the fact that  that the information above on your After Visit Summary has been reviewed and is understood.  Full responsibility of the confidentiality of this discharge information lies with you and/or your care-partner.

## 2021-06-28 ENCOUNTER — Telehealth: Payer: Self-pay

## 2021-06-28 DIAGNOSIS — R69 Illness, unspecified: Secondary | ICD-10-CM | POA: Diagnosis not present

## 2021-06-28 NOTE — Telephone Encounter (Signed)
  Follow up Call-  Call back number 06/26/2021  Post procedure Call Back phone  # ,[phone]  Permission to leave phone message Yes  Some recent data might be hidden     Patient questions:  Do you have a fever, pain , or abdominal swelling? No. Pain Score  0 *  Have you tolerated food without any problems? Yes.    Have you been able to return to your normal activities? Yes.    Do you have any questions about your discharge instructions: Diet   No. Medications  No. Follow up visit  No.  Do you have questions or concerns about your Care? No.  Actions: * If pain score is 4 or above: No action needed, pain <4.  Have you developed a fever since your procedure? no  2.   Have you had an respiratory symptoms (SOB or cough) since your procedure? no  3.   Have you tested positive for COVID 19 since your procedure no  4.   Have you had any family members/close contacts diagnosed with the COVID 19 since your procedure?  no   If yes to any of these questions please route to Joylene John, RN and Joella Prince, RN

## 2021-07-03 DIAGNOSIS — N390 Urinary tract infection, site not specified: Secondary | ICD-10-CM | POA: Diagnosis not present

## 2021-07-03 DIAGNOSIS — Z23 Encounter for immunization: Secondary | ICD-10-CM | POA: Diagnosis not present

## 2021-07-03 DIAGNOSIS — R3989 Other symptoms and signs involving the genitourinary system: Secondary | ICD-10-CM | POA: Diagnosis not present

## 2021-07-11 ENCOUNTER — Encounter: Payer: Self-pay | Admitting: Gastroenterology

## 2021-07-21 DIAGNOSIS — D225 Melanocytic nevi of trunk: Secondary | ICD-10-CM | POA: Diagnosis not present

## 2021-07-21 DIAGNOSIS — Z85828 Personal history of other malignant neoplasm of skin: Secondary | ICD-10-CM | POA: Diagnosis not present

## 2021-07-21 DIAGNOSIS — L810 Postinflammatory hyperpigmentation: Secondary | ICD-10-CM | POA: Diagnosis not present

## 2021-07-21 DIAGNOSIS — L820 Inflamed seborrheic keratosis: Secondary | ICD-10-CM | POA: Diagnosis not present

## 2021-07-21 DIAGNOSIS — L814 Other melanin hyperpigmentation: Secondary | ICD-10-CM | POA: Diagnosis not present

## 2021-07-21 DIAGNOSIS — L538 Other specified erythematous conditions: Secondary | ICD-10-CM | POA: Diagnosis not present

## 2021-07-21 DIAGNOSIS — D485 Neoplasm of uncertain behavior of skin: Secondary | ICD-10-CM | POA: Diagnosis not present

## 2021-07-21 DIAGNOSIS — Z08 Encounter for follow-up examination after completed treatment for malignant neoplasm: Secondary | ICD-10-CM | POA: Diagnosis not present

## 2021-07-21 DIAGNOSIS — L570 Actinic keratosis: Secondary | ICD-10-CM | POA: Diagnosis not present

## 2021-07-21 DIAGNOSIS — L821 Other seborrheic keratosis: Secondary | ICD-10-CM | POA: Diagnosis not present

## 2021-07-21 DIAGNOSIS — D235 Other benign neoplasm of skin of trunk: Secondary | ICD-10-CM | POA: Diagnosis not present

## 2021-07-26 DIAGNOSIS — F4320 Adjustment disorder, unspecified: Secondary | ICD-10-CM | POA: Diagnosis not present

## 2021-07-26 DIAGNOSIS — R69 Illness, unspecified: Secondary | ICD-10-CM | POA: Diagnosis not present

## 2021-08-02 DIAGNOSIS — H5202 Hypermetropia, left eye: Secondary | ICD-10-CM | POA: Diagnosis not present

## 2021-08-02 DIAGNOSIS — H52223 Regular astigmatism, bilateral: Secondary | ICD-10-CM | POA: Diagnosis not present

## 2021-08-02 DIAGNOSIS — H524 Presbyopia: Secondary | ICD-10-CM | POA: Diagnosis not present

## 2021-08-08 DIAGNOSIS — R69 Illness, unspecified: Secondary | ICD-10-CM | POA: Diagnosis not present

## 2021-08-08 DIAGNOSIS — F4320 Adjustment disorder, unspecified: Secondary | ICD-10-CM | POA: Diagnosis not present

## 2021-08-09 DIAGNOSIS — I10 Essential (primary) hypertension: Secondary | ICD-10-CM | POA: Diagnosis not present

## 2021-08-09 DIAGNOSIS — K219 Gastro-esophageal reflux disease without esophagitis: Secondary | ICD-10-CM | POA: Diagnosis not present

## 2021-08-09 DIAGNOSIS — Z823 Family history of stroke: Secondary | ICD-10-CM | POA: Diagnosis not present

## 2021-08-09 DIAGNOSIS — E669 Obesity, unspecified: Secondary | ICD-10-CM | POA: Diagnosis not present

## 2021-08-09 DIAGNOSIS — Z85828 Personal history of other malignant neoplasm of skin: Secondary | ICD-10-CM | POA: Diagnosis not present

## 2021-08-09 DIAGNOSIS — M8580 Other specified disorders of bone density and structure, unspecified site: Secondary | ICD-10-CM | POA: Diagnosis not present

## 2021-08-09 DIAGNOSIS — Z008 Encounter for other general examination: Secondary | ICD-10-CM | POA: Diagnosis not present

## 2021-08-09 DIAGNOSIS — B009 Herpesviral infection, unspecified: Secondary | ICD-10-CM | POA: Diagnosis not present

## 2021-08-09 DIAGNOSIS — R69 Illness, unspecified: Secondary | ICD-10-CM | POA: Diagnosis not present

## 2021-08-09 DIAGNOSIS — E890 Postprocedural hypothyroidism: Secondary | ICD-10-CM | POA: Diagnosis not present

## 2021-08-09 DIAGNOSIS — Z79899 Other long term (current) drug therapy: Secondary | ICD-10-CM | POA: Diagnosis not present

## 2021-08-09 DIAGNOSIS — L719 Rosacea, unspecified: Secondary | ICD-10-CM | POA: Diagnosis not present

## 2021-08-09 DIAGNOSIS — Z6832 Body mass index (BMI) 32.0-32.9, adult: Secondary | ICD-10-CM | POA: Diagnosis not present

## 2021-08-09 DIAGNOSIS — J309 Allergic rhinitis, unspecified: Secondary | ICD-10-CM | POA: Diagnosis not present

## 2021-08-09 DIAGNOSIS — Z8249 Family history of ischemic heart disease and other diseases of the circulatory system: Secondary | ICD-10-CM | POA: Diagnosis not present

## 2021-08-14 DIAGNOSIS — N390 Urinary tract infection, site not specified: Secondary | ICD-10-CM | POA: Diagnosis not present

## 2021-08-14 DIAGNOSIS — R69 Illness, unspecified: Secondary | ICD-10-CM | POA: Diagnosis not present

## 2021-08-14 DIAGNOSIS — I10 Essential (primary) hypertension: Secondary | ICD-10-CM | POA: Diagnosis not present

## 2021-08-14 DIAGNOSIS — J309 Allergic rhinitis, unspecified: Secondary | ICD-10-CM | POA: Diagnosis not present

## 2021-08-14 DIAGNOSIS — B309 Viral conjunctivitis, unspecified: Secondary | ICD-10-CM | POA: Diagnosis not present

## 2021-08-14 DIAGNOSIS — E890 Postprocedural hypothyroidism: Secondary | ICD-10-CM | POA: Diagnosis not present

## 2021-08-14 DIAGNOSIS — G4700 Insomnia, unspecified: Secondary | ICD-10-CM | POA: Diagnosis not present

## 2021-08-21 DIAGNOSIS — N3001 Acute cystitis with hematuria: Secondary | ICD-10-CM | POA: Diagnosis not present

## 2021-08-21 DIAGNOSIS — R350 Frequency of micturition: Secondary | ICD-10-CM | POA: Diagnosis not present

## 2021-08-21 DIAGNOSIS — R300 Dysuria: Secondary | ICD-10-CM | POA: Diagnosis not present

## 2021-09-04 DIAGNOSIS — R350 Frequency of micturition: Secondary | ICD-10-CM | POA: Diagnosis not present

## 2021-09-04 DIAGNOSIS — R300 Dysuria: Secondary | ICD-10-CM | POA: Diagnosis not present

## 2021-09-25 DIAGNOSIS — R300 Dysuria: Secondary | ICD-10-CM | POA: Diagnosis not present

## 2021-09-25 DIAGNOSIS — R350 Frequency of micturition: Secondary | ICD-10-CM | POA: Diagnosis not present

## 2021-11-17 DIAGNOSIS — N3020 Other chronic cystitis without hematuria: Secondary | ICD-10-CM | POA: Diagnosis not present

## 2021-11-17 DIAGNOSIS — R3982 Chronic bladder pain: Secondary | ICD-10-CM | POA: Diagnosis not present

## 2021-11-17 DIAGNOSIS — N3000 Acute cystitis without hematuria: Secondary | ICD-10-CM | POA: Diagnosis not present

## 2021-11-29 DIAGNOSIS — J029 Acute pharyngitis, unspecified: Secondary | ICD-10-CM | POA: Diagnosis not present

## 2021-12-20 DIAGNOSIS — N644 Mastodynia: Secondary | ICD-10-CM | POA: Diagnosis not present

## 2021-12-21 ENCOUNTER — Other Ambulatory Visit: Payer: Self-pay | Admitting: Nurse Practitioner

## 2021-12-21 DIAGNOSIS — N644 Mastodynia: Secondary | ICD-10-CM

## 2021-12-25 DIAGNOSIS — N3020 Other chronic cystitis without hematuria: Secondary | ICD-10-CM | POA: Diagnosis not present

## 2021-12-26 DIAGNOSIS — Z862 Personal history of diseases of the blood and blood-forming organs and certain disorders involving the immune mechanism: Secondary | ICD-10-CM | POA: Diagnosis not present

## 2021-12-26 DIAGNOSIS — E890 Postprocedural hypothyroidism: Secondary | ICD-10-CM | POA: Diagnosis not present

## 2021-12-26 DIAGNOSIS — K1370 Unspecified lesions of oral mucosa: Secondary | ICD-10-CM | POA: Diagnosis not present

## 2021-12-26 DIAGNOSIS — R5383 Other fatigue: Secondary | ICD-10-CM | POA: Diagnosis not present

## 2021-12-26 DIAGNOSIS — E559 Vitamin D deficiency, unspecified: Secondary | ICD-10-CM | POA: Diagnosis not present

## 2022-01-02 ENCOUNTER — Ambulatory Visit
Admission: RE | Admit: 2022-01-02 | Discharge: 2022-01-02 | Disposition: A | Discharge status: Home / Self Care | Payer: Medicare HMO | Source: Ambulatory Visit | Attending: Nurse Practitioner | Admitting: Nurse Practitioner

## 2022-01-02 DIAGNOSIS — N644 Mastodynia: Secondary | ICD-10-CM

## 2022-01-02 DIAGNOSIS — R928 Other abnormal and inconclusive findings on diagnostic imaging of breast: Secondary | ICD-10-CM | POA: Diagnosis not present

## 2022-02-03 DIAGNOSIS — R350 Frequency of micturition: Secondary | ICD-10-CM | POA: Diagnosis not present

## 2022-02-03 DIAGNOSIS — N3001 Acute cystitis with hematuria: Secondary | ICD-10-CM | POA: Diagnosis not present

## 2022-02-23 DIAGNOSIS — E890 Postprocedural hypothyroidism: Secondary | ICD-10-CM | POA: Diagnosis not present

## 2022-02-23 DIAGNOSIS — K219 Gastro-esophageal reflux disease without esophagitis: Secondary | ICD-10-CM | POA: Diagnosis not present

## 2022-02-23 DIAGNOSIS — Z8619 Personal history of other infectious and parasitic diseases: Secondary | ICD-10-CM | POA: Diagnosis not present

## 2022-02-23 DIAGNOSIS — Z862 Personal history of diseases of the blood and blood-forming organs and certain disorders involving the immune mechanism: Secondary | ICD-10-CM | POA: Diagnosis not present

## 2022-02-23 DIAGNOSIS — R69 Illness, unspecified: Secondary | ICD-10-CM | POA: Diagnosis not present

## 2022-02-23 DIAGNOSIS — Z0000 Encounter for general adult medical examination without abnormal findings: Secondary | ICD-10-CM | POA: Diagnosis not present

## 2022-02-23 DIAGNOSIS — I10 Essential (primary) hypertension: Secondary | ICD-10-CM | POA: Diagnosis not present

## 2022-02-23 DIAGNOSIS — J309 Allergic rhinitis, unspecified: Secondary | ICD-10-CM | POA: Diagnosis not present

## 2022-02-23 DIAGNOSIS — G4700 Insomnia, unspecified: Secondary | ICD-10-CM | POA: Diagnosis not present

## 2022-02-23 DIAGNOSIS — E559 Vitamin D deficiency, unspecified: Secondary | ICD-10-CM | POA: Diagnosis not present

## 2022-03-15 DIAGNOSIS — Z0100 Encounter for examination of eyes and vision without abnormal findings: Secondary | ICD-10-CM | POA: Diagnosis not present

## 2022-03-29 DIAGNOSIS — R350 Frequency of micturition: Secondary | ICD-10-CM | POA: Diagnosis not present

## 2022-03-29 DIAGNOSIS — J0140 Acute pansinusitis, unspecified: Secondary | ICD-10-CM | POA: Diagnosis not present

## 2022-03-29 DIAGNOSIS — Z6831 Body mass index (BMI) 31.0-31.9, adult: Secondary | ICD-10-CM | POA: Diagnosis not present

## 2022-03-29 DIAGNOSIS — B3731 Acute candidiasis of vulva and vagina: Secondary | ICD-10-CM | POA: Diagnosis not present

## 2022-04-10 DIAGNOSIS — I10 Essential (primary) hypertension: Secondary | ICD-10-CM | POA: Diagnosis not present

## 2022-04-10 DIAGNOSIS — E890 Postprocedural hypothyroidism: Secondary | ICD-10-CM | POA: Diagnosis not present

## 2022-04-12 ENCOUNTER — Other Ambulatory Visit: Payer: Self-pay

## 2022-04-12 ENCOUNTER — Telehealth: Payer: Self-pay | Admitting: Gastroenterology

## 2022-04-12 ENCOUNTER — Encounter (HOSPITAL_BASED_OUTPATIENT_CLINIC_OR_DEPARTMENT_OTHER): Payer: Self-pay | Admitting: Emergency Medicine

## 2022-04-12 ENCOUNTER — Emergency Department (HOSPITAL_BASED_OUTPATIENT_CLINIC_OR_DEPARTMENT_OTHER)
Admission: EM | Admit: 2022-04-12 | Discharge: 2022-04-12 | Disposition: A | Discharge status: Home / Self Care | Payer: Medicare HMO | Attending: Emergency Medicine | Admitting: Emergency Medicine

## 2022-04-12 ENCOUNTER — Other Ambulatory Visit (HOSPITAL_BASED_OUTPATIENT_CLINIC_OR_DEPARTMENT_OTHER): Payer: Self-pay

## 2022-04-12 DIAGNOSIS — K921 Melena: Secondary | ICD-10-CM | POA: Diagnosis not present

## 2022-04-12 DIAGNOSIS — K529 Noninfective gastroenteritis and colitis, unspecified: Secondary | ICD-10-CM | POA: Diagnosis not present

## 2022-04-12 DIAGNOSIS — R1031 Right lower quadrant pain: Secondary | ICD-10-CM | POA: Diagnosis present

## 2022-04-12 DIAGNOSIS — Z79899 Other long term (current) drug therapy: Secondary | ICD-10-CM | POA: Insufficient documentation

## 2022-04-12 DIAGNOSIS — I10 Essential (primary) hypertension: Secondary | ICD-10-CM | POA: Diagnosis not present

## 2022-04-12 LAB — COMPREHENSIVE METABOLIC PANEL
ALT: 19 U/L (ref 0–44)
AST: 18 U/L (ref 15–41)
Albumin: 4.2 g/dL (ref 3.5–5.0)
Alkaline Phosphatase: 79 U/L (ref 38–126)
Anion gap: 10 (ref 5–15)
BUN: 18 mg/dL (ref 8–23)
CO2: 25 mmol/L (ref 22–32)
Calcium: 9.1 mg/dL (ref 8.9–10.3)
Chloride: 104 mmol/L (ref 98–111)
Creatinine, Ser: 0.68 mg/dL (ref 0.44–1.00)
GFR, Estimated: >60 mL/min (ref >60)
Glucose, Bld: 94 mg/dL (ref 70–99)
Potassium: 3.5 mmol/L (ref 3.5–5.1)
Sodium: 139 mmol/L (ref 135–145)
Total Bilirubin: 0.7 mg/dL (ref 0.3–1.2)
Total Protein: 6.8 g/dL (ref 6.5–8.1)

## 2022-04-12 LAB — CBC WITH DIFFERENTIAL/PLATELET
Abs Immature Granulocytes: 0.02 10*3/uL (ref 0.00–0.07)
Basophils Absolute: 0 10*3/uL (ref 0.0–0.1)
Basophils Relative: 1 %
Eosinophils Absolute: 0.1 10*3/uL (ref 0.0–0.5)
Eosinophils Relative: 2 %
HCT: 41.5 % (ref 36.0–46.0)
Hemoglobin: 14.1 g/dL (ref 12.0–15.0)
Immature Granulocytes: 0 %
Lymphocytes Relative: 33 %
Lymphs Abs: 2.9 10*3/uL (ref 0.7–4.0)
MCH: 29.9 pg (ref 26.0–34.0)
MCHC: 34 g/dL (ref 30.0–36.0)
MCV: 88.1 fL (ref 80.0–100.0)
Monocytes Absolute: 0.5 10*3/uL (ref 0.1–1.0)
Monocytes Relative: 6 %
Neutro Abs: 5.2 10*3/uL (ref 1.7–7.7)
Neutrophils Relative %: 58 %
Platelets: 314 10*3/uL (ref 150–400)
RBC: 4.71 MIL/uL (ref 3.87–5.11)
RDW: 13.1 % (ref 11.5–15.5)
WBC: 8.8 10*3/uL (ref 4.0–10.5)
nRBC: 0 % (ref 0.0–0.2)

## 2022-04-12 MED ORDER — AMOXICILLIN-POT CLAVULANATE 875-125 MG PO TABS
1.0000 | ORAL_TABLET | Freq: Once | ORAL | Status: AC
  Administered 2022-04-12: 1 via ORAL
  Filled 2022-04-12: qty 1

## 2022-04-12 MED ORDER — AMOXICILLIN-POT CLAVULANATE 875-125 MG PO TABS
1.0000 | ORAL_TABLET | Freq: Two times a day (BID) | ORAL | 0 refills | Status: AC
  Filled 2022-04-12: qty 14, 7d supply, fill #0

## 2022-04-12 MED ORDER — SODIUM CHLORIDE 0.9 % IV BOLUS
1000.0000 mL | Freq: Once | INTRAVENOUS | Status: AC
  Administered 2022-04-12: 1000 mL via INTRAVENOUS

## 2022-04-12 MED ORDER — METRONIDAZOLE 500 MG PO TABS
500.0000 mg | ORAL_TABLET | Freq: Once | ORAL | Status: DC
  Filled 2022-04-12: qty 1

## 2022-04-12 NOTE — Telephone Encounter (Signed)
Patient called this morning stating that she has been having very painful diarrhea.  Last night she was also experiencing nausea, shivering, sweats that went on for about 2 hours.  Her stools have been mucusy and bloody and she passed a small blood clot.  She says she is having intermittent bouts of pain still this morning.  She wants to know what she should do.  Please call patient and advise.  Thank you.

## 2022-04-12 NOTE — ED Notes (Signed)
Reviewed AVS/discharge instruction with patient. Time allotted for and all questions answered. Patient is agreeable for d/c and escorted to ed exit by staff.  

## 2022-04-12 NOTE — Telephone Encounter (Signed)
The pt was last seen for colon on 06/2021 with Dr Fuller Plan and 05/2017 for office visit with Ellouise Newer for hx of colitis, diverticulitis, abd pain.  She states that last night she had severe 10/10 generalized abd pain with bloody, mucous stools as well as nausea and chills for about 2 hours. She is unsure if she has/had a fever. She is still having bloody stools and passing some clots with off/on abd pain.  She states she has been hospitalized twice in the past with colitis.  She is not on any medications for colitis at this time.  Due to the severity of the pain and chills as well as rectal bleeding and passing clots I have advised her to seek care at the ED.  I recommended the [place] location.  She has agreed.  I will also send to Dr Fuller Plan for review.

## 2022-04-12 NOTE — ED Triage Notes (Signed)
Pt has IBS, today presents with abdominal pain ,shaky, sweaty/hands felt numb (was laying on the bathroom floor so maybe that's why her hands were numb), continuous diarrhea since 7pm last night. Pt had small bright red blood clot in stool, blood when wipes.

## 2022-04-12 NOTE — Telephone Encounter (Signed)
Agree with prompt ED evaluation.

## 2022-04-12 NOTE — Discharge Instructions (Signed)
We saw in the ER for bloody mucus and diarrhea. Our suspicion is that most likely you are having colitis.  You are already taking amoxicillin, we are adding Flagyl to cover any intra-abdominal bacteria.  Please follow-up with your GI doctors as planned.  Please return to the emergency room if you start having worsening bleeding, severe pain.

## 2022-04-12 NOTE — ED Provider Notes (Signed)
[facility] EMERGENCY DEPT Provider Note   CSN: [id number] Arrival date & time: 04/12/22  1117     History  Chief Complaint  Patient presents with   Abdominal Pain    [name] is a 66 y.o. female.  HPI    66 year old female comes in with chief complaint of abdominal pain. Patient has history of hypertension, diverticulitis and colitis along with IBS.  Patient started having diarrhea last night.  She has had bloody mucus drainage and also on 1 occasion frank hematochezia.  Patient is also having abdominal pain that is in the lower quadrants.  She recently had UTI for which she was on Augmentin and just finished that course yesterday.  Review of system is negative for any fevers or chills.  Patient is not on any blood thinners.  She has history of recurrent UTI and was on Cipro 3 months ago.  No history of C. difficile colitis.   Home Medications Prior to Admission medications   Medication Sig Start Date End Date Taking? Authorizing Provider  amoxicillin-clavulanate (AUGMENTIN) 875-125 MG tablet Take 1 tablet by mouth every 12 (twelve) hours. 04/12/22  Yes Varney Biles, MD  fluconazole (DIFLUCAN) 150 MG tablet Take 150 mg by mouth every 3 (three) days. 04/06/22  Yes [provider]  fluticasone (FLONASE) 50 MCG/ACT nasal spray Place 2 sprays into the nose daily as needed for allergies.    Yes [provider]  irbesartan-hydrochlorothiazide (AVALIDE) 300-12.5 MG tablet Take 1 tablet by mouth daily. 04/13/21  Yes [provider]  levothyroxine (SYNTHROID, LEVOTHROID) 175 MCG tablet Take 175 mcg by mouth at bedtime.   Yes [provider]  LORazepam (ATIVAN) 0.5 MG tablet Take 0.5 mg by mouth 2 (two) times daily as needed for anxiety.    Yes [provider]  metroNIDAZOLE (METROCREAM) 0.75 % cream Apply 1 application topically 2 (two) times daily.    Yes [provider]  omeprazole (PRILOSEC) 20 MG capsule Take  20 mg by mouth daily. 04/20/21  Yes [provider]  Probiotic Product (PROBIOTIC DAILY PO) Garden of Life - Take one daily   Yes [provider]  Vitamin D, Cholecalciferol, 25 MCG (1000 UT) TABS Take 1 tablet by mouth daily.   Yes [provider]  zolpidem (AMBIEN) 5 MG tablet Take 2.5 mg by mouth at bedtime as needed for sleep.   Yes [provider]  valACYclovir (VALTREX) 500 MG tablet Take 1,000 mg by mouth 2 (two) times daily as needed (fever blisters).    [provider]      Allergies    Lisinopril, Cephalexin, Ciprofloxacin, Macrobid [nitrofurantoin macrocrystal], Zithromax [azithromycin], and Tramadol    Review of Systems   Review of Systems  All other systems reviewed and are negative.   Physical Exam Updated Vital Signs BP (!) 141/58   Pulse 73   Temp 98.1 F (36.7 C)   Resp 18   SpO2 97%  Physical Exam Vitals and nursing note reviewed.  Constitutional:      Appearance: She is well-developed.  HENT:     Head: Atraumatic.  Cardiovascular:     Rate and Rhythm: Normal rate.  Pulmonary:     Effort: Pulmonary effort is normal.  Abdominal:     Palpations: Abdomen is soft.     Tenderness: There is abdominal tenderness in the right lower quadrant, suprapubic area and left lower quadrant. There is no guarding or rebound.  Musculoskeletal:     Cervical back: Normal  range of motion and neck supple.  Skin:    General: Skin is warm and dry.  Neurological:     Mental Status: She is alert and oriented to person, place, and time.     ED Results / Procedures / Treatments   Labs (all labs ordered are listed, but only abnormal results are displayed) Labs Reviewed  C DIFFICILE QUICK SCREEN W PCR REFLEX    COMPREHENSIVE METABOLIC PANEL  CBC WITH DIFFERENTIAL/PLATELET    EKG None  Radiology No results found.  Procedures Procedures    Medications Ordered in ED Medications  amoxicillin-clavulanate (AUGMENTIN) 875-125 MG  per tablet 1 tablet (has no administration in time range)  sodium chloride 0.9 % bolus 1,000 mL (0 mLs Intravenous Stopped 04/12/22 1615)    ED Course/ Medical Decision Making/ A&P                           Medical Decision Making Amount and/or Complexity of Data Reviewed Labs: ordered.  Risk Prescription drug management.   This patient presents to the ED with chief complaint(s) of bloody stool, bloody mucousy discharge per rectum with pertinent past medical history of IBS, diverticulosis and previous history of colitis.  Patient just finished a dose of Augmentin yesterday which further complicates the presenting complaint. The complaint involves an extensive differential diagnosis and also carries with it a high risk of complications and morbidity.    The differential diagnosis includes : Acute colitis, diverticulitis, diverticular bleed, ischemic colitis, inflammatory colitis, C. difficile colitis.  Patient has overall reassuring abdominal exam.  No peritonitis.  The initial plan is to get basic labs, CBC and BMP.  If patient's lab results are reassuring then we will reassess her and consider not getting CT scan and discussing utility of continuing antibiotic prescription for 5 days. Patient's pain is in relative control now.   Additional history obtained: Records reviewed Primary Care Documents and previous colonoscopy, that revealed diffuse diverticulosis  Independent labs interpretation:  The following labs were independently interpreted: CBC that appears normal, metabolic profile is normal.  Hemoglobin at baseline.  Treatment and Reassessment: Patient reassessed after the CBC results.  Discussed with her that the CT scan has low utility in the setting of bloody stool, as it most likely will show colitis.  We can discuss plan for colitis without the CT scan, given that she is nontoxic.  Patient agreed on Augmentin prescription continuation for the next few days.  I had ordered C.  difficile studies, however patient was not able to defecate while in the ER.  Return precautions for worsening diarrhea, worsening bleeding, worsening pain and increased abdominal pain discussed.   Consideration for admission or further workup: Considered CT abdomen and pelvis, it was not ordered given reassuring lab findings, reassuring serial abdominal exam and patient being reliable.  Final Clinical Impression(s) / ED Diagnoses Final diagnoses:  Colitis  Hematochezia    Rx / DC Orders ED Discharge Orders          Ordered    amoxicillin-clavulanate (AUGMENTIN) 875-125 MG tablet  Every 12 hours        04/12/22 1619              Varney Biles, MD 04/13/22 [phone]

## 2022-05-16 DIAGNOSIS — G4700 Insomnia, unspecified: Secondary | ICD-10-CM | POA: Diagnosis not present

## 2022-05-16 DIAGNOSIS — N3090 Cystitis, unspecified without hematuria: Secondary | ICD-10-CM | POA: Diagnosis not present

## 2022-05-16 DIAGNOSIS — R5382 Chronic fatigue, unspecified: Secondary | ICD-10-CM | POA: Diagnosis not present

## 2022-05-16 DIAGNOSIS — R350 Frequency of micturition: Secondary | ICD-10-CM | POA: Diagnosis not present

## 2022-05-21 DIAGNOSIS — Z6833 Body mass index (BMI) 33.0-33.9, adult: Secondary | ICD-10-CM | POA: Diagnosis not present

## 2022-05-21 DIAGNOSIS — Z01419 Encounter for gynecological examination (general) (routine) without abnormal findings: Secondary | ICD-10-CM | POA: Diagnosis not present

## 2022-05-24 DIAGNOSIS — L821 Other seborrheic keratosis: Secondary | ICD-10-CM | POA: Diagnosis not present

## 2022-05-24 DIAGNOSIS — L538 Other specified erythematous conditions: Secondary | ICD-10-CM | POA: Diagnosis not present

## 2022-05-24 DIAGNOSIS — D485 Neoplasm of uncertain behavior of skin: Secondary | ICD-10-CM | POA: Diagnosis not present

## 2022-05-24 DIAGNOSIS — L570 Actinic keratosis: Secondary | ICD-10-CM | POA: Diagnosis not present

## 2022-05-24 DIAGNOSIS — B079 Viral wart, unspecified: Secondary | ICD-10-CM | POA: Diagnosis not present

## 2022-05-24 DIAGNOSIS — L820 Inflamed seborrheic keratosis: Secondary | ICD-10-CM | POA: Diagnosis not present

## 2022-06-06 DIAGNOSIS — L239 Allergic contact dermatitis, unspecified cause: Secondary | ICD-10-CM | POA: Diagnosis not present

## 2022-06-06 DIAGNOSIS — L080 Pyoderma: Secondary | ICD-10-CM | POA: Diagnosis not present

## 2022-06-06 DIAGNOSIS — L538 Other specified erythematous conditions: Secondary | ICD-10-CM | POA: Diagnosis not present

## 2022-06-06 DIAGNOSIS — B078 Other viral warts: Secondary | ICD-10-CM | POA: Diagnosis not present

## 2022-06-15 DIAGNOSIS — Z96652 Presence of left artificial knee joint: Secondary | ICD-10-CM | POA: Diagnosis not present

## 2022-06-15 DIAGNOSIS — M25561 Pain in right knee: Secondary | ICD-10-CM | POA: Diagnosis not present

## 2022-06-15 DIAGNOSIS — Z96651 Presence of right artificial knee joint: Secondary | ICD-10-CM | POA: Diagnosis not present

## 2022-06-15 DIAGNOSIS — M25562 Pain in left knee: Secondary | ICD-10-CM | POA: Diagnosis not present

## 2022-06-18 DIAGNOSIS — N3020 Other chronic cystitis without hematuria: Secondary | ICD-10-CM | POA: Diagnosis not present

## 2022-07-23 DIAGNOSIS — L820 Inflamed seborrheic keratosis: Secondary | ICD-10-CM | POA: Diagnosis not present

## 2022-07-23 DIAGNOSIS — Z85828 Personal history of other malignant neoplasm of skin: Secondary | ICD-10-CM | POA: Diagnosis not present

## 2022-07-23 DIAGNOSIS — L814 Other melanin hyperpigmentation: Secondary | ICD-10-CM | POA: Diagnosis not present

## 2022-07-23 DIAGNOSIS — D485 Neoplasm of uncertain behavior of skin: Secondary | ICD-10-CM | POA: Diagnosis not present

## 2022-07-23 DIAGNOSIS — L298 Other pruritus: Secondary | ICD-10-CM | POA: Diagnosis not present

## 2022-07-23 DIAGNOSIS — D225 Melanocytic nevi of trunk: Secondary | ICD-10-CM | POA: Diagnosis not present

## 2022-07-23 DIAGNOSIS — L570 Actinic keratosis: Secondary | ICD-10-CM | POA: Diagnosis not present

## 2022-07-23 DIAGNOSIS — B079 Viral wart, unspecified: Secondary | ICD-10-CM | POA: Diagnosis not present

## 2022-07-23 DIAGNOSIS — Z08 Encounter for follow-up examination after completed treatment for malignant neoplasm: Secondary | ICD-10-CM | POA: Diagnosis not present

## 2022-07-23 DIAGNOSIS — L905 Scar conditions and fibrosis of skin: Secondary | ICD-10-CM | POA: Diagnosis not present

## 2022-07-23 DIAGNOSIS — L821 Other seborrheic keratosis: Secondary | ICD-10-CM | POA: Diagnosis not present

## 2022-07-23 DIAGNOSIS — L578 Other skin changes due to chronic exposure to nonionizing radiation: Secondary | ICD-10-CM | POA: Diagnosis not present

## 2022-07-26 DIAGNOSIS — Z6831 Body mass index (BMI) 31.0-31.9, adult: Secondary | ICD-10-CM | POA: Diagnosis not present

## 2022-07-26 DIAGNOSIS — E6609 Other obesity due to excess calories: Secondary | ICD-10-CM | POA: Diagnosis not present

## 2022-07-26 DIAGNOSIS — E890 Postprocedural hypothyroidism: Secondary | ICD-10-CM | POA: Diagnosis not present

## 2022-08-10 DIAGNOSIS — H2513 Age-related nuclear cataract, bilateral: Secondary | ICD-10-CM | POA: Diagnosis not present

## 2022-08-10 DIAGNOSIS — H04123 Dry eye syndrome of bilateral lacrimal glands: Secondary | ICD-10-CM | POA: Diagnosis not present

## 2022-08-10 DIAGNOSIS — H43812 Vitreous degeneration, left eye: Secondary | ICD-10-CM | POA: Diagnosis not present

## 2022-08-10 DIAGNOSIS — H35033 Hypertensive retinopathy, bilateral: Secondary | ICD-10-CM | POA: Diagnosis not present

## 2022-08-17 DIAGNOSIS — Z888 Allergy status to other drugs, medicaments and biological substances status: Secondary | ICD-10-CM | POA: Diagnosis not present

## 2022-08-17 DIAGNOSIS — L309 Dermatitis, unspecified: Secondary | ICD-10-CM | POA: Diagnosis not present

## 2022-08-17 DIAGNOSIS — Z8249 Family history of ischemic heart disease and other diseases of the circulatory system: Secondary | ICD-10-CM | POA: Diagnosis not present

## 2022-08-17 DIAGNOSIS — Z85828 Personal history of other malignant neoplasm of skin: Secondary | ICD-10-CM | POA: Diagnosis not present

## 2022-08-17 DIAGNOSIS — Z803 Family history of malignant neoplasm of breast: Secondary | ICD-10-CM | POA: Diagnosis not present

## 2022-08-17 DIAGNOSIS — Z881 Allergy status to other antibiotic agents status: Secondary | ICD-10-CM | POA: Diagnosis not present

## 2022-08-17 DIAGNOSIS — I10 Essential (primary) hypertension: Secondary | ICD-10-CM | POA: Diagnosis not present

## 2022-08-17 DIAGNOSIS — Z823 Family history of stroke: Secondary | ICD-10-CM | POA: Diagnosis not present

## 2022-08-17 DIAGNOSIS — K219 Gastro-esophageal reflux disease without esophagitis: Secondary | ICD-10-CM | POA: Diagnosis not present

## 2022-08-17 DIAGNOSIS — R69 Illness, unspecified: Secondary | ICD-10-CM | POA: Diagnosis not present

## 2022-08-17 DIAGNOSIS — E890 Postprocedural hypothyroidism: Secondary | ICD-10-CM | POA: Diagnosis not present

## 2022-08-17 DIAGNOSIS — E669 Obesity, unspecified: Secondary | ICD-10-CM | POA: Diagnosis not present

## 2022-08-17 DIAGNOSIS — Z008 Encounter for other general examination: Secondary | ICD-10-CM | POA: Diagnosis not present

## 2022-09-11 DIAGNOSIS — G4700 Insomnia, unspecified: Secondary | ICD-10-CM | POA: Diagnosis not present

## 2022-09-11 DIAGNOSIS — E669 Obesity, unspecified: Secondary | ICD-10-CM | POA: Diagnosis not present

## 2022-09-11 DIAGNOSIS — R69 Illness, unspecified: Secondary | ICD-10-CM | POA: Diagnosis not present

## 2022-09-11 DIAGNOSIS — I10 Essential (primary) hypertension: Secondary | ICD-10-CM | POA: Diagnosis not present

## 2022-09-11 DIAGNOSIS — E890 Postprocedural hypothyroidism: Secondary | ICD-10-CM | POA: Diagnosis not present

## 2022-10-07 DIAGNOSIS — Z6829 Body mass index (BMI) 29.0-29.9, adult: Secondary | ICD-10-CM | POA: Diagnosis not present

## 2022-10-07 DIAGNOSIS — N3000 Acute cystitis without hematuria: Secondary | ICD-10-CM | POA: Diagnosis not present

## 2022-10-07 DIAGNOSIS — R350 Frequency of micturition: Secondary | ICD-10-CM | POA: Diagnosis not present

## 2022-10-07 DIAGNOSIS — B9620 Unspecified Escherichia coli [E. coli] as the cause of diseases classified elsewhere: Secondary | ICD-10-CM | POA: Diagnosis not present

## 2022-10-26 DIAGNOSIS — Z08 Encounter for follow-up examination after completed treatment for malignant neoplasm: Secondary | ICD-10-CM | POA: Diagnosis not present

## 2022-10-26 DIAGNOSIS — L570 Actinic keratosis: Secondary | ICD-10-CM | POA: Diagnosis not present

## 2022-10-26 DIAGNOSIS — Z09 Encounter for follow-up examination after completed treatment for conditions other than malignant neoplasm: Secondary | ICD-10-CM | POA: Diagnosis not present

## 2022-10-26 DIAGNOSIS — Z85828 Personal history of other malignant neoplasm of skin: Secondary | ICD-10-CM | POA: Diagnosis not present

## 2022-11-16 DIAGNOSIS — Z6828 Body mass index (BMI) 28.0-28.9, adult: Secondary | ICD-10-CM | POA: Diagnosis not present

## 2022-11-16 DIAGNOSIS — N3000 Acute cystitis without hematuria: Secondary | ICD-10-CM | POA: Diagnosis not present

## 2022-11-16 DIAGNOSIS — R350 Frequency of micturition: Secondary | ICD-10-CM | POA: Diagnosis not present

## 2022-12-03 DIAGNOSIS — N3020 Other chronic cystitis without hematuria: Secondary | ICD-10-CM | POA: Diagnosis not present

## 2022-12-22 DIAGNOSIS — J029 Acute pharyngitis, unspecified: Secondary | ICD-10-CM | POA: Diagnosis not present

## 2022-12-22 DIAGNOSIS — H66001 Acute suppurative otitis media without spontaneous rupture of ear drum, right ear: Secondary | ICD-10-CM | POA: Diagnosis not present

## 2022-12-22 DIAGNOSIS — Z6828 Body mass index (BMI) 28.0-28.9, adult: Secondary | ICD-10-CM | POA: Diagnosis not present

## 2022-12-22 DIAGNOSIS — I10 Essential (primary) hypertension: Secondary | ICD-10-CM | POA: Diagnosis not present

## 2023-01-08 DIAGNOSIS — H2513 Age-related nuclear cataract, bilateral: Secondary | ICD-10-CM | POA: Diagnosis not present

## 2023-01-08 DIAGNOSIS — Z0100 Encounter for examination of eyes and vision without abnormal findings: Secondary | ICD-10-CM | POA: Diagnosis not present

## 2023-01-08 DIAGNOSIS — H524 Presbyopia: Secondary | ICD-10-CM | POA: Diagnosis not present

## 2023-01-25 DIAGNOSIS — E890 Postprocedural hypothyroidism: Secondary | ICD-10-CM | POA: Diagnosis not present

## 2023-01-28 DIAGNOSIS — N3020 Other chronic cystitis without hematuria: Secondary | ICD-10-CM | POA: Diagnosis not present

## 2023-02-12 ENCOUNTER — Telehealth: Payer: Self-pay | Admitting: Gastroenterology

## 2023-02-12 ENCOUNTER — Emergency Department (HOSPITAL_BASED_OUTPATIENT_CLINIC_OR_DEPARTMENT_OTHER): Payer: Medicare HMO

## 2023-02-12 ENCOUNTER — Encounter (HOSPITAL_BASED_OUTPATIENT_CLINIC_OR_DEPARTMENT_OTHER): Payer: Self-pay | Admitting: Emergency Medicine

## 2023-02-12 ENCOUNTER — Emergency Department (HOSPITAL_BASED_OUTPATIENT_CLINIC_OR_DEPARTMENT_OTHER)
Admission: EM | Admit: 2023-02-12 | Discharge: 2023-02-12 | Disposition: A | Discharge status: Home / Self Care | Payer: Medicare HMO | Attending: Emergency Medicine | Admitting: Emergency Medicine

## 2023-02-12 DIAGNOSIS — D72829 Elevated white blood cell count, unspecified: Secondary | ICD-10-CM | POA: Insufficient documentation

## 2023-02-12 DIAGNOSIS — K439 Ventral hernia without obstruction or gangrene: Secondary | ICD-10-CM

## 2023-02-12 DIAGNOSIS — K436 Other and unspecified ventral hernia with obstruction, without gangrene: Secondary | ICD-10-CM | POA: Insufficient documentation

## 2023-02-12 DIAGNOSIS — Z79899 Other long term (current) drug therapy: Secondary | ICD-10-CM | POA: Insufficient documentation

## 2023-02-12 DIAGNOSIS — I10 Essential (primary) hypertension: Secondary | ICD-10-CM | POA: Insufficient documentation

## 2023-02-12 DIAGNOSIS — E039 Hypothyroidism, unspecified: Secondary | ICD-10-CM | POA: Insufficient documentation

## 2023-02-12 DIAGNOSIS — E876 Hypokalemia: Secondary | ICD-10-CM | POA: Insufficient documentation

## 2023-02-12 DIAGNOSIS — I700 Atherosclerosis of aorta: Secondary | ICD-10-CM | POA: Diagnosis not present

## 2023-02-12 DIAGNOSIS — R1032 Left lower quadrant pain: Secondary | ICD-10-CM | POA: Diagnosis not present

## 2023-02-12 HISTORY — DX: Urinary tract infection, site not specified: N39.0

## 2023-02-12 LAB — CBC
HCT: 41.7 % (ref 36.0–46.0)
Hemoglobin: 14.1 g/dL (ref 12.0–15.0)
MCH: 30.3 pg (ref 26.0–34.0)
MCHC: 33.8 g/dL (ref 30.0–36.0)
MCV: 89.7 fL (ref 80.0–100.0)
Platelets: 261 10*3/uL (ref 150–400)
RBC: 4.65 MIL/uL (ref 3.87–5.11)
RDW: 12.9 % (ref 11.5–15.5)
WBC: 6.8 10*3/uL (ref 4.0–10.5)
nRBC: 0 % (ref 0.0–0.2)

## 2023-02-12 LAB — URINALYSIS, MICROSCOPIC (REFLEX)

## 2023-02-12 LAB — COMPREHENSIVE METABOLIC PANEL
ALT: 18 U/L (ref 0–44)
AST: 21 U/L (ref 15–41)
Albumin: 3.8 g/dL (ref 3.5–5.0)
Alkaline Phosphatase: 65 U/L (ref 38–126)
Anion gap: 8 (ref 5–15)
BUN: 21 mg/dL (ref 8–23)
CO2: 25 mmol/L (ref 22–32)
Calcium: 8.7 mg/dL — ABNORMAL LOW (ref 8.9–10.3)
Chloride: 102 mmol/L (ref 98–111)
Creatinine, Ser: 0.73 mg/dL (ref 0.44–1.00)
GFR, Estimated: >60 mL/min (ref >60)
Glucose, Bld: 95 mg/dL (ref 70–99)
Potassium: 3.2 mmol/L — ABNORMAL LOW (ref 3.5–5.1)
Sodium: 135 mmol/L (ref 135–145)
Total Bilirubin: 0.8 mg/dL (ref 0.3–1.2)
Total Protein: 7.2 g/dL (ref 6.5–8.1)

## 2023-02-12 LAB — URINALYSIS, ROUTINE W REFLEX MICROSCOPIC
Bilirubin Urine: NEGATIVE
Glucose, UA: NEGATIVE mg/dL
Ketones, ur: NEGATIVE mg/dL
Nitrite: POSITIVE — AB
Protein, ur: NEGATIVE mg/dL
Specific Gravity, Urine: 1.025 (ref 1.005–1.030)
pH: 6.5 (ref 5.0–8.0)

## 2023-02-12 LAB — LIPASE, BLOOD: Lipase: 36 U/L (ref 11–51)

## 2023-02-12 MED ORDER — IOHEXOL 300 MG/ML  SOLN
100.0000 mL | Freq: Once | INTRAMUSCULAR | Status: AC | PRN
  Administered 2023-02-12: 100 mL via INTRAVENOUS

## 2023-02-12 NOTE — ED Triage Notes (Signed)
Pt reports "knot" and pain to [facility] since this morning; diarrhea yesterday

## 2023-02-12 NOTE — Discharge Instructions (Signed)
Follow these instructions at home: Activity Avoid straining. Do not lift anything that is heavier than 10 lb (4.5 kg), or the limit that you are told, until your health care provider says that it is safe. When lifting heavy objects, lift with your leg muscles, not your back muscles. Contact a health care provider if: You develop new pain, swelling, or redness around your hernia. You have signs of constipation, such as: Fewer bowel movements in a week than normal. Difficulty having a bowel movement. Stools that are dry, hard, or larger than normal. Get help right away if: You have a fever or chills. You have abdominal pain that gets worse. You feel nauseous or you vomit. You cannot push the hernia back in place by very gently pressing on it while lying down. Do not try to force the bulge back in if it will not go in easily. The hernia: Changes in shape, size, or color. Feels hard or tender. These symptoms may represent a serious problem that is an emergency. Do not wait to see if the symptoms will go away. Get medical help right away. Call your local emergency services (911 in the [place] Do not drive yourself to the hospital.

## 2023-02-12 NOTE — ED Provider Notes (Signed)
[place] [facility] Provider Note   CSN: [id number] Arrival date & time: 02/12/23  1132     History  Chief Complaint  Patient presents with   Abdominal Pain    [name] is a 67 y.o. female who  has a past medical history of Acute diverticulitis (09/10/2012), Anemia, Anxiety, Carpal tunnel syndrome, Colitis, Diverticulosis, Family history of adverse reaction to anesthesia, Frequent UTI, GERD (gastroesophageal reflux disease), Goiter, Hip bursitis, Hypertension, Hypothyroidism, Migraines, Multiple thyroid nodules, Osteoarthritis, Osteopenia, Squamous cell carcinoma (03/2017), and Tubular adenoma of colon (04/2011). She presents w cc left lower quadrant abdominal pain. Started while she was vacuuming today.  She had onset beginning this morning, progressively worsening, hurts worse with movement bending or coughing.  History of recurrent episodes of colitis.  No previous history of diverticulitis.  She denies diarrhea, constipation, vomiting, fevers.  She has no chest pain or shortness of breath.  She has no previous abdominal surgeries.   Abdominal Pain      Home Medications Prior to Admission medications   Medication Sig Start Date End Date Taking? Authorizing Provider  amoxicillin-clavulanate (AUGMENTIN) 875-125 MG tablet Take 1 tablet by mouth every 12 (twelve) hours. 04/12/22   Derwood Kaplan, MD  fluconazole (DIFLUCAN) 150 MG tablet Take 150 mg by mouth every 3 (three) days. 04/06/22   [provider]  fluticasone (FLONASE) 50 MCG/ACT nasal spray Place 2 sprays into the nose daily as needed for allergies.     [provider]  irbesartan-hydrochlorothiazide (AVALIDE) 300-12.5 MG tablet Take 1 tablet by mouth daily. 04/13/21   [provider]  levothyroxine (SYNTHROID, LEVOTHROID) 175 MCG tablet Take 175 mcg by mouth at bedtime.    [provider]  LORazepam (ATIVAN) 0.5 MG tablet Take 0.5 mg by mouth 2 (two)  times daily as needed for anxiety.     [provider]  metroNIDAZOLE (METROCREAM) 0.75 % cream Apply 1 application topically 2 (two) times daily.     [provider]  omeprazole (PRILOSEC) 20 MG capsule Take 20 mg by mouth daily. 04/20/21   [provider]  Probiotic Product (PROBIOTIC DAILY PO) Garden of Life - Take one daily    [provider]  valACYclovir (VALTREX) 500 MG tablet Take 1,000 mg by mouth 2 (two) times daily as needed (fever blisters).    [provider]  Vitamin D, Cholecalciferol, 25 MCG (1000 UT) TABS Take 1 tablet by mouth daily.    [provider]  zolpidem (AMBIEN) 5 MG tablet Take 2.5 mg by mouth at bedtime as needed for sleep.    [provider]      Allergies    Lisinopril, Cephalexin, Ciprofloxacin, Macrobid [nitrofurantoin macrocrystal], Zithromax [azithromycin], and Tramadol    Review of Systems   Review of Systems  Gastrointestinal:  Positive for abdominal pain.    Physical Exam Updated Vital Signs  Physical Exam BP 125/74   Pulse 71   Temp 98.6 F (37 C)   Resp 17   Ht 5' 6.5" (1.689 m)   Wt 84.4 kg   SpO2 97%   BMI 29.57 kg/m   General Appearance:    Alert, cooperative, no distress, appears stated age  Head:    Normocephalic, without obvious abnormality, atraumatic  Eyes:    PERRL, conjunctiva/corneas clear, EOM's intact, fundi    benign, both eyes  Ears:    Normal TM's and external ear canals, both ears  Nose:   Nares normal, septum  midline, mucosa normal, no drainage    or sinus tenderness  Throat:   Lips, mucosa, and tongue normal; teeth and gums normal  Neck:   Supple, symmetrical, trachea midline, no adenopathy;    thyroid:  no enlargement/tenderness/nodules; no carotid   bruit or JVD  Back:     Symmetric, no curvature, ROM normal, no CVA tenderness  Lungs:     Clear to auscultation bilaterally, respirations unlabored  Chest Wall:    No tenderness or deformity   Heart:     Regular rate and rhythm, S1 and S2 normal, no murmur, rub   or gallop     Abdomen:     Soft, no distention, tenderness and involuntary guarding     present in the left lower quadrant and lower abdomen.  Genitalia:    Normal female without lesion, discharge or tenderness  Rectal:    Normal tone, normal prostate, no masses or tenderness;   guaiac negative stool  Extremities:   Extremities normal, atraumatic, no cyanosis or edema  Pulses:   2+ and symmetric all extremities  Skin:   Skin color, texture, turgor normal, no rashes or lesions  Lymph nodes:   Cervical, supraclavicular, and axillary nodes normal  Neurologic:   CNII-XII intact, normal strength, sensation and reflexes    throughout    ED Results / Procedures / Treatments   Labs (all labs ordered are listed, but only abnormal results are displayed) Labs Reviewed  COMPREHENSIVE METABOLIC PANEL - Abnormal; Notable for the following components:      Result Value   Potassium 3.2 (*)    Calcium 8.7 (*)    All other components within normal limits  URINALYSIS, ROUTINE W REFLEX MICROSCOPIC - Abnormal; Notable for the following components:   APPearance HAZY (*)    Hgb urine dipstick TRACE (*)    Nitrite POSITIVE (*)    Leukocytes,Ua SMALL (*)    All other components within normal limits  URINALYSIS, MICROSCOPIC (REFLEX) - Abnormal; Notable for the following components:   Bacteria, UA MANY (*)    All other components within normal limits  LIPASE, BLOOD  CBC    EKG None  Radiology CT ABDOMEN PELVIS W CONTRAST  Result Date: 02/12/2023 CLINICAL DATA:  Left lower quadrant abdominal pain EXAM: CT ABDOMEN AND PELVIS WITH CONTRAST TECHNIQUE: Multidetector CT imaging of the abdomen and pelvis was performed using the standard protocol following bolus administration of intravenous contrast. RADIATION DOSE REDUCTION: This exam was performed according to the departmental dose-optimization program which includes automated exposure control,  adjustment of the mA and/or kV according to patient size and/or use of iterative reconstruction technique. CONTRAST:  OMNIPAQUE IOHEXOL 300 MG/ML  SOLN COMPARISON:  05/16/2017 FINDINGS: Lower chest: Mild cardiomegaly. Hepatobiliary: Unremarkable Pancreas: Unremarkable Spleen: Unremarkable Adrenals/Urinary Tract: Unremarkable Stomach/Bowel: Descending and sigmoid colon diverticulosis without findings of active diverticulitis. Vascular/Lymphatic: Atherosclerosis is present, including aortoiliac atherosclerotic disease. Reproductive: Uterus absent.  Adnexa unremarkable. Other: No supplemental non-categorized findings. Musculoskeletal: Left lower quadrant lateral abdominal wall hernia contains adipose tissue measuring 5.8 by 1.5 by 4.7 cm, with internal stranding indicating some inflammation. There is also abnormal edema in the adjacent left lower quadrant omentum possibly related to this hernia. Adjacent loops of small bowel appear normal. Mild left foraminal impingement at L5-S1 due to facet spurring. IMPRESSION: 1. Left lower quadrant lateral abdominal wall hernia containing adipose tissue measuring 5.8 by 1.5 by 4.7 cm, with internal stranding indicating some inflammation. There is also abnormal edema in the adjacent left  lower quadrant omentum probably related to this hernia. 2. Descending and sigmoid colon diverticulosis without findings of active diverticulitis. 3. Mild cardiomegaly. 4. Aortic atherosclerosis. Aortic Atherosclerosis (ICD10-I70.0). Electronically Signed   By: Gaylyn Rong M.D.   On: 02/12/2023 15:17    Procedures Procedures    Medications Ordered in ED Medications  iohexol (OMNIPAQUE) 300 MG/ML solution 100 mL (100 mLs Intravenous Contrast Given 02/12/23 1436)    ED Course/ Medical Decision Making/ A&P Clinical Course as of 02/12/23 2139  Tue Feb 12, 2023  1451 Nitrite(!): POSITIVE [AH]  1451 Glori Luis): SMALL [AH]  1451 Bacteria, UA(!): MANY [AH]  1606 I  reevaluated the patient. She no longer has a bulge that she can feel and has no pain to palpation or at rest. I suspect she spontaneously reduced while lying in bed. [AH]    Clinical Course User Index [AH] Arthor Captain, PA-C                             Medical Decision Making Amount and/or Complexity of Data Reviewed Labs: ordered.   This patient presents to the ED with chief complaint(s) of LLQ abdominal pain  with pertinent past medical history of colitis which further complicates the presenting complaint. The complaint involves an extensive differential diagnosis and treatment options and also carries with it a high risk of complications and morbidity.    The differential diagnosis includes The differential diagnosis for generalized abdominal pain includes, but is not limited to AAA, gastroenteritis, appendicitis, Bowel obstruction, Bowel perforation. Gastroparesis, DKA, Hernia, Inflammatory bowel disease, mesenteric ischemia, pancreatitis, peritonitis SBP, volvulus.    The initial plan is to order labs and imaging,.  I am pain medication at this time    Additional history obtained: Additional history obtained from spouse Records reviewed  EMR  Reassessment and review (also see workup area): Lab Tests: I Ordered, and personally interpreted labs.  The pertinent results include:   Patient with bacteriuria.  She is asymptomatic and does not require treatment. CBC with elevated white blood cell count.  CMP with mild hypokalemia of insignificant value.  Imaging Studies: I ordered and independently visualized and interpreted the following imaging CT scan of the abdomen and pelvis   which showed left lower quadrant fat-containing hernia with inflammatory stranding no evidence of strangulation or bowel obstruction The interpretation of the imaging was limited to assessing for emergent pathology, for which purpose it was ordered.  Consultations Obtained: I requested consultation with  the surgery consultant , I discussed the case with PA Barnetta Chapel who reviewed images with attending physician. , and discussed  findings as well as pertinent plan - they recommend: No interventions at this time, pain control and close follow-up in the clinic     Reevaluation of the patient after these medicines showed that the patient    improved as discussed in ED course patient apparently spontaneously reduced while lying in weight during evaluation.  She has no pain at this time  Social Determinants of Health: SDOH Screenings   Tobacco Use: Low Risk  (02/12/2023)     Cardiac Monitoring: The patient was maintained on a cardiac monitor.  I personally viewed and interpreted the cardiac monitor which showed an underlying rhythm of:  sinus rhythm  Complexity of problems addressed: Patient's presentation is most consistent with  acute complicated illness/injury requiring diagnostic workup During patient's assessment  Disposition: After consideration of the diagnostic results and the patient's response to treatment,  I feel that the patent would benefit from discharge with close follow-up with surgery .         Final Clinical Impression(s) / ED Diagnoses Final diagnoses:  Spigelian hernia    Rx / DC Orders ED Discharge Orders     None         Arthor Captain, PA-C 02/12/23 2140    Rondel Baton, MD 02/13/23 [phone]

## 2023-02-12 NOTE — Telephone Encounter (Signed)
Patient called requesting to speak with a nurse about having a lot of abdominal pain. Seeking advise.

## 2023-02-13 NOTE — Telephone Encounter (Signed)
The pt states that she was seen in the ED last night and does not have any concerns at this time.  She was advised to make a follow up appt since she has not been seen in several years. The pt has been advised of the information and verbalized understanding.

## 2023-02-22 DIAGNOSIS — G4700 Insomnia, unspecified: Secondary | ICD-10-CM | POA: Diagnosis not present

## 2023-02-22 DIAGNOSIS — J309 Allergic rhinitis, unspecified: Secondary | ICD-10-CM | POA: Diagnosis not present

## 2023-02-22 DIAGNOSIS — K5900 Constipation, unspecified: Secondary | ICD-10-CM | POA: Diagnosis not present

## 2023-02-22 DIAGNOSIS — I10 Essential (primary) hypertension: Secondary | ICD-10-CM | POA: Diagnosis not present

## 2023-02-22 DIAGNOSIS — F411 Generalized anxiety disorder: Secondary | ICD-10-CM | POA: Diagnosis not present

## 2023-02-22 DIAGNOSIS — H269 Unspecified cataract: Secondary | ICD-10-CM | POA: Diagnosis not present

## 2023-02-22 DIAGNOSIS — M1990 Unspecified osteoarthritis, unspecified site: Secondary | ICD-10-CM | POA: Diagnosis not present

## 2023-02-22 DIAGNOSIS — K219 Gastro-esophageal reflux disease without esophagitis: Secondary | ICD-10-CM | POA: Diagnosis not present

## 2023-02-22 DIAGNOSIS — E890 Postprocedural hypothyroidism: Secondary | ICD-10-CM | POA: Diagnosis not present

## 2023-02-22 DIAGNOSIS — R32 Unspecified urinary incontinence: Secondary | ICD-10-CM | POA: Diagnosis not present

## 2023-03-01 DIAGNOSIS — R1011 Right upper quadrant pain: Secondary | ICD-10-CM | POA: Diagnosis not present

## 2023-03-04 ENCOUNTER — Other Ambulatory Visit: Payer: Self-pay | Admitting: Surgery

## 2023-03-04 DIAGNOSIS — R1011 Right upper quadrant pain: Secondary | ICD-10-CM

## 2023-03-27 DIAGNOSIS — I788 Other diseases of capillaries: Secondary | ICD-10-CM | POA: Diagnosis not present

## 2023-03-27 DIAGNOSIS — D485 Neoplasm of uncertain behavior of skin: Secondary | ICD-10-CM | POA: Diagnosis not present

## 2023-03-27 DIAGNOSIS — L738 Other specified follicular disorders: Secondary | ICD-10-CM | POA: Diagnosis not present

## 2023-03-27 DIAGNOSIS — Z09 Encounter for follow-up examination after completed treatment for conditions other than malignant neoplasm: Secondary | ICD-10-CM | POA: Diagnosis not present

## 2023-03-28 ENCOUNTER — Other Ambulatory Visit: Payer: Medicare HMO

## 2023-04-25 DIAGNOSIS — R350 Frequency of micturition: Secondary | ICD-10-CM | POA: Diagnosis not present

## 2023-04-25 DIAGNOSIS — Z6829 Body mass index (BMI) 29.0-29.9, adult: Secondary | ICD-10-CM | POA: Diagnosis not present

## 2023-04-25 DIAGNOSIS — N3000 Acute cystitis without hematuria: Secondary | ICD-10-CM | POA: Diagnosis not present

## 2023-04-26 DIAGNOSIS — L728 Other follicular cysts of the skin and subcutaneous tissue: Secondary | ICD-10-CM | POA: Diagnosis not present

## 2023-04-26 DIAGNOSIS — L080 Pyoderma: Secondary | ICD-10-CM | POA: Diagnosis not present

## 2023-05-08 DIAGNOSIS — L080 Pyoderma: Secondary | ICD-10-CM | POA: Diagnosis not present

## 2023-05-08 DIAGNOSIS — L728 Other follicular cysts of the skin and subcutaneous tissue: Secondary | ICD-10-CM | POA: Diagnosis not present

## 2023-06-03 DIAGNOSIS — Z6830 Body mass index (BMI) 30.0-30.9, adult: Secondary | ICD-10-CM | POA: Diagnosis not present

## 2023-06-03 DIAGNOSIS — Z01419 Encounter for gynecological examination (general) (routine) without abnormal findings: Secondary | ICD-10-CM | POA: Diagnosis not present

## 2023-06-04 ENCOUNTER — Other Ambulatory Visit: Payer: Self-pay | Admitting: Obstetrics and Gynecology

## 2023-06-04 DIAGNOSIS — Z1231 Encounter for screening mammogram for malignant neoplasm of breast: Secondary | ICD-10-CM

## 2023-06-12 ENCOUNTER — Ambulatory Visit: Payer: Self-pay | Admitting: Surgery

## 2023-06-12 DIAGNOSIS — K439 Ventral hernia without obstruction or gangrene: Secondary | ICD-10-CM | POA: Diagnosis not present

## 2023-06-12 NOTE — H&P (Signed)
History of Present Illness: [name] is a 67 y.o. female who is seen today for follow up of a Spigelian hernia. Since her first visit with me in June, she has had more frequent symptoms from the hernia. She had a few isolated episodes of severe pain, but now has almost constant discomfort over the hernia. She does not have obstructive symptoms.    Her only prior abdominal surgery is a hysterectomy.         Review of Systems: A complete review of systems was obtained from the patient.  I have reviewed this information and discussed as appropriate with the patient.  See HPI as well for other ROS.       Medical History: Past Medical History Past Medical History: Diagnosis Date  Anxiety    Arthritis    GERD (gastroesophageal reflux disease)    Hypertension    Thyroid disease         Problem List There is no problem list on file for this patient.     Past Surgical History Past Surgical History: Procedure Laterality Date  SINUS SURGERY   1994   x 2  HYSTERECTOMY   1995  THYROID SURGERY   2014  JOINT REPLACEMENT Bilateral     2010, 2019 - Partial      Allergies Allergies Allergen Reactions  Cephalexin Hives  Lisinopril Itching and Palpitations  Nitrofurantoin Monohyd/M-Cryst Other (See Comments)     Nausea, abdominal cramps, diarrhea, lightheadedness.  Ciprofloxacin Other (See Comments)     Gi upset  Fluoxetine Hcl Other (See Comments)  Nitrofurantoin Macrocrystal Hives and Nausea And Vomiting     Light headed  Azithromycin Nausea, Nausea And Vomiting and Vomiting     Gi upset  Grass Pollen Other (See Comments)  House Dust Other (See Comments)  Tramadol Nausea      Medications Ordered Prior to Encounter Current Outpatient Medications on File Prior to Visit Medication Sig Dispense Refill  cholecalciferol (VITAMIN D3) 1000 unit tablet Take 1 tablet by mouth once daily      docusate (COLACE) 50 MG capsule Take 50 mg by mouth 2 (two) times daily       estradioL (ESTRACE) 0.01 % (0.1 mg/gram) vaginal cream APPLY 1 BEAD PER VAGINA EVERY OTHER DAY      fluticasone propionate (FLONASE) 50 mcg/actuation nasal spray Place 2 sprays into both nostrils once daily      irbesartan-hydroCHLOROthiazide (AVALIDE) 300-12.5 mg tablet TAKE ONE TABLET BY MOUTH DAILY 30 DAYS 90      LACTASE ORAL Take by mouth      Lactobacillus acidophilus (PROBIOTIC ORAL) Garden of Life - Take one daily      levothyroxine (SYNTHROID) 175 MCG tablet Take 1 tablet by mouth once daily      LORazepam (ATIVAN) 0.5 MG tablet TAKE 1/2 TO 1 TABLET BY MOUTH THREE TIMES PER DAY AS NEEDED FOR ANXIETY      methocarbamoL (ROBAXIN) 500 MG tablet Take 1 tablet (500 mg total) by mouth 3 (three) times daily as needed (pain/muscle spasms) for up to 15 doses 15 tablet 0  polyethylene glycol (MIRALAX) powder Take 17 g by mouth once daily Mix in 4-8ounces of fluid prior to taking.      sod chloride/pot chloride/mag (MAGNESIUM-POTASS-SODIUM SALT ORAL) Take by mouth      valACYclovir (VALTREX) 1000 MG tablet 2 TABLETS AS NEEDED FOR COLD SORE, REPEAT ONCE IN 12 HOURS ORALLY      zolpidem (AMBIEN) 5 MG tablet 0.5 TABLET  AT BEDTIME BY MOUTH TORRENT MANUFACTURER ONCE A DAY IF NEEDED 30 DAYS      phentermine (ADIPEX-P) 37.5 mg tablet Take 37.5 mg by mouth every morning before breakfast (Patient not taking: Reported on 06/12/2023)        No current facility-administered medications on file prior to visit.      Family History History reviewed. No pertinent family history.     Tobacco Use History Social History    Tobacco Use Smoking Status Never Smokeless Tobacco Never      Social History Social History    Socioeconomic History  Marital status: Married Tobacco Use  Smoking status: Never  Smokeless tobacco: Never Vaping Use  Vaping status: Never Used Substance and Sexual Activity  Alcohol use: Not Currently  Drug use: Never      Objective:     Vitals:   06/12/23  1050 BP: 137/77 Pulse: 83 Temp: 36.6 C (97.9 F) SpO2: 97% Weight: 87 kg (191 lb 12.8 oz) Height: 168.9 cm (5' 6.5") PainSc:   2   Body mass index is 30.49 kg/m.   Physical Exam Vitals reviewed.  Constitutional:      General: She is not in acute distress.    Appearance: Normal appearance.  HENT:     Head: Normocephalic and atraumatic.  Abdominal:     General: There is no distension.     Palpations: Abdomen is soft.     Comments: Subcutaneous mass on the LLQ abdominal wall, somewhat difficult to palpation, corresponding to the known ventral hernia. Mass is mildly tender and non-reducible, no overlying skin changes.  Skin:    General: Skin is warm and dry.     Coloration: Skin is not jaundiced.  Neurological:     General: No focal deficit present.     Mental Status: She is alert and oriented to person, place, and time.              Assessment and Plan:   Assessment Diagnoses and all orders for this visit:   Ventral hernia without obstruction or gangrene -     CCS Case Posting Request; Future     67 yo female with a ventral hernia, likely a Spigelian hernia based on imaging and exam. She now has very frequent symptoms and would like to proceed with repair. I recommended a laparoscopic approach as the hernia is difficult to localize externally. I reviewed the details of the surgery, the postoperative restrictions and anticipated recovery, and the benefits and risks of bleeding, infection, damage to surrounding structures, and recurrence. All questions were answered and she agrees to proceed. She will be contacted to schedule an elective surgery date.  Sophronia Simas, MD [facility] General, Hepatobiliary and Pancreatic Surgery 06/12/23 1:18 PM

## 2023-06-25 DIAGNOSIS — L309 Dermatitis, unspecified: Secondary | ICD-10-CM | POA: Diagnosis not present

## 2023-06-25 DIAGNOSIS — L905 Scar conditions and fibrosis of skin: Secondary | ICD-10-CM | POA: Diagnosis not present

## 2023-06-25 DIAGNOSIS — L02425 Furuncle of right lower limb: Secondary | ICD-10-CM | POA: Diagnosis not present

## 2023-06-27 ENCOUNTER — Ambulatory Visit
Admission: RE | Admit: 2023-06-27 | Discharge: 2023-06-27 | Disposition: A | Discharge status: Home / Self Care | Payer: Medicare HMO | Source: Ambulatory Visit | Attending: Obstetrics and Gynecology | Admitting: Obstetrics and Gynecology

## 2023-06-27 DIAGNOSIS — Z1231 Encounter for screening mammogram for malignant neoplasm of breast: Secondary | ICD-10-CM

## 2023-07-04 DIAGNOSIS — E559 Vitamin D deficiency, unspecified: Secondary | ICD-10-CM | POA: Diagnosis not present

## 2023-07-04 DIAGNOSIS — I10 Essential (primary) hypertension: Secondary | ICD-10-CM | POA: Diagnosis not present

## 2023-07-04 DIAGNOSIS — E785 Hyperlipidemia, unspecified: Secondary | ICD-10-CM | POA: Diagnosis not present

## 2023-07-26 DIAGNOSIS — D225 Melanocytic nevi of trunk: Secondary | ICD-10-CM | POA: Diagnosis not present

## 2023-07-26 DIAGNOSIS — L710 Perioral dermatitis: Secondary | ICD-10-CM | POA: Diagnosis not present

## 2023-07-26 DIAGNOSIS — D485 Neoplasm of uncertain behavior of skin: Secondary | ICD-10-CM | POA: Diagnosis not present

## 2023-07-26 DIAGNOSIS — Z08 Encounter for follow-up examination after completed treatment for malignant neoplasm: Secondary | ICD-10-CM | POA: Diagnosis not present

## 2023-07-26 DIAGNOSIS — L538 Other specified erythematous conditions: Secondary | ICD-10-CM | POA: Diagnosis not present

## 2023-07-26 DIAGNOSIS — L718 Other rosacea: Secondary | ICD-10-CM | POA: Diagnosis not present

## 2023-07-26 DIAGNOSIS — L821 Other seborrheic keratosis: Secondary | ICD-10-CM | POA: Diagnosis not present

## 2023-07-26 DIAGNOSIS — L659 Nonscarring hair loss, unspecified: Secondary | ICD-10-CM | POA: Diagnosis not present

## 2023-07-26 DIAGNOSIS — L570 Actinic keratosis: Secondary | ICD-10-CM | POA: Diagnosis not present

## 2023-07-26 DIAGNOSIS — L814 Other melanin hyperpigmentation: Secondary | ICD-10-CM | POA: Diagnosis not present

## 2023-07-26 DIAGNOSIS — Z85828 Personal history of other malignant neoplasm of skin: Secondary | ICD-10-CM | POA: Diagnosis not present

## 2023-07-26 NOTE — Progress Notes (Signed)
Surgical Instructions   Your procedure is scheduled on Monday, 08/05/2023. Report to [facility] Main Entrance "A" at 12:00 P.M., then check in with the Admitting office. Any questions or running late day of surgery: call [phone]  Questions prior to your surgery date: call [phone], Monday-Friday, 8am-4pm. If you experience any cold or flu symptoms such as cough, fever, chills, shortness of breath, etc. between now and your scheduled surgery, please notify us at the above number.     Remember:  Do not eat after midnight the night before your surgery  You may drink clear liquids until 11:00am the morning of your surgery.   Clear liquids allowed are: Water, Non-Citrus Juices (without pulp), Carbonated Beverages, Clear Tea (no milk, honey, etc.), Black Coffee Only (NO MILK, CREAM OR POWDERED CREAMER of any kind), and Gatorade.    Take these medicines the morning of surgery with A SIP OF WATER  amoxicillin-clavulanate (AUGMENTIN)  fluconazole (DIFLUCAN)  omeprazole (PRILOSEC)   May take these medicines IF NEEDED: fluticasone (FLONASE)  LORazepam (ATIVAN)  valACYclovir (VALTREX)   One week prior to surgery, STOP taking any Aspirin (unless otherwise instructed by your surgeon) Aleve, Naproxen, Ibuprofen, Motrin, Advil, Goody's, BC's, all herbal medications, fish oil, and non-prescription vitamins.                     Do NOT Smoke (Tobacco/Vaping) for 24 hours prior to your procedure.  If you use a CPAP at night, you may bring your mask/headgear for your overnight stay.   You will be asked to remove any contacts, glasses, piercing's, hearing aid's, dentures/partials prior to surgery. Please bring cases for these items if needed.    Patients discharged the day of surgery will not be allowed to drive home, and someone needs to stay with them for 24 hours.  SURGICAL WAITING ROOM VISITATION Patients may have no more than 2 support people in the waiting area - these visitors may  rotate.   Pre-op nurse will coordinate an appropriate time for 1 ADULT support person, who may not rotate, to accompany patient in pre-op.  Children under the age of 71 must have an adult with them who is not the patient and must remain in the main waiting area with an adult.  If the patient needs to stay at the hospital during part of their recovery, the visitor guidelines for inpatient rooms apply.  Please refer to the [facility] website for the visitor guidelines for any additional information.   If you received a COVID test during your pre-op visit  it is requested that you wear a mask when out in public, stay away from anyone that may not be feeling well and notify your surgeon if you develop symptoms. If you have been in contact with anyone that has tested positive in the last 10 days please notify you surgeon.      Pre-operative CHG Bathing Instructions   You can play a key role in reducing the risk of infection after surgery. Your skin needs to be as free of germs as possible. You can reduce the number of germs on your skin by washing with CHG (chlorhexidine gluconate) soap before surgery. CHG is an antiseptic soap that kills germs and continues to kill germs even after washing.   DO NOT use if you have an allergy to chlorhexidine/CHG or antibacterial soaps. If your skin becomes reddened or irritated, stop using the CHG and notify one of our RNs at [phone].  TAKE A SHOWER THE NIGHT BEFORE SURGERY AND THE DAY OF SURGERY    Please keep in mind the following:  DO NOT shave, including legs and underarms, 48 hours prior to surgery.   You may shave your face before/day of surgery.  Place clean sheets on your bed the night before surgery Use a clean washcloth (not used since being washed) for each shower. DO NOT sleep with pet's night before surgery.  CHG Shower Instructions:  Wash your face and private area with normal soap. If you choose to wash your hair, wash  first with your normal shampoo.  After you use shampoo/soap, rinse your hair and body thoroughly to remove shampoo/soap residue.  Turn the water OFF and apply half the bottle of CHG soap to a CLEAN washcloth.  Apply CHG soap ONLY FROM YOUR NECK DOWN TO YOUR TOES (washing for 3-5 minutes)  DO NOT use CHG soap on face, private areas, open wounds, or sores.  Pay special attention to the area where your surgery is being performed.  If you are having back surgery, having someone wash your back for you may be helpful. Wait 2 minutes after CHG soap is applied, then you may rinse off the CHG soap.  Pat dry with a clean towel  Put on clean pajamas    Additional instructions for the day of surgery: DO NOT APPLY any lotions, deodorants, cologne, or perfumes.   Do not wear jewelry or makeup Do not wear nail polish, gel polish, artificial nails, or any other type of covering on natural nails (fingers and toes) Do not bring valuables to the hospital. [facility] is not responsible for valuables/personal belongings. Put on clean/comfortable clothes.  Please brush your teeth.  Ask your nurse before applying any prescription medications to the skin.

## 2023-07-29 ENCOUNTER — Other Ambulatory Visit: Payer: Self-pay

## 2023-07-29 ENCOUNTER — Encounter (HOSPITAL_COMMUNITY): Payer: Self-pay

## 2023-07-29 ENCOUNTER — Encounter (HOSPITAL_COMMUNITY)
Admission: RE | Admit: 2023-07-29 | Discharge: 2023-07-29 | Disposition: A | Discharge status: Home / Self Care | Payer: Medicare HMO | Source: Ambulatory Visit | Attending: Surgery | Admitting: Surgery

## 2023-07-29 VITALS — BP 140/67 | HR 77 | Temp 98.2°F | Resp 17 | Ht 66.0 in | Wt 198.7 lb

## 2023-07-29 DIAGNOSIS — I10 Essential (primary) hypertension: Secondary | ICD-10-CM | POA: Diagnosis not present

## 2023-07-29 DIAGNOSIS — Z01810 Encounter for preprocedural cardiovascular examination: Secondary | ICD-10-CM | POA: Diagnosis not present

## 2023-07-29 DIAGNOSIS — Z01812 Encounter for preprocedural laboratory examination: Secondary | ICD-10-CM | POA: Diagnosis not present

## 2023-07-29 DIAGNOSIS — Z01818 Encounter for other preprocedural examination: Secondary | ICD-10-CM | POA: Diagnosis not present

## 2023-07-29 HISTORY — DX: Depression, unspecified: F32.A

## 2023-07-29 LAB — CBC
HCT: 43 % (ref 36.0–46.0)
Hemoglobin: 14.1 g/dL (ref 12.0–15.0)
MCH: 30.7 pg (ref 26.0–34.0)
MCHC: 32.8 g/dL (ref 30.0–36.0)
MCV: 93.7 fL (ref 80.0–100.0)
Platelets: 312 10*3/uL (ref 150–400)
RBC: 4.59 MIL/uL (ref 3.87–5.11)
RDW: 13.1 % (ref 11.5–15.5)
WBC: 9.7 10*3/uL (ref 4.0–10.5)
nRBC: 0 % (ref 0.0–0.2)

## 2023-07-29 NOTE — Progress Notes (Signed)
PCP - Dr. Laurann Montana Cardiologist - denies  PPM/ICD - denies   Chest x-ray - 05/01/12 EKG - 07/29/23 Stress Test - denies ECHO - denies Cardiac Cath - denies  Sleep Study - denies   DM- denies  Last dose of GLP1 agonist-  n/a   ASA/Blood Thinner Instructions: n/a   ERAS Protcol - yes, no drink   COVID TEST- n/a   Anesthesia review: no  Patient denies shortness of breath, fever, cough and chest pain at PAT appointment   All instructions explained to the patient, with a verbal understanding of the material. Patient agrees to go over the instructions while at home for a better understanding.  The opportunity to ask questions was provided.

## 2023-07-29 NOTE — Progress Notes (Signed)
Surgical Instructions   Your procedure is scheduled on Monday, 08/05/2023. Report to [facility] Main Entrance "A" at 12:00 P.M., then check in with the Admitting office. Any questions or running late day of surgery: call [phone]  Questions prior to your surgery date: call [phone], Monday-Friday, 8am-4pm. If you experience any cold or flu symptoms such as cough, fever, chills, shortness of breath, etc. between now and your scheduled surgery, please notify us at the above number.     Remember:  Do not eat after midnight the night before your surgery  You may drink clear liquids until 11:00am the morning of your surgery.   Clear liquids allowed are: Water, Non-Citrus Juices (without pulp), Carbonated Beverages, Clear Tea (no milk, honey, etc.), Black Coffee Only (NO MILK, CREAM OR POWDERED CREAMER of any kind), and Gatorade.    Take these medicines the morning of surgery with A SIP OF WATER  levothyroxine (SYNTHROID, LEVOTHROID)  Acetylcysteine (NAC 600 PO)   May take these medicines IF NEEDED: fluticasone (FLONASE)  LORazepam (ATIVAN)  valACYclovir (VALTREX)   One week prior to surgery, STOP taking any Aspirin (unless otherwise instructed by your surgeon) Aleve, Naproxen, Ibuprofen, Motrin, Advil, Goody's, BC's, all herbal medications, fish oil, and non-prescription vitamins.                     Do NOT Smoke (Tobacco/Vaping) for 24 hours prior to your procedure.  If you use a CPAP at night, you may bring your mask/headgear for your overnight stay.   You will be asked to remove any contacts, glasses, piercing's, hearing aid's, dentures/partials prior to surgery. Please bring cases for these items if needed.    Patients discharged the day of surgery will not be allowed to drive home, and someone needs to stay with them for 24 hours.  SURGICAL WAITING ROOM VISITATION Patients may have no more than 2 support people in the waiting area - these visitors may rotate.   Pre-op  nurse will coordinate an appropriate time for 1 ADULT support person, who may not rotate, to accompany patient in pre-op.  Children under the age of 28 must have an adult with them who is not the patient and must remain in the main waiting area with an adult.  If the patient needs to stay at the hospital during part of their recovery, the visitor guidelines for inpatient rooms apply.  Please refer to the [facility] website for the visitor guidelines for any additional information.   If you received a COVID test during your pre-op visit  it is requested that you wear a mask when out in public, stay away from anyone that may not be feeling well and notify your surgeon if you develop symptoms. If you have been in contact with anyone that has tested positive in the last 10 days please notify you surgeon.      Pre-operative CHG Bathing Instructions   You can play a key role in reducing the risk of infection after surgery. Your skin needs to be as free of germs as possible. You can reduce the number of germs on your skin by washing with CHG (chlorhexidine gluconate) soap before surgery. CHG is an antiseptic soap that kills germs and continues to kill germs even after washing.   DO NOT use if you have an allergy to chlorhexidine/CHG or antibacterial soaps. If your skin becomes reddened or irritated, stop using the CHG and notify one of our RNs at [phone].  TAKE A SHOWER THE NIGHT BEFORE SURGERY AND THE DAY OF SURGERY    Please keep in mind the following:  DO NOT shave, including legs and underarms, 48 hours prior to surgery.   You may shave your face before/day of surgery.  Place clean sheets on your bed the night before surgery Use a clean washcloth (not used since being washed) for each shower. DO NOT sleep with pet's night before surgery.  CHG Shower Instructions:  Wash your face and private area with normal soap. If you choose to wash your hair, wash first with your normal  shampoo.  After you use shampoo/soap, rinse your hair and body thoroughly to remove shampoo/soap residue.  Turn the water OFF and apply half the bottle of CHG soap to a CLEAN washcloth.  Apply CHG soap ONLY FROM YOUR NECK DOWN TO YOUR TOES (washing for 3-5 minutes)  DO NOT use CHG soap on face, private areas, open wounds, or sores.  Pay special attention to the area where your surgery is being performed.  If you are having back surgery, having someone wash your back for you may be helpful. Wait 2 minutes after CHG soap is applied, then you may rinse off the CHG soap.  Pat dry with a clean towel  Put on clean pajamas    Additional instructions for the day of surgery: DO NOT APPLY any lotions, deodorants, cologne, or perfumes.   Do not wear jewelry or makeup Do not wear nail polish, gel polish, artificial nails, or any other type of covering on natural nails (fingers and toes) Do not bring valuables to the hospital. [facility] is not responsible for valuables/personal belongings. Put on clean/comfortable clothes.  Please brush your teeth.  Ask your nurse before applying any prescription medications to the skin.

## 2023-07-29 NOTE — Progress Notes (Signed)
Per lab BMP sample was hemolyzed.  New sample will be collected on DOS.

## 2023-08-02 DIAGNOSIS — E890 Postprocedural hypothyroidism: Secondary | ICD-10-CM | POA: Diagnosis not present

## 2023-08-05 ENCOUNTER — Ambulatory Visit (HOSPITAL_BASED_OUTPATIENT_CLINIC_OR_DEPARTMENT_OTHER): Payer: Self-pay | Admitting: Anesthesiology

## 2023-08-05 ENCOUNTER — Other Ambulatory Visit: Payer: Self-pay

## 2023-08-05 ENCOUNTER — Ambulatory Visit (HOSPITAL_COMMUNITY): Payer: Self-pay | Admitting: Anesthesiology

## 2023-08-05 ENCOUNTER — Ambulatory Visit (HOSPITAL_COMMUNITY)
Admission: RE | Admit: 2023-08-05 | Discharge: 2023-08-05 | Disposition: A | Discharge status: Home / Self Care | Payer: Medicare HMO | Attending: Surgery | Admitting: Surgery

## 2023-08-05 ENCOUNTER — Encounter (HOSPITAL_COMMUNITY): Payer: Self-pay | Admitting: Surgery

## 2023-08-05 ENCOUNTER — Encounter (HOSPITAL_COMMUNITY)
Admission: RE | Disposition: A | Discharge status: Home / Self Care | Payer: Self-pay | Source: Home / Self Care | Attending: Surgery

## 2023-08-05 DIAGNOSIS — I10 Essential (primary) hypertension: Secondary | ICD-10-CM | POA: Insufficient documentation

## 2023-08-05 DIAGNOSIS — K439 Ventral hernia without obstruction or gangrene: Secondary | ICD-10-CM | POA: Diagnosis not present

## 2023-08-05 DIAGNOSIS — K432 Incisional hernia without obstruction or gangrene: Secondary | ICD-10-CM | POA: Insufficient documentation

## 2023-08-05 DIAGNOSIS — K219 Gastro-esophageal reflux disease without esophagitis: Secondary | ICD-10-CM | POA: Diagnosis not present

## 2023-08-05 DIAGNOSIS — E039 Hypothyroidism, unspecified: Secondary | ICD-10-CM | POA: Diagnosis not present

## 2023-08-05 HISTORY — PX: LAPAROSCOPY: SHX197

## 2023-08-05 HISTORY — PX: INCISIONAL HERNIA REPAIR: SHX193

## 2023-08-05 LAB — POCT I-STAT, CHEM 8
BUN: 19 mg/dL (ref 8–23)
Calcium, Ion: 1.24 mmol/L (ref 1.15–1.40)
Chloride: 103 mmol/L (ref 98–111)
Creatinine, Ser: 0.8 mg/dL (ref 0.44–1.00)
Glucose, Bld: 97 mg/dL (ref 70–99)
HCT: 41 % (ref 36.0–46.0)
Hemoglobin: 13.9 g/dL (ref 12.0–15.0)
Potassium: 3.6 mmol/L (ref 3.5–5.1)
Sodium: 139 mmol/L (ref 135–145)
TCO2: 28 mmol/L (ref 22–32)

## 2023-08-05 SURGERY — REPAIR, HERNIA, INCISIONAL
Anesthesia: General | Site: Abdomen

## 2023-08-05 MED ORDER — OXYCODONE HCL 5 MG PO TABS
ORAL_TABLET | ORAL | Status: AC
  Filled 2023-08-05: qty 1

## 2023-08-05 MED ORDER — PROPOFOL 10 MG/ML IV BOLUS
INTRAVENOUS | Status: DC | PRN
  Administered 2023-08-05: 130 mg via INTRAVENOUS

## 2023-08-05 MED ORDER — OXYCODONE HCL 5 MG PO TABS
5.0000 mg | ORAL_TABLET | Freq: Once | ORAL | Status: AC | PRN
  Administered 2023-08-05: 5 mg via ORAL

## 2023-08-05 MED ORDER — FENTANYL CITRATE (PF) 250 MCG/5ML IJ SOLN
INTRAMUSCULAR | Status: AC
  Filled 2023-08-05: qty 5

## 2023-08-05 MED ORDER — VANCOMYCIN HCL IN DEXTROSE 1-5 GM/200ML-% IV SOLN
1000.0000 mg | INTRAVENOUS | Status: AC
  Administered 2023-08-05: 1000 mg via INTRAVENOUS
  Filled 2023-08-05: qty 200

## 2023-08-05 MED ORDER — BUPIVACAINE LIPOSOME 1.3 % IJ SUSP
INTRAMUSCULAR | Status: AC
  Filled 2023-08-05: qty 20

## 2023-08-05 MED ORDER — BUPIVACAINE-EPINEPHRINE (PF) 0.25% -1:200000 IJ SOLN
INTRAMUSCULAR | Status: AC
  Filled 2023-08-05: qty 30

## 2023-08-05 MED ORDER — SUGAMMADEX SODIUM 200 MG/2ML IV SOLN
INTRAVENOUS | Status: DC | PRN
  Administered 2023-08-05: 200 mg via INTRAVENOUS

## 2023-08-05 MED ORDER — ACETAMINOPHEN 10 MG/ML IV SOLN: 1000.0000 mg | Freq: Once | INTRAVENOUS | Status: DC | PRN

## 2023-08-05 MED ORDER — FENTANYL CITRATE (PF) 100 MCG/2ML IJ SOLN: 25.0000 ug | INTRAMUSCULAR | Status: DC | PRN

## 2023-08-05 MED ORDER — LIDOCAINE 2% (20 MG/ML) 5 ML SYRINGE
INTRAMUSCULAR | Status: AC
  Filled 2023-08-05: qty 20

## 2023-08-05 MED ORDER — CELECOXIB 200 MG PO CAPS
200.0000 mg | ORAL_CAPSULE | ORAL | Status: AC
  Administered 2023-08-05: 200 mg via ORAL
  Filled 2023-08-05: qty 1

## 2023-08-05 MED ORDER — ROCURONIUM BROMIDE 10 MG/ML (PF) SYRINGE
PREFILLED_SYRINGE | INTRAVENOUS | Status: AC
  Filled 2023-08-05: qty 20

## 2023-08-05 MED ORDER — ONDANSETRON HCL 4 MG/2ML IJ SOLN
INTRAMUSCULAR | Status: AC
Start: 2023-08-05 — End: ?
  Filled 2023-08-05: qty 2

## 2023-08-05 MED ORDER — LACTATED RINGERS IV SOLN
INTRAVENOUS | Status: DC
Start: 2023-08-05 — End: 2023-08-06

## 2023-08-05 MED ORDER — HYDROMORPHONE HCL 1 MG/ML IJ SOLN
INTRAMUSCULAR | Status: AC
  Filled 2023-08-05: qty 0.5

## 2023-08-05 MED ORDER — EPHEDRINE SULFATE-NACL 50-0.9 MG/10ML-% IV SOSY
PREFILLED_SYRINGE | INTRAVENOUS | Status: DC | PRN
  Administered 2023-08-05: 15 mg via INTRAVENOUS

## 2023-08-05 MED ORDER — ONDANSETRON HCL 4 MG/2ML IJ SOLN: 4.0000 mg | Freq: Once | INTRAMUSCULAR | Status: DC | PRN

## 2023-08-05 MED ORDER — LIDOCAINE 2% (20 MG/ML) 5 ML SYRINGE
INTRAMUSCULAR | Status: DC | PRN
  Administered 2023-08-05: 100 mg via INTRAVENOUS

## 2023-08-05 MED ORDER — ORAL CARE MOUTH RINSE: 15.0000 mL | Freq: Once | OROMUCOSAL | Status: AC

## 2023-08-05 MED ORDER — ROCURONIUM BROMIDE 10 MG/ML (PF) SYRINGE
PREFILLED_SYRINGE | INTRAVENOUS | Status: DC | PRN
  Administered 2023-08-05: 70 mg via INTRAVENOUS
  Administered 2023-08-05: 10 mg via INTRAVENOUS

## 2023-08-05 MED ORDER — ONDANSETRON HCL 4 MG/2ML IJ SOLN
INTRAMUSCULAR | Status: DC | PRN
  Administered 2023-08-05: 4 mg via INTRAVENOUS

## 2023-08-05 MED ORDER — ACETAMINOPHEN 500 MG PO TABS
1000.0000 mg | ORAL_TABLET | ORAL | Status: AC
  Administered 2023-08-05: 1000 mg via ORAL
  Filled 2023-08-05: qty 2

## 2023-08-05 MED ORDER — PHENYLEPHRINE 80 MCG/ML (10ML) SYRINGE FOR IV PUSH (FOR BLOOD PRESSURE SUPPORT)
PREFILLED_SYRINGE | INTRAVENOUS | Status: DC | PRN
  Administered 2023-08-05: 8 ug via INTRAVENOUS
  Administered 2023-08-05: 80 ug via INTRAVENOUS

## 2023-08-05 MED ORDER — BUPIVACAINE LIPOSOME 1.3 % IJ SUSP
INTRAMUSCULAR | Status: AC
Start: 2023-08-05 — End: ?
  Filled 2023-08-05: qty 20

## 2023-08-05 MED ORDER — MIDAZOLAM HCL 2 MG/2ML IJ SOLN
INTRAMUSCULAR | Status: AC
  Filled 2023-08-05: qty 2

## 2023-08-05 MED ORDER — OXYCODONE HCL 5 MG/5ML PO SOLN: 5.0000 mg | Freq: Once | ORAL | Status: AC | PRN

## 2023-08-05 MED ORDER — PROPOFOL 10 MG/ML IV BOLUS
INTRAVENOUS | Status: AC
  Filled 2023-08-05: qty 20

## 2023-08-05 MED ORDER — CHLORHEXIDINE GLUCONATE 0.12 % MT SOLN
15.0000 mL | Freq: Once | OROMUCOSAL | Status: AC
Start: 2023-08-05 — End: 2023-08-05
  Administered 2023-08-05: 15 mL via OROMUCOSAL
  Filled 2023-08-05: qty 15

## 2023-08-05 MED ORDER — MIDAZOLAM HCL 2 MG/2ML IJ SOLN
INTRAMUSCULAR | Status: DC | PRN
  Administered 2023-08-05: 2 mg via INTRAVENOUS

## 2023-08-05 MED ORDER — FENTANYL CITRATE (PF) 250 MCG/5ML IJ SOLN
INTRAMUSCULAR | Status: DC | PRN
  Administered 2023-08-05: 100 ug via INTRAVENOUS
  Administered 2023-08-05: 50 ug via INTRAVENOUS

## 2023-08-05 MED ORDER — HYDROMORPHONE HCL 1 MG/ML IJ SOLN
INTRAMUSCULAR | Status: DC | PRN
  Administered 2023-08-05: .5 mg via INTRAVENOUS

## 2023-08-05 MED ORDER — DEXAMETHASONE SODIUM PHOSPHATE 10 MG/ML IJ SOLN
INTRAMUSCULAR | Status: AC
  Filled 2023-08-05: qty 4

## 2023-08-05 MED ORDER — LIDOCAINE 2% (20 MG/ML) 5 ML SYRINGE
INTRAMUSCULAR | Status: AC
  Filled 2023-08-05: qty 5

## 2023-08-05 MED ORDER — ONDANSETRON HCL 4 MG/2ML IJ SOLN
INTRAMUSCULAR | Status: AC
  Filled 2023-08-05: qty 8

## 2023-08-05 MED ORDER — 0.9 % SODIUM CHLORIDE (POUR BTL) OPTIME
TOPICAL | Status: DC | PRN
  Administered 2023-08-05: 1000 mL

## 2023-08-05 MED ORDER — BUPIVACAINE-EPINEPHRINE (PF) 0.25% -1:200000 IJ SOLN
INTRAMUSCULAR | Status: DC | PRN
  Administered 2023-08-05: 30 mL

## 2023-08-05 MED ORDER — HYDROCODONE-ACETAMINOPHEN 5-325 MG PO TABS: 1.0000 | ORAL_TABLET | Freq: Four times a day (QID) | ORAL | 0 refills | Status: AC | PRN

## 2023-08-05 MED ORDER — DEXAMETHASONE SODIUM PHOSPHATE 10 MG/ML IJ SOLN
INTRAMUSCULAR | Status: DC | PRN
  Administered 2023-08-05: 4 mg via INTRAVENOUS

## 2023-08-05 MED ORDER — ROCURONIUM BROMIDE 10 MG/ML (PF) SYRINGE
PREFILLED_SYRINGE | INTRAVENOUS | Status: AC
  Filled 2023-08-05: qty 10

## 2023-08-05 SURGICAL SUPPLY — 46 items
BAG COUNTER SPONGE SURGICOUNT (BAG) ×3 IMPLANT
BINDER ABDOMINAL 12 ML 46-62 (SOFTGOODS) IMPLANT
CANISTER SUCT 3000ML PPV (MISCELLANEOUS) IMPLANT
CHLORAPREP W/TINT 26 (MISCELLANEOUS) ×3 IMPLANT
COVER SURGICAL LIGHT HANDLE (MISCELLANEOUS) ×3 IMPLANT
DERMABOND ADVANCED .7 DNX12 (GAUZE/BANDAGES/DRESSINGS) ×4 IMPLANT
DEVICE SECURE STRAP 25 ABSORB (INSTRUMENTS) IMPLANT
DRAPE INCISE IOBAN 66X45 STRL (DRAPES) ×3 IMPLANT
ELECT CAUTERY BLADE 6.4 (BLADE) IMPLANT
ELECT REM PT RETURN 9FT ADLT (ELECTROSURGICAL) ×2
ELECTRODE REM PT RTRN 9FT ADLT (ELECTROSURGICAL) ×3 IMPLANT
GLOVE BIO SURGEON STRL SZ 6.5 (GLOVE) ×3 IMPLANT
GLOVE BIOGEL PI IND STRL 6 (GLOVE) ×3 IMPLANT
GOWN STRL REUS W/ TWL LRG LVL3 (GOWN DISPOSABLE) ×9 IMPLANT
GRASPER SUT TROCAR 14GX15 (MISCELLANEOUS) ×3 IMPLANT
IRRIG SUCT STRYKERFLOW 2 WTIP (MISCELLANEOUS)
IRRIGATION SUCT STRKRFLW 2 WTP (MISCELLANEOUS) IMPLANT
KIT BASIN OR (CUSTOM PROCEDURE TRAY) ×3 IMPLANT
KIT TURNOVER KIT B (KITS) ×3 IMPLANT
MARKER SKIN DUAL TIP RULER LAB (MISCELLANEOUS) ×3 IMPLANT
MESH VENTRALEX ST 1-7/10 CRC S (Mesh General) ×1 IMPLANT
NDL INSUFFLATION 14GA 120MM (NEEDLE) ×2 IMPLANT
NDL SPNL 22GX3.5 QUINCKE BK (NEEDLE) ×2 IMPLANT
NEEDLE INSUFFLATION 14GA 120MM (NEEDLE) ×2 IMPLANT
NEEDLE SPNL 22GX3.5 QUINCKE BK (NEEDLE) ×2 IMPLANT
NS IRRIG 1000ML POUR BTL (IV SOLUTION) ×3 IMPLANT
PAD ARMBOARD 7.5X6 YLW CONV (MISCELLANEOUS) ×6 IMPLANT
PENCIL BUTTON HOLSTER BLD 10FT (ELECTRODE) ×1 IMPLANT
SCISSORS LAP 5X35 DISP (ENDOMECHANICALS) IMPLANT
SET TUBE SMOKE EVAC HIGH FLOW (TUBING) ×3 IMPLANT
SHEARS HARMONIC ACE PLUS 36CM (ENDOMECHANICALS) IMPLANT
SLEEVE Z-THREAD 5X100MM (TROCAR) ×6 IMPLANT
SUT ETHIBOND 0 MO6 C/R (SUTURE) ×3 IMPLANT
SUT MNCRL AB 4-0 PS2 18 (SUTURE) ×3 IMPLANT
SUT NOVA 1 T20/GS 25DT (SUTURE) ×2 IMPLANT
SUT NOVA NAB GS-21 0 18 T12 DT (SUTURE) ×2 IMPLANT
SUT VIC AB 2-0 SH 27X BRD (SUTURE) ×2 IMPLANT
SUT VIC AB 3-0 SH 27XBRD (SUTURE) ×1 IMPLANT
TOWEL GREEN STERILE (TOWEL DISPOSABLE) ×3 IMPLANT
TOWEL GREEN STERILE FF (TOWEL DISPOSABLE) ×3 IMPLANT
TRAY FOLEY W/BAG SLVR 16FR ST (SET/KITS/TRAYS/PACK) IMPLANT
TRAY LAPAROSCOPIC MC (CUSTOM PROCEDURE TRAY) ×3 IMPLANT
TROCAR 11X100 Z THREAD (TROCAR) IMPLANT
TROCAR Z-THREAD OPTICAL 5X100M (TROCAR) ×3 IMPLANT
WARMER LAPAROSCOPE (MISCELLANEOUS) ×3 IMPLANT
WATER STERILE IRR 1000ML POUR (IV SOLUTION) ×3 IMPLANT

## 2023-08-05 NOTE — H&P (Signed)
[name] is an 67 y.o. female.   Chief Complaint: hernia HPI: [name] is a 67 yo female with a LLQ hernia. She has been having frequent pain at the site and has elected to proceed with repair. She presents today for surgery. Today she is not having pain.    Past Medical History:  Diagnosis Date   Acute diverticulitis 09/10/2012   Anemia    pt states this was just during pregnancy in 1993   Anxiety    Carpal tunnel syndrome    Colitis    Depression    Diverticulosis    Family history of adverse reaction to anesthesia    "sister's BP drops way low" (04/18/2017)  propofol   Frequent UTI    GERD (gastroesophageal reflux disease)    Goiter    Hip bursitis    Hypertension    Hypothyroidism    Migraines    "maybe once/year" (04/18/2017)   Multiple thyroid nodules    Osteoarthritis    "knees" (04/18/2017)   Osteopenia    same level x 15 yrs   Squamous cell carcinoma 03/2017   RLE   Tubular adenoma of colon 04/2011    Past Surgical History:  Procedure Laterality Date   CAUTERY OF TURBINATES  1990's   COLONOSCOPY     COLONOSCOPY     EXCISIONAL HEMORRHOIDECTOMY     GANGLION CYST EXCISION Right 06/25/2013   Procedure: RIGHT ANKLE GANGLION CYST EXCISION;  Surgeon: Toni Arthurs, MD;  Location: [facility];  Service: Orthopedics;  Laterality: Right;   INCISION AND DRAINAGE OF WOUND Right 07/23/2013   Procedure: IRRIGATION AND DEBRIDEMENT OF RIGHT ANKLE;  Surgeon: Toni Arthurs, MD;  Location: [facility];  Service: Orthopedics;  Laterality: Right;  Esmark used for tourniquet--on at 1417, off at 1437.   KNEE ARTHROSCOPY Right 11/2010   NASAL SEPTUM SURGERY  1991   PARTIAL KNEE ARTHROPLASTY Right 04/01/2017   Procedure: UNICOMPARTMENTAL RIGHT KNEE- Medially;  Surgeon: Durene Romans, MD;  Location: [facility];  Service: Orthopedics;  Laterality: Right;  90 mins   PARTIAL KNEE ARTHROPLASTY Left 04/2009   POLYPECTOMY     THYROIDECTOMY  05/08/2012   Procedure:  THYROIDECTOMY;  Surgeon: Velora Heckler, MD;  Location: [facility];  Service: General;  Laterality: N/A;  Total Thyriodectomy   TOTAL ABDOMINAL HYSTERECTOMY  2003   TOTAL ABDOMINAL HYSTERECTOMY W/ BILATERAL SALPINGOOPHORECTOMY   TUBAL LIGATION     WOUND DEBRIDEMENT Right 08/2013   ankle    Family History  Problem Relation Age of Onset   Diabetes Mother    Breast cancer Mother    Heart failure Mother 48   Pulmonary embolism Father 24   Hypertension Sister    Colon cancer Maternal Grandfather    Colon polyps Neg Hx    Esophageal cancer Neg Hx    Stomach cancer Neg Hx    Rectal cancer Neg Hx    Social History:  reports that she has never smoked. She has never used smokeless tobacco. She reports that she does not drink alcohol and does not use drugs.  Allergies:  Allergies  Allergen Reactions   Lisinopril Itching and Palpitations   Cephalexin Hives   Ciprofloxacin Other (See Comments)    Gi upset   Macrobid [Nitrofurantoin Macrocrystal] Hives, Nausea And Vomiting and Other (See Comments)    Light headed   Zithromax [Azithromycin] Other (See Comments)    Gi upset    Tramadol Nausea Only    Medications  Prior to Admission  Medication Sig Dispense Refill   acetaminophen (TYLENOL) 500 MG tablet Take 500 mg by mouth every 6 (six) hours as needed for moderate pain (pain score 4-6).     Acetylcysteine (NAC 600 PO) Take 600 mg by mouth daily.     fluticasone (FLONASE) 50 MCG/ACT nasal spray Place 2 sprays into the nose daily as needed for allergies.      irbesartan-hydrochlorothiazide (AVALIDE) 150-12.5 MG tablet Take 1 tablet by mouth daily.     levothyroxine (SYNTHROID, LEVOTHROID) 175 MCG tablet Take 175 mcg by mouth See admin instructions. Take 175 mcg     LORazepam (ATIVAN) 0.5 MG tablet Take 0.5 mg by mouth 2 (two) times daily as needed for anxiety.      MAGNESIUM PO Take 1 tablet by mouth in the morning, at noon, and at bedtime.     metroNIDAZOLE (METROCREAM) 0.75 % cream Apply 1  application topically 2 (two) times daily.      Multiple Vitamin (MULTIVITAMIN WITH MINERALS) TABS tablet Take 1 tablet by mouth daily.     saccharomyces boulardii (FLORASTOR) 250 MG capsule Take 250 mg by mouth daily.     THEANINE PO Take 1 tablet by mouth daily.     valACYclovir (VALTREX) 1000 MG tablet Take 1,000 mg by mouth 2 (two) times daily as needed (fever blisters).     Vitamin D, Cholecalciferol, 10 MCG (400 UNIT) TABS Take 400 Units by mouth daily.     zolpidem (AMBIEN) 5 MG tablet Take 2.5 mg by mouth at bedtime as needed for sleep.     amoxicillin-clavulanate (AUGMENTIN) 875-125 MG tablet Take 1 tablet by mouth every 12 (twelve) hours. (Patient not taking: Reported on 07/29/2023) 14 tablet 0    Results for orders placed or performed during the hospital encounter of 08/05/23 (from the past 48 hour(s))  I-STAT, chem 8     Status: None   Collection Time: 08/05/23 12:43 PM  Result Value Ref Range   Sodium 139 135 - 145 mmol/L   Potassium 3.6 3.5 - 5.1 mmol/L   Chloride 103 98 - 111 mmol/L   BUN 19 8 - 23 mg/dL   Creatinine, Ser 1.61 0.44 - 1.00 mg/dL   Glucose, Bld 97 70 - 99 mg/dL    Comment: Glucose reference range applies only to samples taken after fasting for at least 8 hours.   Calcium, Ion 1.24 1.15 - 1.40 mmol/L   TCO2 28 22 - 32 mmol/L   Hemoglobin 13.9 12.0 - 15.0 g/dL   HCT 09.6 04.5 - 40.9 %   No results found.  Review of Systems  Blood pressure (!) 151/62, pulse 88, temperature 98.9 F (37.2 C), temperature source Oral, resp. rate 18, height 5\' 6"  (1.676 m), weight 90.1 kg, SpO2 95%. Physical Exam Vitals reviewed.  Constitutional:      General: She is not in acute distress.    Appearance: Normal appearance.  HENT:     Head: Normocephalic and atraumatic.  Eyes:     General: No scleral icterus.    Conjunctiva/sclera: Conjunctivae normal.  Pulmonary:     Effort: Pulmonary effort is normal. No respiratory distress.  Abdominal:     General: There is no  distension.     Palpations: Abdomen is soft.     Tenderness: There is no abdominal tenderness.     Comments: Mass in LLQ abdominal wall, difficult to fully palpate.  Skin:    General: Skin is warm and dry.  Neurological:  General: No focal deficit present.     Mental Status: She is alert and oriented to person, place, and time.      Assessment/Plan 67 yo female with a LLQ abdominal wall hernia. Based on CT this appears to be either a Spigelian hernia or an indirect inguinal hernia. Will localize laparoscopically, then proceed with either laparoscopic or open repair depending on anatomy. Plan for mesh placement. I reviewed the procedure details with the patient and she agrees to proceed with surgery. All questions answered. Plan for discharge home postoperatively.  Fritzi Mandes, MD 08/05/2023, 12:47 PM

## 2023-08-05 NOTE — Op Note (Signed)
Date: 08/05/23  Patient: [name] MRN: [id number]  Preoperative Diagnosis: Spigelian vs left inguinal hernia Postoperative Diagnosis: Ventral incisional hernia  Procedure:  Diagnostic laparoscopy Open ventral hernia repair with mesh underlay  Surgeon: Sophronia Simas, MD Assistant: Violeta Gelinas, MD  EBL: Minimal  Anesthesia: General endotracheal  Specimens: None  Indications: [name] is a 67 yo female who presented with LLQ abdominal pain and a palpable mass. A CT scan confirmed a small fat-containing hernia in the LLQ. After a discussion of the risks and benefits of surgery, she agreed to proceed with repair.  Findings: Small hernia defect in the LLQ, which appeared to be at the lateral corner of the previous Pfannenstiel incision, consistent with an incisional hernia. The hernia defect was measured at 8mm on preoperative CT scan, and after opening the hernia sac intra-op the defect was approximately 1cm in diameter. Repaired with a 4.3cm circular Ventralex mesh underlay.   Procedure details: Informed consent was obtained in the preoperative area prior to the procedure. The patient was brought to the operating room and placed on the table in the supine position. General anesthesia was induced and appropriate lines and drains were placed for intraoperative monitoring. Perioperative antibiotics were administered per SCIP guidelines. The abdomen was prepped and draped in the usual sterile fashion. A pre-procedure timeout was taken verifying patient identity, surgical site and procedure to be performed.  A small infraumbilical skin incision was made, the subcutaneous tissue was bluntly spread, and the umbilical stalk was grasped and elevated. A Veress needle was inserted through the fascia and intraperitoneal placement was confirmed with the saline drop test.  The abdomen was insufflated and a 5 mm Visiport was placed.  The peritoneal cavity was inspected with no evidence of visceral or  vascular injury.  There was a hernia defect in the left lower quadrant lateral to the epigastric vessels, near the left pelvic sidewall.  No hernia defect was appreciable in the right lower quadrant.  The hernia had spontaneously reduced, and there was some mildly thickened omentum adjacent to the mouth of the hernia which appeared to have been previously incarcerated within the hernia, but was viable.  The hernia did not appear to be an inguinal hernia given the lateral trajectory of the sac.  Exteriorly, the previous Pfannenstiel scar was visible in the left lateral corner was palpated.  This directly overlied the location of the hernia, consistent with an incisional hernia.  Because of the small size of the hernia and the location, I opted to cut down directly over the hernia and perform an open repair.   A transverse skin incision was made directly overlying the hernia. The subcutaneous tissue was divided with cautery and the external oblique fascia was exposed.  The hernia defect did not extend through the external oblique fascia.  The external bleak fascia was opened transversely with cautery, and just deep to this the hernia sac was encountered.  The sac was circumferentially dissected out and excised with cautery.  The underlying hernia defect was approximately 1 cm in diameter.  A 4.3 cm circular piece of Ventralex mesh was brought onto the field.  The mesh was secured medially, laterally, and superiorly to the fascia using 0 Novafil sutures.  An inferior suture was not placed as this was on the pelvic sidewall, not far from the sigmoid colon.  The sutures were tied down to secure the mesh in place as an intraperitoneal underlay.  The fascial defect was then closed over the mesh using  0 Novafil figure-of-eight sutures.  The abdomen was again insufflated and the repair was examined laparoscopically.  The mesh lay flat in proper orientation, and on palpating the repair the fascia was completely closed.  The  abdomen was desufflated.  The external oblique fascia was closed with a running 2-0 Vicryl suture.  Scarpa's layer was closed with a running 3-0 Vicryl suture, and in the deep dermis was closed with a running subcuticular 4 Monocryl suture.  The umbilical port was removed, and the skin was closed with a subcuticular 4 Monocryl suture.  Dermabond was applied to both incisions.  The patient tolerated the procedure well with no apparent complications.  All counts were correct x2 at the end of the procedure. The patient was extubated and taken to PACU in stable condition.  Sophronia Simas, MD 08/05/23 3:01 PM

## 2023-08-05 NOTE — Anesthesia Preprocedure Evaluation (Signed)
Anesthesia Evaluation  Patient identified by MRN, date of birth, ID band Patient awake    Reviewed: Allergy & Precautions, NPO status , Patient's Chart, lab work & pertinent test results, reviewed documented beta blocker date and time   History of Anesthesia Complications (+) Family history of anesthesia reactionNegative for: history of anesthetic complications  Airway Mallampati: II  TM Distance: >3 FB     Dental no notable dental hx.    Pulmonary neg sleep apnea, neg COPD, neg PE   breath sounds clear to auscultation       Cardiovascular hypertension, Pt. on medications (-) angina (-) CAD, (-) Past MI, (-) Cardiac Stents and (-) CHF  Rhythm:Regular Rate:Normal     Neuro/Psych  Headaches, neg Seizures PSYCHIATRIC DISORDERS Anxiety Depression     Neuromuscular disease    GI/Hepatic ,GERD  ,,(+) neg Cirrhosis        Endo/Other  Hypothyroidism    Renal/GU Renal disease     Musculoskeletal  (+) Arthritis ,    Abdominal   Peds  Hematology  (+) Blood dyscrasia, anemia   Anesthesia Other Findings   Reproductive/Obstetrics                             Anesthesia Physical Anesthesia Plan  ASA: 2  Anesthesia Plan: General   Post-op Pain Management:    Induction: Intravenous  PONV Risk Score and Plan: 2 and Ondansetron and Dexamethasone  Airway Management Planned: Oral ETT  Additional Equipment:   Intra-op Plan:   Post-operative Plan: Extubation in OR  Informed Consent: I have reviewed the patients History and Physical, chart, labs and discussed the procedure including the risks, benefits and alternatives for the proposed anesthesia with the patient or authorized representative who has indicated his/her understanding and acceptance.     Dental advisory given  Plan Discussed with: CRNA  Anesthesia Plan Comments:        Anesthesia Quick Evaluation

## 2023-08-05 NOTE — Transfer of Care (Signed)
Immediate Anesthesia Transfer of Care Note  Patient: [name]  Procedure(s) Performed: OPEN INCISIONAL HERNIA REPAIR (Abdomen) LAPAROSCOPY DIAGNOSTIC (Abdomen)  Patient Location: PACU  Anesthesia Type:General  Level of Consciousness: awake and alert   Airway & Oxygen Therapy: Patient Spontanous Breathing and Patient connected to nasal cannula oxygen  Post-op Assessment: Report given to RN and Post -op Vital signs reviewed and stable  Post vital signs: Reviewed and stable  Last Vitals:  Vitals Value Taken Time  BP 148/69 08/05/23 1455  Temp    Pulse 85 08/05/23 1458  Resp 15 08/05/23 1458  SpO2 96 % 08/05/23 1458  Vitals shown include unfiled device data.  Last Pain:  Vitals:   08/05/23 1229  TempSrc: Oral  PainSc: 0-No pain         Complications: No notable events documented.

## 2023-08-05 NOTE — Anesthesia Procedure Notes (Signed)
Procedure Name: Intubation Date/Time: 08/05/2023 1:35 PM  Performed by: Sandie Ano, CRNAPre-anesthesia Checklist: Patient identified, Emergency Drugs available, Suction available and Patient being monitored Patient Re-evaluated:Patient Re-evaluated prior to induction Oxygen Delivery Method: Circle System Utilized Preoxygenation: Pre-oxygenation with 100% oxygen Induction Type: IV induction Ventilation: Mask ventilation without difficulty Laryngoscope Size: Mac and 3 Grade View: Grade II Tube type: Oral Number of attempts: 1 Airway Equipment and Method: Stylet and Oral airway Placement Confirmation: ETT inserted through vocal cords under direct vision, positive ETCO2 and breath sounds checked- equal and bilateral Secured at: 22 cm Tube secured with: Tape Dental Injury: Teeth and Oropharynx as per pre-operative assessment

## 2023-08-05 NOTE — Discharge Instructions (Addendum)
[facility] DISCHARGE INSTRUCTIONS  Activity No heavy lifting greater than 15 pounds for 8 weeks after surgery. Ok to shower in 24 hours, but do not bathe or submerge incisions underwater. Do not drive while taking narcotic pain medication. You may drive when you are no longer taking prescription pain medication, you can comfortably wear a seatbelt, and you can safely maneuver your car and apply brakes.  Wound Care Your incisions are covered with skin glue called Dermabond. This will peel off on its own over time. You may shower and allow warm soapy water to run over your incisions. Gently pat dry. Do not submerge your incision underwater until cleared by your surgeon. Monitor your incision for any new redness, tenderness, or drainage. Many patients will experience some swelling and bruising at the incisions.  Ice packs will help.  Swelling and bruising can take several days to resolve.   Medications A  prescription for pain medication may be given to you upon discharge.  Take your pain medication as prescribed, if needed.  If narcotic pain medicine is not needed, then you may take acetaminophen (Tylenol) or ibuprofen (Advil) as needed. It is common to experience some constipation if taking pain medication after surgery.  Increasing fluid intake and taking a stool softener (such as Colace) will usually help or prevent this problem from occurring.  A mild laxative (Milk of Magnesia or Miralax) should be taken according to package directions if there are no bowel movements after 48 hours. Take your usually prescribed medications unless otherwise directed. If you need a refill on your pain medication, please contact your pharmacy.  They will contact our office to request authorization. Prescriptions will not be filled after 5 pm or on weekends.  When to Call us: Fever greater than 100.5 New redness, drainage, or swelling at incision site Severe pain, nausea, or  vomiting Persistent bleeding from incisions  Follow-up You have an appointment scheduled with Dr. Freida Busman on September 05, 2023 at 11:20am. This will be at the [facility] office at [address], [address], [place], [place]. Please arrive at least 15 minutes prior to your scheduled appointment time.  IF YOU HAVE DISABILITY OR FAMILY LEAVE FORMS, YOU MUST BRING THEM TO THE OFFICE FOR PROCESSING.   DO NOT GIVE THEM TO YOUR DOCTOR.  The clinic staff is available to answer your questions during regular business hours.  Please don't hesitate to call and ask to speak to one of the nurses for clinical concerns.  If you have a medical emergency, go to the nearest emergency room or call 911.  A surgeon from [facility] is always on call at the hospital  [address], [place], [place]  [address] ?  [address] [address], [place], [place]   [address] [phone] ? Toll Free: [phone] ? FAX [phone] Web site: [email or web address]      Managing Your Pain After Surgery Without Opioids    Thank you for participating in our program to help patients manage their pain after surgery without opioids. This is part of our effort to provide you with the best care possible, without exposing you or your family to the risk that opioids pose.  What pain can I expect after surgery? You can expect to have some pain after surgery. This is normal. The pain is typically worse the day after surgery, and quickly begins to get better. Many studies have found that many patients are able to manage their pain  after surgery with Over-the-Counter (OTC) medications such as Tylenol and Motrin. If you have a condition that does not allow you to take Tylenol or Motrin, notify your surgical team.  How will I manage my pain? The best strategy for controlling your pain after surgery is around the clock pain control with Tylenol (acetaminophen) and Motrin (ibuprofen or Advil).  Alternating these medications with each other allows you to maximize your pain control. In addition to Tylenol and Motrin, you can use heating pads or ice packs on your incisions to help reduce your pain.  How will I alternate your regular strength over-the-counter pain medication? You will take a dose of pain medication every three hours. Start by taking 650 mg of Tylenol (2 pills of 325 mg) 3 hours later take 600 mg of Motrin (3 pills of 200 mg) 3 hours after taking the Motrin take 650 mg of Tylenol 3 hours after that take 600 mg of Motrin.   - 1 -  See example - if your first dose of Tylenol is at 12:00 PM   12:00 PM Tylenol 650 mg (2 pills of 325 mg)  3:00 PM Motrin 600 mg (3 pills of 200 mg)  6:00 PM Tylenol 650 mg (2 pills of 325 mg)  9:00 PM Motrin 600 mg (3 pills of 200 mg)  Continue alternating every 3 hours   We recommend that you follow this schedule around-the-clock for at least 3 days after surgery, or until you feel that it is no longer needed. Use the table on the last page of this handout to keep track of the medications you are taking. Important: Do not take more than 3000mg  of Tylenol or 3200mg  of Motrin in a 24-hour period. Do not take ibuprofen/Motrin if you have a history of bleeding stomach ulcers, severe kidney disease, &/or actively taking a blood thinner  What if I still have pain? If you have pain that is not controlled with the over-the-counter pain medications (Tylenol and Motrin or Advil) you might have what we call "breakthrough" pain. You will receive a prescription for a small amount of an opioid pain medication such as Oxycodone, Tramadol, or Tylenol with Codeine. Use these opioid pills in the first 24 hours after surgery if you have breakthrough pain. Do not take more than 1 pill every 4-6 hours.  If you still have uncontrolled pain after using all opioid pills, don't hesitate to call our staff using the number provided. We will help make sure you are  managing your pain in the best way possible, and if necessary, we can provide a prescription for additional pain medication.   Day 1    Time  Name of Medication Number of pills taken  Amount of Acetaminophen  Pain Level   Comments  AM PM       AM PM       AM PM       AM PM       AM PM       AM PM       AM PM       AM PM       Total Daily amount of Acetaminophen Do not take more than  3,000 mg per day      Day 2    Time  Name of Medication Number of pills taken  Amount of Acetaminophen  Pain Level   Comments  AM PM       AM PM  AM PM       AM PM       AM PM       AM PM       AM PM       AM PM       Total Daily amount of Acetaminophen Do not take more than  3,000 mg per day      Day 3    Time  Name of Medication Number of pills taken  Amount of Acetaminophen  Pain Level   Comments  AM PM       AM PM       AM PM       AM PM         AM PM       AM PM       AM PM       AM PM       Total Daily amount of Acetaminophen Do not take more than  3,000 mg per day      Day 4    Time  Name of Medication Number of pills taken  Amount of Acetaminophen  Pain Level   Comments  AM PM       AM PM       AM PM       AM PM       AM PM       AM PM       AM PM       AM PM       Total Daily amount of Acetaminophen Do not take more than  3,000 mg per day      Day 5    Time  Name of Medication Number of pills taken  Amount of Acetaminophen  Pain Level   Comments  AM PM       AM PM       AM PM       AM PM       AM PM       AM PM       AM PM       AM PM       Total Daily amount of Acetaminophen Do not take more than  3,000 mg per day      Day 6    Time  Name of Medication Number of pills taken  Amount of Acetaminophen  Pain Level  Comments  AM PM       AM PM       AM PM       AM PM       AM PM       AM PM       AM PM       AM PM       Total Daily amount of Acetaminophen Do not take more than  3,000 mg per day       Day 7    Time  Name of Medication Number of pills taken  Amount of Acetaminophen  Pain Level   Comments  AM PM       AM PM       AM PM       AM PM       AM PM       AM PM       AM PM       AM PM       Total Daily amount of Acetaminophen  Do not take more than  3,000 mg per day        For additional information about how and where to safely dispose of unused opioid medications - [email or web address]  Disclaimer: This document contains information and/or instructional materials adapted from [facility] for the typical patient with your condition. It does not replace medical advice from your health care provider because your experience may differ from that of the typical patient. Talk to your health care provider if you have any questions about this document, your condition or your treatment plan. Adapted from [facility]

## 2023-08-06 ENCOUNTER — Encounter (HOSPITAL_COMMUNITY): Payer: Self-pay | Admitting: Surgery

## 2023-08-06 NOTE — Anesthesia Postprocedure Evaluation (Signed)
Anesthesia Post Note  Patient: [name]  Procedure(s) Performed: OPEN INCISIONAL HERNIA REPAIR (Abdomen) LAPAROSCOPY DIAGNOSTIC (Abdomen)     Anesthesia Type: General Anesthetic complications: no   No notable events documented.  Last Vitals:  Vitals:   08/05/23 1515 08/05/23 1530  BP: 135/65 137/70  Pulse: 75 70  Resp: 11 11  Temp:  36.5 C  SpO2: 95% 96%    Last Pain:  Vitals:   08/05/23 1530  TempSrc:   PainSc: 0-No pain                 Mariann Barter

## 2023-08-10 DIAGNOSIS — F4323 Adjustment disorder with mixed anxiety and depressed mood: Secondary | ICD-10-CM | POA: Diagnosis not present

## 2023-08-16 DIAGNOSIS — H5203 Hypermetropia, bilateral: Secondary | ICD-10-CM | POA: Diagnosis not present

## 2023-08-16 DIAGNOSIS — H524 Presbyopia: Secondary | ICD-10-CM | POA: Diagnosis not present

## 2023-09-05 ENCOUNTER — Other Ambulatory Visit: Payer: Self-pay | Admitting: Surgery

## 2023-09-05 DIAGNOSIS — R1310 Dysphagia, unspecified: Secondary | ICD-10-CM

## 2023-09-05 DIAGNOSIS — Z9889 Other specified postprocedural states: Secondary | ICD-10-CM | POA: Diagnosis not present

## 2023-09-05 DIAGNOSIS — Z8719 Personal history of other diseases of the digestive system: Secondary | ICD-10-CM | POA: Diagnosis not present

## 2023-09-09 DIAGNOSIS — M7711 Lateral epicondylitis, right elbow: Secondary | ICD-10-CM | POA: Diagnosis not present

## 2023-09-10 DIAGNOSIS — F4323 Adjustment disorder with mixed anxiety and depressed mood: Secondary | ICD-10-CM | POA: Diagnosis not present

## 2023-09-13 DIAGNOSIS — M25521 Pain in right elbow: Secondary | ICD-10-CM | POA: Diagnosis not present

## 2023-09-16 DIAGNOSIS — M25521 Pain in right elbow: Secondary | ICD-10-CM | POA: Diagnosis not present

## 2023-09-20 DIAGNOSIS — M25521 Pain in right elbow: Secondary | ICD-10-CM | POA: Diagnosis not present

## 2023-09-23 DIAGNOSIS — M25521 Pain in right elbow: Secondary | ICD-10-CM | POA: Diagnosis not present

## 2023-10-03 ENCOUNTER — Ambulatory Visit
Admission: RE | Admit: 2023-10-03 | Discharge: 2023-10-03 | Disposition: A | Discharge status: Home / Self Care | Payer: Medicare HMO | Source: Ambulatory Visit | Attending: Surgery | Admitting: Surgery

## 2023-10-03 DIAGNOSIS — K219 Gastro-esophageal reflux disease without esophagitis: Secondary | ICD-10-CM | POA: Diagnosis not present

## 2023-10-03 DIAGNOSIS — R1310 Dysphagia, unspecified: Secondary | ICD-10-CM | POA: Diagnosis not present

## 2023-10-03 DIAGNOSIS — K224 Dyskinesia of esophagus: Secondary | ICD-10-CM | POA: Diagnosis not present

## 2023-10-04 DIAGNOSIS — M25521 Pain in right elbow: Secondary | ICD-10-CM | POA: Diagnosis not present

## 2023-10-11 DIAGNOSIS — F4323 Adjustment disorder with mixed anxiety and depressed mood: Secondary | ICD-10-CM | POA: Diagnosis not present

## 2023-12-13 DIAGNOSIS — R350 Frequency of micturition: Secondary | ICD-10-CM | POA: Diagnosis not present

## 2023-12-13 DIAGNOSIS — N3001 Acute cystitis with hematuria: Secondary | ICD-10-CM | POA: Diagnosis not present

## 2023-12-13 DIAGNOSIS — Z6829 Body mass index (BMI) 29.0-29.9, adult: Secondary | ICD-10-CM | POA: Diagnosis not present

## 2023-12-29 DIAGNOSIS — Z6829 Body mass index (BMI) 29.0-29.9, adult: Secondary | ICD-10-CM | POA: Diagnosis not present

## 2023-12-29 DIAGNOSIS — J029 Acute pharyngitis, unspecified: Secondary | ICD-10-CM | POA: Diagnosis not present

## 2023-12-29 DIAGNOSIS — R051 Acute cough: Secondary | ICD-10-CM | POA: Diagnosis not present

## 2023-12-29 DIAGNOSIS — H66002 Acute suppurative otitis media without spontaneous rupture of ear drum, left ear: Secondary | ICD-10-CM | POA: Diagnosis not present

## 2024-01-06 DIAGNOSIS — E669 Obesity, unspecified: Secondary | ICD-10-CM | POA: Diagnosis not present

## 2024-01-06 DIAGNOSIS — G4719 Other hypersomnia: Secondary | ICD-10-CM | POA: Diagnosis not present

## 2024-01-06 DIAGNOSIS — G5601 Carpal tunnel syndrome, right upper limb: Secondary | ICD-10-CM | POA: Diagnosis not present

## 2024-01-06 DIAGNOSIS — J309 Allergic rhinitis, unspecified: Secondary | ICD-10-CM | POA: Diagnosis not present

## 2024-01-06 DIAGNOSIS — R5383 Other fatigue: Secondary | ICD-10-CM | POA: Diagnosis not present

## 2024-01-06 DIAGNOSIS — I10 Essential (primary) hypertension: Secondary | ICD-10-CM | POA: Diagnosis not present

## 2024-01-16 ENCOUNTER — Encounter (HOSPITAL_COMMUNITY): Payer: Self-pay | Admitting: Surgery

## 2024-01-27 DIAGNOSIS — I10 Essential (primary) hypertension: Secondary | ICD-10-CM | POA: Diagnosis not present

## 2024-01-27 DIAGNOSIS — E66811 Obesity, class 1: Secondary | ICD-10-CM | POA: Diagnosis not present

## 2024-01-27 DIAGNOSIS — R0683 Snoring: Secondary | ICD-10-CM | POA: Diagnosis not present

## 2024-01-27 DIAGNOSIS — E6609 Other obesity due to excess calories: Secondary | ICD-10-CM | POA: Diagnosis not present

## 2024-01-27 DIAGNOSIS — E890 Postprocedural hypothyroidism: Secondary | ICD-10-CM | POA: Diagnosis not present

## 2024-01-27 DIAGNOSIS — Z6831 Body mass index (BMI) 31.0-31.9, adult: Secondary | ICD-10-CM | POA: Diagnosis not present

## 2024-01-27 DIAGNOSIS — G479 Sleep disorder, unspecified: Secondary | ICD-10-CM | POA: Diagnosis not present

## 2024-01-27 DIAGNOSIS — F439 Reaction to severe stress, unspecified: Secondary | ICD-10-CM | POA: Diagnosis not present

## 2024-01-27 DIAGNOSIS — G478 Other sleep disorders: Secondary | ICD-10-CM | POA: Diagnosis not present

## 2024-01-27 DIAGNOSIS — F419 Anxiety disorder, unspecified: Secondary | ICD-10-CM | POA: Diagnosis not present

## 2024-01-28 DIAGNOSIS — J029 Acute pharyngitis, unspecified: Secondary | ICD-10-CM | POA: Diagnosis not present

## 2024-02-08 DIAGNOSIS — E669 Obesity, unspecified: Secondary | ICD-10-CM | POA: Diagnosis not present

## 2024-02-08 DIAGNOSIS — E785 Hyperlipidemia, unspecified: Secondary | ICD-10-CM | POA: Diagnosis not present

## 2024-02-08 DIAGNOSIS — E890 Postprocedural hypothyroidism: Secondary | ICD-10-CM | POA: Diagnosis not present

## 2024-02-08 DIAGNOSIS — I10 Essential (primary) hypertension: Secondary | ICD-10-CM | POA: Diagnosis not present

## 2024-02-24 DIAGNOSIS — R21 Rash and other nonspecific skin eruption: Secondary | ICD-10-CM | POA: Diagnosis not present

## 2024-03-14 DIAGNOSIS — I10 Essential (primary) hypertension: Secondary | ICD-10-CM | POA: Diagnosis not present

## 2024-03-14 DIAGNOSIS — N3000 Acute cystitis without hematuria: Secondary | ICD-10-CM | POA: Diagnosis not present

## 2024-03-14 DIAGNOSIS — Z6831 Body mass index (BMI) 31.0-31.9, adult: Secondary | ICD-10-CM | POA: Diagnosis not present

## 2024-03-25 DIAGNOSIS — D485 Neoplasm of uncertain behavior of skin: Secondary | ICD-10-CM | POA: Diagnosis not present

## 2024-03-25 DIAGNOSIS — D2372 Other benign neoplasm of skin of left lower limb, including hip: Secondary | ICD-10-CM | POA: Diagnosis not present

## 2024-03-31 DIAGNOSIS — Z6831 Body mass index (BMI) 31.0-31.9, adult: Secondary | ICD-10-CM | POA: Diagnosis not present

## 2024-03-31 DIAGNOSIS — R350 Frequency of micturition: Secondary | ICD-10-CM | POA: Diagnosis not present

## 2024-03-31 DIAGNOSIS — N3000 Acute cystitis without hematuria: Secondary | ICD-10-CM | POA: Diagnosis not present

## 2024-04-19 DIAGNOSIS — Z6831 Body mass index (BMI) 31.0-31.9, adult: Secondary | ICD-10-CM | POA: Diagnosis not present

## 2024-04-19 DIAGNOSIS — R350 Frequency of micturition: Secondary | ICD-10-CM | POA: Diagnosis not present

## 2024-04-20 ENCOUNTER — Other Ambulatory Visit (HOSPITAL_COMMUNITY): Payer: Self-pay

## 2024-05-26 DIAGNOSIS — E890 Postprocedural hypothyroidism: Secondary | ICD-10-CM | POA: Diagnosis not present

## 2024-05-26 DIAGNOSIS — Z6831 Body mass index (BMI) 31.0-31.9, adult: Secondary | ICD-10-CM | POA: Diagnosis not present

## 2024-05-26 DIAGNOSIS — R0981 Nasal congestion: Secondary | ICD-10-CM | POA: Diagnosis not present

## 2024-05-26 DIAGNOSIS — G4721 Circadian rhythm sleep disorder, delayed sleep phase type: Secondary | ICD-10-CM | POA: Diagnosis not present

## 2024-05-26 DIAGNOSIS — R0683 Snoring: Secondary | ICD-10-CM | POA: Diagnosis not present

## 2024-05-26 DIAGNOSIS — E66811 Obesity, class 1: Secondary | ICD-10-CM | POA: Diagnosis not present

## 2024-05-27 DIAGNOSIS — E890 Postprocedural hypothyroidism: Secondary | ICD-10-CM | POA: Diagnosis not present

## 2024-06-08 ENCOUNTER — Other Ambulatory Visit: Payer: Self-pay | Admitting: Obstetrics and Gynecology

## 2024-06-08 DIAGNOSIS — Z1231 Encounter for screening mammogram for malignant neoplasm of breast: Secondary | ICD-10-CM

## 2024-06-15 DIAGNOSIS — Z01419 Encounter for gynecological examination (general) (routine) without abnormal findings: Secondary | ICD-10-CM | POA: Diagnosis not present

## 2024-06-15 DIAGNOSIS — Z6833 Body mass index (BMI) 33.0-33.9, adult: Secondary | ICD-10-CM | POA: Diagnosis not present

## 2024-06-30 DIAGNOSIS — R8290 Unspecified abnormal findings in urine: Secondary | ICD-10-CM | POA: Diagnosis not present

## 2024-06-30 DIAGNOSIS — R300 Dysuria: Secondary | ICD-10-CM | POA: Diagnosis not present

## 2024-06-30 DIAGNOSIS — R3129 Other microscopic hematuria: Secondary | ICD-10-CM | POA: Diagnosis not present

## 2024-06-30 DIAGNOSIS — R350 Frequency of micturition: Secondary | ICD-10-CM | POA: Diagnosis not present

## 2024-07-03 ENCOUNTER — Ambulatory Visit
Admission: RE | Admit: 2024-07-03 | Discharge: 2024-07-03 | Disposition: A | Discharge status: Home / Self Care | Source: Ambulatory Visit | Attending: Obstetrics and Gynecology | Admitting: Obstetrics and Gynecology

## 2024-07-03 DIAGNOSIS — Z1231 Encounter for screening mammogram for malignant neoplasm of breast: Secondary | ICD-10-CM

## 2024-07-06 DIAGNOSIS — Z1331 Encounter for screening for depression: Secondary | ICD-10-CM | POA: Diagnosis not present

## 2024-07-06 DIAGNOSIS — Z6833 Body mass index (BMI) 33.0-33.9, adult: Secondary | ICD-10-CM | POA: Diagnosis not present

## 2024-07-06 DIAGNOSIS — E66811 Obesity, class 1: Secondary | ICD-10-CM | POA: Diagnosis not present

## 2024-07-06 DIAGNOSIS — I10 Essential (primary) hypertension: Secondary | ICD-10-CM | POA: Diagnosis not present

## 2024-07-06 DIAGNOSIS — R0683 Snoring: Secondary | ICD-10-CM | POA: Diagnosis not present

## 2024-07-06 DIAGNOSIS — E6609 Other obesity due to excess calories: Secondary | ICD-10-CM | POA: Diagnosis not present

## 2024-07-06 DIAGNOSIS — F5101 Primary insomnia: Secondary | ICD-10-CM | POA: Diagnosis not present

## 2024-07-09 ENCOUNTER — Other Ambulatory Visit: Payer: Self-pay | Admitting: Obstetrics and Gynecology

## 2024-07-09 DIAGNOSIS — R928 Other abnormal and inconclusive findings on diagnostic imaging of breast: Secondary | ICD-10-CM

## 2024-07-20 ENCOUNTER — Ambulatory Visit
Admission: RE | Admit: 2024-07-20 | Discharge: 2024-07-20 | Disposition: A | Discharge status: Home / Self Care | Source: Ambulatory Visit | Attending: Obstetrics and Gynecology | Admitting: Obstetrics and Gynecology

## 2024-07-20 DIAGNOSIS — R928 Other abnormal and inconclusive findings on diagnostic imaging of breast: Secondary | ICD-10-CM

## 2024-07-20 DIAGNOSIS — Z6833 Body mass index (BMI) 33.0-33.9, adult: Secondary | ICD-10-CM | POA: Diagnosis not present

## 2024-07-20 DIAGNOSIS — R6889 Other general symptoms and signs: Secondary | ICD-10-CM | POA: Diagnosis not present

## 2024-07-20 DIAGNOSIS — E039 Hypothyroidism, unspecified: Secondary | ICD-10-CM | POA: Diagnosis not present

## 2024-07-20 DIAGNOSIS — R0683 Snoring: Secondary | ICD-10-CM | POA: Diagnosis not present

## 2024-07-20 DIAGNOSIS — F5101 Primary insomnia: Secondary | ICD-10-CM | POA: Diagnosis not present

## 2024-07-20 DIAGNOSIS — R7303 Prediabetes: Secondary | ICD-10-CM | POA: Diagnosis not present

## 2024-07-20 DIAGNOSIS — E6609 Other obesity due to excess calories: Secondary | ICD-10-CM | POA: Diagnosis not present

## 2024-07-20 DIAGNOSIS — E559 Vitamin D deficiency, unspecified: Secondary | ICD-10-CM | POA: Diagnosis not present

## 2024-07-20 DIAGNOSIS — E66811 Obesity, class 1: Secondary | ICD-10-CM | POA: Diagnosis not present

## 2024-07-20 DIAGNOSIS — I10 Essential (primary) hypertension: Secondary | ICD-10-CM | POA: Diagnosis not present

## 2024-07-20 DIAGNOSIS — N6489 Other specified disorders of breast: Secondary | ICD-10-CM | POA: Diagnosis not present

## 2024-07-22 DIAGNOSIS — J343 Hypertrophy of nasal turbinates: Secondary | ICD-10-CM | POA: Diagnosis not present

## 2024-07-27 DIAGNOSIS — E890 Postprocedural hypothyroidism: Secondary | ICD-10-CM | POA: Diagnosis not present

## 2024-07-27 DIAGNOSIS — I10 Essential (primary) hypertension: Secondary | ICD-10-CM | POA: Diagnosis not present

## 2024-07-27 DIAGNOSIS — Z6833 Body mass index (BMI) 33.0-33.9, adult: Secondary | ICD-10-CM | POA: Diagnosis not present

## 2024-07-27 DIAGNOSIS — E6609 Other obesity due to excess calories: Secondary | ICD-10-CM | POA: Diagnosis not present

## 2024-07-27 DIAGNOSIS — E66811 Obesity, class 1: Secondary | ICD-10-CM | POA: Diagnosis not present

## 2024-07-31 DIAGNOSIS — L570 Actinic keratosis: Secondary | ICD-10-CM | POA: Diagnosis not present

## 2024-07-31 DIAGNOSIS — L821 Other seborrheic keratosis: Secondary | ICD-10-CM | POA: Diagnosis not present

## 2024-07-31 DIAGNOSIS — D1801 Hemangioma of skin and subcutaneous tissue: Secondary | ICD-10-CM | POA: Diagnosis not present

## 2024-07-31 DIAGNOSIS — Z85828 Personal history of other malignant neoplasm of skin: Secondary | ICD-10-CM | POA: Diagnosis not present

## 2024-07-31 DIAGNOSIS — Z08 Encounter for follow-up examination after completed treatment for malignant neoplasm: Secondary | ICD-10-CM | POA: Diagnosis not present

## 2024-07-31 DIAGNOSIS — L820 Inflamed seborrheic keratosis: Secondary | ICD-10-CM | POA: Diagnosis not present

## 2024-07-31 DIAGNOSIS — D485 Neoplasm of uncertain behavior of skin: Secondary | ICD-10-CM | POA: Diagnosis not present

## 2024-07-31 DIAGNOSIS — M8588 Other specified disorders of bone density and structure, other site: Secondary | ICD-10-CM | POA: Diagnosis not present

## 2024-07-31 DIAGNOSIS — L738 Other specified follicular disorders: Secondary | ICD-10-CM | POA: Diagnosis not present

## 2024-07-31 DIAGNOSIS — L538 Other specified erythematous conditions: Secondary | ICD-10-CM | POA: Diagnosis not present

## 2024-07-31 DIAGNOSIS — L814 Other melanin hyperpigmentation: Secondary | ICD-10-CM | POA: Diagnosis not present

## 2024-08-03 DIAGNOSIS — E66811 Obesity, class 1: Secondary | ICD-10-CM | POA: Diagnosis not present

## 2024-08-03 DIAGNOSIS — F5101 Primary insomnia: Secondary | ICD-10-CM | POA: Diagnosis not present

## 2024-08-03 DIAGNOSIS — R7303 Prediabetes: Secondary | ICD-10-CM | POA: Diagnosis not present

## 2024-08-03 DIAGNOSIS — E039 Hypothyroidism, unspecified: Secondary | ICD-10-CM | POA: Diagnosis not present

## 2024-08-03 DIAGNOSIS — Z6833 Body mass index (BMI) 33.0-33.9, adult: Secondary | ICD-10-CM | POA: Diagnosis not present

## 2024-08-03 DIAGNOSIS — E890 Postprocedural hypothyroidism: Secondary | ICD-10-CM | POA: Diagnosis not present

## 2024-08-03 DIAGNOSIS — I10 Essential (primary) hypertension: Secondary | ICD-10-CM | POA: Diagnosis not present

## 2024-08-03 DIAGNOSIS — R0683 Snoring: Secondary | ICD-10-CM | POA: Diagnosis not present

## 2024-08-03 DIAGNOSIS — E559 Vitamin D deficiency, unspecified: Secondary | ICD-10-CM | POA: Diagnosis not present

## 2024-08-03 DIAGNOSIS — R6889 Other general symptoms and signs: Secondary | ICD-10-CM | POA: Diagnosis not present

## 2024-08-03 DIAGNOSIS — E6609 Other obesity due to excess calories: Secondary | ICD-10-CM | POA: Diagnosis not present

## 2024-08-04 DIAGNOSIS — Z6831 Body mass index (BMI) 31.0-31.9, adult: Secondary | ICD-10-CM | POA: Diagnosis not present

## 2024-08-04 DIAGNOSIS — R350 Frequency of micturition: Secondary | ICD-10-CM | POA: Diagnosis not present

## 2024-08-04 DIAGNOSIS — N3000 Acute cystitis without hematuria: Secondary | ICD-10-CM | POA: Diagnosis not present

## 2024-08-21 DIAGNOSIS — H5203 Hypermetropia, bilateral: Secondary | ICD-10-CM | POA: Diagnosis not present

## 2024-08-21 DIAGNOSIS — H524 Presbyopia: Secondary | ICD-10-CM | POA: Diagnosis not present

## 2024-08-25 DIAGNOSIS — J343 Hypertrophy of nasal turbinates: Secondary | ICD-10-CM | POA: Diagnosis not present
